# Patient Record
Sex: Female | Born: 1965 | Race: White | Hispanic: No | Smoking: Never smoker
Health system: Southern US, Community
[De-identification: ages and names within clinical notes are randomized; demographics above are authoritative.]

## PROBLEM LIST (undated history)

## (undated) DIAGNOSIS — F603 Borderline personality disorder: Secondary | ICD-10-CM

## (undated) DIAGNOSIS — M25559 Pain in unspecified hip: Secondary | ICD-10-CM

## (undated) DIAGNOSIS — F419 Anxiety disorder, unspecified: Secondary | ICD-10-CM

## (undated) DIAGNOSIS — R413 Other amnesia: Secondary | ICD-10-CM

## (undated) DIAGNOSIS — H9319 Tinnitus, unspecified ear: Secondary | ICD-10-CM

## (undated) DIAGNOSIS — M1990 Unspecified osteoarthritis, unspecified site: Secondary | ICD-10-CM

## (undated) DIAGNOSIS — T7840XA Allergy, unspecified, initial encounter: Secondary | ICD-10-CM

## (undated) DIAGNOSIS — Z8619 Personal history of other infectious and parasitic diseases: Secondary | ICD-10-CM

## (undated) DIAGNOSIS — K5100 Ulcerative (chronic) pancolitis without complications: Secondary | ICD-10-CM

## (undated) DIAGNOSIS — J449 Chronic obstructive pulmonary disease, unspecified: Secondary | ICD-10-CM

## (undated) DIAGNOSIS — R41840 Attention and concentration deficit: Secondary | ICD-10-CM

## (undated) DIAGNOSIS — M2550 Pain in unspecified joint: Secondary | ICD-10-CM

## (undated) DIAGNOSIS — F329 Major depressive disorder, single episode, unspecified: Secondary | ICD-10-CM

## (undated) DIAGNOSIS — F32A Depression, unspecified: Secondary | ICD-10-CM

## (undated) DIAGNOSIS — M549 Dorsalgia, unspecified: Secondary | ICD-10-CM

## (undated) DIAGNOSIS — R600 Localized edema: Secondary | ICD-10-CM

## (undated) DIAGNOSIS — C4491 Basal cell carcinoma of skin, unspecified: Secondary | ICD-10-CM

## (undated) DIAGNOSIS — E559 Vitamin D deficiency, unspecified: Secondary | ICD-10-CM

## (undated) DIAGNOSIS — F50819 Binge eating disorder, unspecified: Secondary | ICD-10-CM

## (undated) DIAGNOSIS — F5081 Binge eating disorder: Secondary | ICD-10-CM

## (undated) DIAGNOSIS — J302 Other seasonal allergic rhinitis: Secondary | ICD-10-CM

## (undated) HISTORY — DX: Major depressive disorder, single episode, unspecified: F32.9

## (undated) HISTORY — DX: Binge eating disorder: F50.81

## (undated) HISTORY — DX: Anxiety disorder, unspecified: F41.9

## (undated) HISTORY — DX: Localized edema: R60.0

## (undated) HISTORY — DX: Chronic obstructive pulmonary disease, unspecified: J44.9

## (undated) HISTORY — DX: Depression, unspecified: F32.A

## (undated) HISTORY — DX: Binge eating disorder, unspecified: F50.819

## (undated) HISTORY — DX: Attention and concentration deficit: R41.840

## (undated) HISTORY — DX: Tinnitus, unspecified ear: H93.19

## (undated) HISTORY — DX: Ulcerative (chronic) pancolitis without complications: K51.00

## (undated) HISTORY — DX: Unspecified osteoarthritis, unspecified site: M19.90

## (undated) HISTORY — PX: COLONOSCOPY: SHX174

## (undated) HISTORY — DX: Pain in unspecified joint: M25.50

## (undated) HISTORY — DX: Allergy, unspecified, initial encounter: T78.40XA

## (undated) HISTORY — DX: Pain in unspecified hip: M25.559

## (undated) HISTORY — DX: Personal history of other infectious and parasitic diseases: Z86.19

## (undated) HISTORY — DX: Dorsalgia, unspecified: M54.9

## (undated) HISTORY — PX: CHOLECYSTECTOMY: SHX55

## (undated) HISTORY — DX: Basal cell carcinoma of skin, unspecified: C44.91

## (undated) HISTORY — DX: Vitamin D deficiency, unspecified: E55.9

## (undated) HISTORY — DX: Borderline personality disorder: F60.3

## (undated) HISTORY — DX: Other amnesia: R41.3

## (undated) HISTORY — DX: Other seasonal allergic rhinitis: J30.2

---

## 2001-01-11 HISTORY — PX: CHOLECYSTECTOMY: SHX55

## 2007-06-22 ENCOUNTER — Ambulatory Visit: Payer: Self-pay | Admitting: Family Medicine

## 2009-08-20 ENCOUNTER — Ambulatory Visit: Payer: Self-pay | Admitting: Family Medicine

## 2009-09-01 ENCOUNTER — Ambulatory Visit: Payer: Self-pay | Admitting: Family Medicine

## 2011-03-11 ENCOUNTER — Ambulatory Visit: Payer: Self-pay | Admitting: Family Medicine

## 2011-03-22 ENCOUNTER — Ambulatory Visit: Payer: Self-pay | Admitting: Family Medicine

## 2012-03-13 ENCOUNTER — Ambulatory Visit: Payer: Self-pay | Admitting: Family Medicine

## 2014-07-02 ENCOUNTER — Other Ambulatory Visit: Payer: Self-pay | Admitting: Family Medicine

## 2014-07-02 DIAGNOSIS — Z1231 Encounter for screening mammogram for malignant neoplasm of breast: Secondary | ICD-10-CM

## 2014-07-16 ENCOUNTER — Ambulatory Visit: Payer: Self-pay

## 2014-07-22 ENCOUNTER — Ambulatory Visit
Admission: RE | Admit: 2014-07-22 | Discharge: 2014-07-22 | Disposition: A | Discharge status: Home / Self Care | Payer: BLUE CROSS/BLUE SHIELD | Source: Ambulatory Visit | Attending: Family Medicine | Admitting: Family Medicine

## 2014-07-22 DIAGNOSIS — Z1231 Encounter for screening mammogram for malignant neoplasm of breast: Secondary | ICD-10-CM

## 2014-07-23 ENCOUNTER — Other Ambulatory Visit: Payer: Self-pay | Admitting: Family Medicine

## 2014-07-23 DIAGNOSIS — N6489 Other specified disorders of breast: Secondary | ICD-10-CM

## 2014-07-23 DIAGNOSIS — R928 Other abnormal and inconclusive findings on diagnostic imaging of breast: Secondary | ICD-10-CM

## 2014-07-25 ENCOUNTER — Ambulatory Visit
Admission: RE | Admit: 2014-07-25 | Discharge: 2014-07-25 | Disposition: A | Discharge status: Home / Self Care | Payer: BLUE CROSS/BLUE SHIELD | Source: Ambulatory Visit | Attending: Family Medicine | Admitting: Family Medicine

## 2014-07-25 ENCOUNTER — Ambulatory Visit: Payer: BLUE CROSS/BLUE SHIELD

## 2014-07-25 DIAGNOSIS — N6489 Other specified disorders of breast: Secondary | ICD-10-CM

## 2014-07-25 DIAGNOSIS — R928 Other abnormal and inconclusive findings on diagnostic imaging of breast: Secondary | ICD-10-CM | POA: Diagnosis not present

## 2017-04-15 ENCOUNTER — Ambulatory Visit: Payer: BLUE CROSS/BLUE SHIELD | Admitting: Physician Assistant

## 2017-04-15 ENCOUNTER — Encounter: Payer: Self-pay | Admitting: Physician Assistant

## 2017-04-15 VITALS — BP 129/86 | HR 91 | Temp 98.3°F | Resp 17 | Ht 67.0 in | Wt 268.0 lb

## 2017-04-15 DIAGNOSIS — R6883 Chills (without fever): Secondary | ICD-10-CM | POA: Diagnosis not present

## 2017-04-15 DIAGNOSIS — R05 Cough: Secondary | ICD-10-CM

## 2017-04-15 DIAGNOSIS — R6889 Other general symptoms and signs: Secondary | ICD-10-CM | POA: Diagnosis not present

## 2017-04-15 DIAGNOSIS — R059 Cough, unspecified: Secondary | ICD-10-CM

## 2017-04-15 LAB — POC INFLUENZA A&B (BINAX/QUICKVUE)
Influenza A, POC: NEGATIVE
Influenza B, POC: NEGATIVE

## 2017-04-15 MED ORDER — AZITHROMYCIN 250 MG PO TABS: ORAL_TABLET | ORAL | 0 refills | Status: DC

## 2017-04-15 MED ORDER — HYDROCOD POLST-CPM POLST ER 10-8 MG/5ML PO SUER: 5.0000 mL | Freq: Two times a day (BID) | ORAL | 0 refills | Status: DC | PRN

## 2017-04-15 MED ORDER — MUCINEX DM MAXIMUM STRENGTH 60-1200 MG PO TB12: 1.0000 | ORAL_TABLET | Freq: Two times a day (BID) | ORAL | 1 refills | Status: DC

## 2017-04-15 NOTE — Patient Instructions (Addendum)
  Start taking Mucinex every 12 hours for the next 4-5 days.  Tussionex may make you drowsy - take this only as directed.  You may fill Azithromycin on Monday only if your symptoms are worsening.   Stay well hydrated. Get lost of rest. Wash your hands often.   -Foods that can help speed recovery: honey, garlic, chicken soup, elderberries, green tea.  -Supplements that can help speed recovery: vitamin C, zinc, elderberry extract, quercetin, ginseng, selenium -Supplement with prebiotics and probiotics.   Advil or ibuprofen for pain. Do not take Aspirin.  Drink enough water and fluids to keep your urine clear or pale yellow.  For sore throat try using a honey-based tea. Use 3 teaspoons of honey with juice squeezed from half lemon. Place shaved pieces of ginger into 1/2-1 cup of water and warm over stove top. Then mix the ingredients and repeat every 4 hours as needed.  Cough Syrup Recipe: Sweet Lemon & Honey Thyme  Ingredients a handful of fresh thyme sprigs   1 pint of water (2 cups)  1/2 cup honey (raw is best, but regular will do)  1/2 lemon chopped Instructions 1. Place the lemon in the pint jar and cover with the honey. The honey will macerate the lemons and draw out liquids which taste so delicious! 2. Meanwhile, toss the thyme leaves into a saucepan and cover them with the water. 3. Bring the water to a gentle simmer and reduce it to half, about a cup of tea. 4. When the tea is reduced and cooled a bit, strain the sprigs & leaves, add it into the pint jar and stir it well. 5. Give it a shake and use a spoonful as needed. 6. Store your homemade cough syrup in the refrigerator for about a month.  Is there anything I can do on my own to get rid of my cough? Yes. To help get rid of your cough, you can: ?Use a humidifier in your bedroom ?Use an over-the-counter cough medicine, or suck on cough drops or hard candy ?Stop smoking, if you smoke ?If you have allergies, avoid the things  you are allergic to (like pollen, dust, animals, or mold) If you have acid reflux, your doctor or nurse will tell you which lifestyle changes can help reduce symptoms.  Thank you for coming in today. I hope you feel we met your needs.  Feel free to call PCP if you have any questions or further requests.  Please consider signing up for MyChart if you do not already have it, as this is a great way to communicate with me.  Best,  Whitney McVey, PA-C    IF you received an x-ray today, you will receive an invoice from [facility]. Please contact [facility] at [phone] with questions or concerns regarding your invoice.   IF you received labwork today, you will receive an invoice from [place]. Please contact [facility] at [phone] with questions or concerns regarding your invoice.   Our billing staff will not be able to assist you with questions regarding bills from these companies.  You will be contacted with the lab results as soon as they are available. The fastest way to get your results is to activate your My Chart account. Instructions are located on the last page of this paperwork. If you have not heard from [name] regarding the results in 2 weeks, please contact this office.

## 2017-04-15 NOTE — Progress Notes (Signed)
   Leslie Douglas  MRN: [id number] DOB: Dec 10, 1965  PCP: Patient, No Pcp Per  Subjective:  Pt is a 52 year old female presents to clinic for cold symptoms x 5 days. C/o cough, runny nose, fatigue, chills and sweats. Symptoms worsened yesterday. Chills last night. She has taken cold and flu otc medications, delsym. She has lots of sick contacts at work.   Review of Systems  Constitutional: Positive for chills and fatigue. Negative for diaphoresis and fever.  HENT: Positive for congestion, rhinorrhea, sinus pressure and sore throat. Negative for postnasal drip and sinus pain.   Respiratory: Positive for cough. Negative for shortness of breath and wheezing.   Psychiatric/Behavioral: Negative for sleep disturbance.    There are no active problems to display for this patient.   No current outpatient medications on file prior to visit.   No current facility-administered medications on file prior to visit.     Allergies  Allergen Reactions  . Erythromycin Shortness Of Breath  . Sulfa Antibiotics Shortness Of Breath  . Asa [Aspirin] Swelling  . Penicillins Rash     Objective:  BP 129/86   Pulse 91   Temp 98.3 F (36.8 C) (Oral)   Resp 17   Ht 5' 7"  (1.702 m)   Wt 268 lb (121.6 kg)   SpO2 98%   BMI 41.97 kg/m   Physical Exam  Constitutional: She is oriented to person, place, and time and well-developed, well-nourished, and in no distress. No distress.  HENT:  Right Ear: Tympanic membrane normal.  Left Ear: Tympanic membrane normal.  Nose: Mucosal edema present. No rhinorrhea. Right sinus exhibits no maxillary sinus tenderness and no frontal sinus tenderness. Left sinus exhibits no maxillary sinus tenderness and no frontal sinus tenderness.  Mouth/Throat: Oropharynx is clear and moist and mucous membranes are normal.  Cardiovascular: Normal rate, regular rhythm and normal heart sounds.  Pulmonary/Chest: Effort normal and breath sounds normal. No respiratory distress. She  has no wheezes. She has no rales.  Neurological: She is alert and oriented to person, place, and time. GCS score is 15.  Skin: Skin is warm and dry.  Psychiatric: Mood, memory, affect and judgment normal.  Vitals reviewed.  Results for orders placed or performed in visit on 04/15/17  POC Influenza A&B(BINAX/QUICKVUE)  Result Value Ref Range   Influenza A, POC Negative Negative   Influenza B, POC Negative Negative   Assessment and Plan :  1. Cough 2. Chills 3. Flu-like symptoms - POC Influenza A&B(BINAX/QUICKVUE) - chlorpheniramine-HYDROcodone (TUSSIONEX PENNKINETIC ER) 10-8 MG/5ML SUER; Take 5 mLs by mouth every 12 (twelve) hours as needed for cough.  Dispense: 100 mL; Refill: 0 - Dextromethorphan-guaiFENesin (MUCINEX DM MAXIMUM STRENGTH) 60-1200 MG TB12; Take 1 tablet by mouth every 12 (twelve) hours.  Dispense: 14 each; Refill: 1 - azithromycin (ZITHROMAX) 250 MG tablet; Take 2 tabs PO x 1 dose, then 1 tab PO QD x 4 days  Dispense: 6 tablet; Refill: 0 - Pt c/o flu-like symptoms x 5 days. Rapid flu is negative. Plan to treat supportively over the next 3 days. She may fill Rx for azithromycin if symptoms worsen. Stay well hydrated. RTC PRN. She understands and agrees with plan.   Mercer Pod, PA-C  [facility] [place] 04/15/2017 12:09 PM

## 2017-10-11 HISTORY — PX: MOHS SURGERY: SUR867

## 2017-10-20 ENCOUNTER — Ambulatory Visit: Payer: BLUE CROSS/BLUE SHIELD | Admitting: Physician Assistant

## 2017-10-20 ENCOUNTER — Other Ambulatory Visit: Payer: Self-pay

## 2017-10-20 ENCOUNTER — Encounter: Payer: Self-pay | Admitting: Physician Assistant

## 2017-10-20 ENCOUNTER — Other Ambulatory Visit: Payer: Self-pay | Admitting: Physician Assistant

## 2017-10-20 VITALS — BP 136/85 | HR 86 | Temp 98.0°F | Resp 18 | Ht 67.0 in | Wt 282.6 lb

## 2017-10-20 DIAGNOSIS — F50819 Binge eating disorder, unspecified: Secondary | ICD-10-CM | POA: Insufficient documentation

## 2017-10-20 DIAGNOSIS — Z1329 Encounter for screening for other suspected endocrine disorder: Secondary | ICD-10-CM | POA: Diagnosis not present

## 2017-10-20 DIAGNOSIS — C4491 Basal cell carcinoma of skin, unspecified: Secondary | ICD-10-CM | POA: Insufficient documentation

## 2017-10-20 DIAGNOSIS — Z1321 Encounter for screening for nutritional disorder: Secondary | ICD-10-CM | POA: Diagnosis not present

## 2017-10-20 DIAGNOSIS — Z130 Encounter for screening for diseases of the blood and blood-forming organs and certain disorders involving the immune mechanism: Secondary | ICD-10-CM

## 2017-10-20 DIAGNOSIS — Z0000 Encounter for general adult medical examination without abnormal findings: Secondary | ICD-10-CM

## 2017-10-20 DIAGNOSIS — N898 Other specified noninflammatory disorders of vagina: Secondary | ICD-10-CM | POA: Diagnosis not present

## 2017-10-20 DIAGNOSIS — Z1211 Encounter for screening for malignant neoplasm of colon: Secondary | ICD-10-CM

## 2017-10-20 DIAGNOSIS — Z124 Encounter for screening for malignant neoplasm of cervix: Secondary | ICD-10-CM | POA: Diagnosis not present

## 2017-10-20 DIAGNOSIS — Z1231 Encounter for screening mammogram for malignant neoplasm of breast: Secondary | ICD-10-CM

## 2017-10-20 DIAGNOSIS — A6000 Herpesviral infection of urogenital system, unspecified: Secondary | ICD-10-CM

## 2017-10-20 DIAGNOSIS — F418 Other specified anxiety disorders: Secondary | ICD-10-CM | POA: Insufficient documentation

## 2017-10-20 DIAGNOSIS — Z23 Encounter for immunization: Secondary | ICD-10-CM | POA: Diagnosis not present

## 2017-10-20 DIAGNOSIS — Z13228 Encounter for screening for other metabolic disorders: Secondary | ICD-10-CM

## 2017-10-20 DIAGNOSIS — F5081 Binge eating disorder: Secondary | ICD-10-CM | POA: Insufficient documentation

## 2017-10-20 DIAGNOSIS — H539 Unspecified visual disturbance: Secondary | ICD-10-CM

## 2017-10-20 LAB — POCT WET + KOH PREP
Trich by wet prep: ABSENT
Yeast by KOH: ABSENT
Yeast by wet prep: ABSENT

## 2017-10-20 LAB — POCT URINALYSIS DIP (MANUAL ENTRY)
Bilirubin, UA: NEGATIVE
Glucose, UA: NEGATIVE mg/dL
Ketones, POC UA: NEGATIVE mg/dL
Leukocytes, UA: NEGATIVE
Nitrite, UA: NEGATIVE
Protein Ur, POC: NEGATIVE mg/dL
Spec Grav, UA: 1.015 (ref 1.010–1.025)
Urobilinogen, UA: 0.2 E.U./dL
pH, UA: 5.5 (ref 5.0–8.0)

## 2017-10-20 MED ORDER — METRONIDAZOLE 0.75 % VA GEL: 1.0000 | Freq: Every day | VAGINAL | 0 refills | Status: AC

## 2017-10-20 MED ORDER — VALACYCLOVIR HCL 500 MG PO TABS: 500.0000 mg | ORAL_TABLET | Freq: Two times a day (BID) | ORAL | 2 refills | Status: AC

## 2017-10-20 MED ORDER — ESCITALOPRAM OXALATE 10 MG PO TABS: 10.0000 mg | ORAL_TABLET | Freq: Every day | ORAL | 1 refills | Status: DC

## 2017-10-20 NOTE — Patient Instructions (Addendum)
Your test result is negative for yeast or bacterial vaginosis. If you continue to have symptoms of vaginal irritation, I sent in a prescription for metrogel to your pharmacy. Apply 1 suppository vaginally at night before bed for 5 nights.   1) You will receive a phone call to schedule an appointment for your colonoscopy and to see opthalmologist  2) We recommend that you schedule a mammogram for breast cancer screening. Typically, you do not need a referral to do this. Please contact a local imaging center to schedule your mammogram.  [facility] ([place]) - [phone] or [phone]  [facility] - [phone] [place] [phone] [place] [phone]  *ask for the Radiology Department [place] - [phone]  3) For herpes: Valacyclovir 500 mg twice daily for 3 days   4) For therapy (specifically related to eating disorders): [facility] - [place], [phone]  Nutritionist: [facility]  5) Start Lexapro 10 mg once daily (morning or evening, with or without food). After 2 weeks you can increase the dose to 48m if you feel like you need it.  It may take Lexapro a week or two to take effect.  (Please know you need to take this medication every day. Do not miss a dose. Plan to continue taking this medication at least 12 months. When you and your medical provider decide it's appropriate, you will need to taper off this medication.). Common side effects of SSRIs include, but are not limited to, GI upset, nausea, vomiting, insomnia, and drowsiness. Typically these side effects will resolve after 2 weeks.  Come back and see me in 4 weeks to recheck.      If you have lab work done today you will be contacted with your lab results within the next 2 weeks.  If you have not heard from [place]  please contact [place] The fastest way to get your results is to register for My Chart.   Colorectal Cancer Screening Colorectal cancer screening is a group of tests used to check for colorectal cancer. Colorectal refers to your colon and rectum. Your colon and rectum are located at the end of your large intestine and carry your bowel movements out of your body. Why is colorectal cancer screening done? It is common for abnormal growths (polyps) to form in the lining of your colon, especially as you get older. These polyps can be cancerous or become cancerous. If colorectal cancer is found at an early stage, it is treatable. Who should be screened for colorectal cancer? Screening is recommended for all adults at average risk starting at age 54 Tests may be recommended every 1 to 10 years. Your health care provider may recommend earlier or more frequent screening if you have:  A history of colorectal cancer or polyps.  A family member with a history of colorectal cancer or polyps.  Inflammatory bowel disease, such as ulcerative colitis or Crohn disease.  A type of hereditary colon cancer syndrome.  Colorectal cancer symptoms.  Types of screening tests There are several types of colorectal screening tests. They include:  Guaiac-based fecal occult blood testing.  Fecal immunochemical test (FIT).  Stool DNA test.  Barium enema.  Virtual colonoscopy.  Sigmoidoscopy. During this test, a sigmoidoscope is used to examine your rectum and lower colon. A sigmoidoscope is a flexible tube with a camera  that is inserted through your anus into your rectum and lower colon.  Colonoscopy. During this test, a colonoscope is used to examine your entire colon. A colonoscope is a long, thin, flexible tube with a camera. This test examines your entire colon and rectum.  This information is not intended to replace advice given to you by your health care provider. Make sure you discuss any questions you have  with your health care provider. Document Released: 06/17/2009 Document Revised: 08/07/2015 Document Reviewed: 04/05/2013 Elsevier Interactive Patient Education  2018 [employer].  IF you received an x-ray today, you will receive an invoice from [facility]. Please contact [facility] Radiology at [phone] with questions or concerns regarding your invoice.   IF you received labwork today, you will receive an invoice from [place]. Please contact [facility] at [phone] with questions or concerns regarding your invoice.   Our billing staff will not be able to assist you with questions regarding bills from these companies.  You will be contacted with the lab results as soon as they are available. The fastest way to get your results is to activate your My Chart account. Instructions are located on the last page of this paperwork. If you have not heard from [name] regarding the results in 2 weeks, please contact this office.

## 2017-10-20 NOTE — Progress Notes (Signed)
[facility] [place], [place] [address] [phone]  Date:  10/20/2017   Name:  Leslie Douglas   DOB:  May 12, 1965   MRN:  [id number]  PCP:  Patient, No Pcp Per    Chief Complaint: Annual Exam   History of Present Illness:  Leslie Douglas is a 52 y.o. female who presents for Annual Wellness Exam with gyn exam. Used to receive care at [facility] in [place]. Recently moved to [place].   H/o basal cell on nose - Mohs surgery scheduled for next week. Has routine f/u early next year.   Anxiety and depression "I feel paralyzed" H/o binge eating. She feels like she has a h/o this, however has gotten uncontrollable recently. She has never been treated for this problem before. Had a breakdown at work and was advised to be seen "which is a big deal bc I never ask for help". She has never seen therapist before.   Denies hi or si.   Genital herpes x 30 years. She believes she has an outbreak for the past 3 weeks. She is not sexually active.   H/o not seeing well at night. Feels like this is worsening. Has not been evaluated by opthalmology.   Cervical Cancer Screening: 07/02/2014 Breast Cancer Screening: she is due.  Colorectal Cancer Screening: never.  Bone Density Testing: not yet a candidate HIV Screening: negative screening.  STI Screening: declines this today.  Seasonal Influenza Vaccination: already received it.  Td/Tdap Vaccination: not utd Pneumococcal Vaccination: not yet a cadidate Zoster Vaccination: not yet a cadidate Frequency of Dental evaluation: regular Q6 months Frequency of Eye evaluation: regular checks. Last exam last year.    Review of Systems:   Review of Systems  Constitutional: Negative for chills and fever.  Respiratory: Negative for cough, shortness of breath and wheezing.   Cardiovascular: Negative for chest pain and palpitations.  Neurological: Negative for dizziness and headaches.  Psychiatric/Behavioral: Positive for  dysphoric mood. Negative for self-injury and suicidal ideas. The patient is nervous/anxious.     There are no active problems to display for this patient.   Prior to Admission medications   Not on File    Allergies  Allergen Reactions  . Doxycycline Rash and Swelling    Mouth and face  . Erythromycin Shortness Of Breath  . Sulfa Antibiotics Shortness Of Breath  . Asa [Aspirin] Swelling  . Penicillins Rash    No past surgical history on file.  Social History   Tobacco Use  . Smoking status: Never Smoker  . Smokeless tobacco: Never Used  Substance Use Topics  . Alcohol use: Not on file  . Drug use: Not on file    No family history on file.  Medication list has been reviewed and updated.  Physical Examination:  Physical Exam  Constitutional: She is oriented to person, place, and time. She appears well-developed and well-nourished. No distress.  HENT:  Head: Normocephalic and atraumatic.  Mouth/Throat: Oropharynx is clear and moist.  Eyes: Pupils are equal, round, and reactive to light. Conjunctivae and EOM are normal.  Neck: Normal range of motion. Neck supple. No thyromegaly present.  Cardiovascular: Normal rate, regular rhythm and normal heart sounds.  No murmur heard. Pulmonary/Chest: Effort normal and breath sounds normal. She has no wheezes.  Abdominal: Soft. Bowel sounds are normal. She exhibits no mass. There is no tenderness.  Genitourinary: Vagina normal and uterus normal. Cervix exhibits no motion tenderness and no discharge. Right adnexum displays no  mass and no tenderness. Left adnexum displays no mass and no tenderness.  Musculoskeletal: Normal range of motion.  Neurological: She is alert and oriented to person, place, and time. She has normal reflexes.  Skin: Skin is warm and dry.  Psychiatric: She has a normal mood and affect. Her behavior is normal. Judgment and thought content normal. Cognition and memory are normal.  tearful  Vitals  reviewed.   BP 136/85   Pulse 86   Temp 98 F (36.7 C) (Oral)   Resp 18   Ht 5' 7"  (1.702 m)   Wt 282 lb 9.6 oz (128.2 kg)   SpO2 97%   BMI 44.26 kg/m   Results for orders placed or performed in visit on 10/20/17  POCT urinalysis dipstick  Result Value Ref Range   Color, UA yellow yellow   Clarity, UA clear clear   Glucose, UA negative negative mg/dL   Bilirubin, UA negative negative   Ketones, POC UA negative negative mg/dL   Spec Grav, UA 1.015 1.010 - 1.025   Blood, UA trace-lysed (A) negative   pH, UA 5.5 5.0 - 8.0   Protein Ur, POC negative negative mg/dL   Urobilinogen, UA 0.2 0.2 or 1.0 E.U./dL   Nitrite, UA Negative Negative   Leukocytes, UA Negative Negative  POCT Wet + KOH Prep  Result Value Ref Range   Yeast by KOH Absent Absent   Yeast by wet prep Absent Absent   WBC by wet prep Moderate (A) Few   Clue Cells Wet Prep HPF POC None None   Trich by wet prep Absent Absent   Bacteria Wet Prep HPF POC Few Few   Epithelial Cells By [employer] Pref ([employer]) Many (A) None, Few, Too numerous to count   RBC,UR,HPF,POC None None RBC/hpf    Assessment and Plan: 1. Annual physical exam - Pt presents for annual exam. PAP done today. Routine labs are pending. Referral for colonoscopy, information for MM provided. She has Mohs surgery scheduled for next week.  Endorses depression, anxiety and binge eating. Plan to start Lexapro 29m and start seeing therapist - numbers provided. F/u in 4 weeks to recheck. Anticipatory guidance provided.   2. Flu vaccine need - Flu Vaccine QUAD 36+ mos IM  3. Screening for thyroid disorder - TSH  4. Screening for endocrine, metabolic and immunity disorder - COMPLETE METABOLIC PANEL WITH GFR - Lipid panel - POCT urinalysis dipstick - CBC with Differential/Platelet - TSH - Hemoglobin A1c  5. Encounter for vitamin deficiency screening - VITAMIN D 25 Hydroxy (Vit-D Deficiency, Fractures)  6. Screen for colon cancer - Ambulatory referral to  Gastroenterology  7. Cervical cancer screening - Pap IG and HPV (high risk) DNA detection  8. Genital herpes simplex, unspecified site - valACYclovir (VALTREX) 500 MG tablet; Take 1 tablet (500 mg total) by mouth 2 (two) times daily for 3 days.  Dispense: 6 tablet; Refill: 2  9. Vagina itching - Negative. Will treat with metrogel is symptoms con't - POCT Wet + KOH Prep - metroNIDAZOLE (METROGEL VAGINAL) 0.75 % vaginal gel; Place 1 Applicatorful vaginally at bedtime for 5 days.  Dispense: 70 g; Refill: 0  10. Anxiety with depression 11. Binge eating disorder - escitalopram (LEXAPRO) 10 MG tablet; Take 1 tablet (10 mg total) by mouth daily. After 2-3 weeks if needed, increase dose to 273mday.  Dispense: 45 tablet; Refill: 1  12. Vision changes - Ambulatory referral to Ophthalmology   WhMercer PodPA-C  [facility] 10/20/2017 9:07 AM

## 2017-10-21 LAB — CBC WITH DIFFERENTIAL/PLATELET
Basophils Absolute: 0 10*3/uL (ref 0.0–0.2)
Basos: 1 %
EOS (ABSOLUTE): 0.1 10*3/uL (ref 0.0–0.4)
Eos: 1 %
Hematocrit: 37.5 % (ref 34.0–46.6)
Hemoglobin: 12.7 g/dL (ref 11.1–15.9)
Immature Grans (Abs): 0 10*3/uL (ref 0.0–0.1)
Immature Granulocytes: 0 %
Lymphocytes Absolute: 2.6 10*3/uL (ref 0.7–3.1)
Lymphs: 43 %
MCH: 29.4 pg (ref 26.6–33.0)
MCHC: 33.9 g/dL (ref 31.5–35.7)
MCV: 87 fL (ref 79–97)
Monocytes Absolute: 0.3 10*3/uL (ref 0.1–0.9)
Monocytes: 5 %
Neutrophils Absolute: 3.1 10*3/uL (ref 1.4–7.0)
Neutrophils: 50 %
Platelets: 345 10*3/uL (ref 150–450)
RBC: 4.32 x10E6/uL (ref 3.77–5.28)
RDW: 13.1 % (ref 12.3–15.4)
WBC: 6.2 10*3/uL (ref 3.4–10.8)

## 2017-10-21 LAB — LIPID PANEL
Chol/HDL Ratio: 4.2 ratio (ref 0.0–4.4)
Cholesterol, Total: 224 mg/dL — ABNORMAL HIGH (ref 100–199)
HDL: 53 mg/dL (ref >39)
LDL Calculated: 142 mg/dL — ABNORMAL HIGH (ref 0–99)
Triglycerides: 147 mg/dL (ref 0–149)
VLDL Cholesterol Cal: 29 mg/dL (ref 5–40)

## 2017-10-21 LAB — HEMOGLOBIN A1C
Est. average glucose Bld gHb Est-mCnc: 111 mg/dL
Hgb A1c MFr Bld: 5.5 % (ref 4.8–5.6)

## 2017-10-21 LAB — VITAMIN D 25 HYDROXY (VIT D DEFICIENCY, FRACTURES): Vit D, 25-Hydroxy: 22 ng/mL — ABNORMAL LOW (ref 30.0–100.0)

## 2017-10-21 LAB — TSH: TSH: 1.5 u[IU]/mL (ref 0.450–4.500)

## 2017-10-24 LAB — PAP IG AND HPV HIGH-RISK
HPV, high-risk: NEGATIVE
PAP Smear Comment: 0

## 2017-11-02 NOTE — Progress Notes (Signed)
Vitamin D low. Start supplement and calcium. PAP negative. The rest of labs are fine. Results released to mychart.

## 2017-11-21 ENCOUNTER — Encounter: Payer: Self-pay | Admitting: Physician Assistant

## 2017-11-24 ENCOUNTER — Ambulatory Visit (INDEPENDENT_AMBULATORY_CARE_PROVIDER_SITE_OTHER): Payer: BLUE CROSS/BLUE SHIELD | Admitting: Physician Assistant

## 2017-11-24 ENCOUNTER — Other Ambulatory Visit: Payer: Self-pay

## 2017-11-24 ENCOUNTER — Ambulatory Visit (INDEPENDENT_AMBULATORY_CARE_PROVIDER_SITE_OTHER): Payer: BLUE CROSS/BLUE SHIELD

## 2017-11-24 ENCOUNTER — Encounter: Payer: Self-pay | Admitting: Physician Assistant

## 2017-11-24 VITALS — BP 143/96 | HR 85 | Temp 99.3°F | Resp 16 | Ht 66.0 in | Wt 281.6 lb

## 2017-11-24 DIAGNOSIS — M79671 Pain in right foot: Secondary | ICD-10-CM

## 2017-11-24 DIAGNOSIS — F418 Other specified anxiety disorders: Secondary | ICD-10-CM | POA: Diagnosis not present

## 2017-11-24 DIAGNOSIS — S92511A Displaced fracture of proximal phalanx of right lesser toe(s), initial encounter for closed fracture: Secondary | ICD-10-CM

## 2017-11-24 MED ORDER — ESCITALOPRAM OXALATE 20 MG PO TABS: 20.0000 mg | ORAL_TABLET | Freq: Every day | ORAL | 3 refills | Status: DC

## 2017-11-24 NOTE — Patient Instructions (Addendum)
Start taking Lexapro 13m daily. Come back and see me next month to recheck.   You have a fracture of your little toe.  Wear the hard sole shoe for 3-4 weeks to prevent it from bending and further delayed healing. Your foot may take another 6-8 weeks to heal completely.  Put ice in a plastic bag. Place a towel between your skin and the bag or between your plaster splint and the bag. Leave the ice on for 20 minutes, 2-3 times a day. Ibuprofen and/or Tylenol for pain. Keep the area elevated above the level of your heart. Do this 2-3 times a day until swelling improves.    Toe Fracture A toe fracture is a break in one of the toe bones (phalanges)  How is this treated? Treatment for this condition depends on the type of fracture and its severity. Treatment may involve:  Taping the broken toe to a toe that is next to it (buddy taping). This is the most common treatment for fractures in which the bone has not moved out of place (nondisplaced fracture).  Wearing a shoe that has a wide, rigid sole to protect the toe and to limit its movement.  Wearing a walking cast.  Having a procedure to move the toe back into place.  Surgery. This may be needed: ? If there are many pieces of broken bone that are out of place (displaced). ? If the toe joint breaks. ? If the bone breaks through the skin.  Physical therapy. This is done to help regain movement and strength in the toe.  You may need follow-up X-rays to make sure that the bone is healing well and staying in position. Follow these instructions at home: If you have a cast:  Do not stick anything inside the cast to scratch your skin. Doing that increases your risk of infection.  Check the skin around the cast every day. Report any concerns to your health care provider. You may put lotion on dry skin around the edges of the cast. Do not apply lotion to the skin underneath the cast.  Do not put pressure on any part of the cast until it is  fully hardened. This may take several hours.  Keep the cast clean and dry. Bathing  Do not take baths, swim, or use a hot tub until your health care provider approves. Ask your health care provider if you can take showers. You may only be allowed to take sponge baths for bathing.  If your health care provider approves bathing and showering, cover the cast or bandage (dressing) with a watertight plastic bag to protect it from water. Do not let the cast or dressing get wet. Managing pain, stiffness, and swelling  If you do not have a cast, apply ice to the injured area, if directed. ? Put ice in a plastic bag. ? Place a towel between your skin and the bag. ? Leave the ice on for 20 minutes, 2-3 times per day.  Move your toes often to avoid stiffness and to lessen swelling.  Raise (elevate) the injured area above the level of your heart while you are sitting or lying down. Driving  Do not drive or operate heavy machinery while taking pain medicine.  Do not drive while wearing a cast on a foot that you use for driving. Activity  Return to your normal activities as directed by your health care provider. Ask your health care provider what activities are safe for you.  Perform exercises daily  as directed by your health care provider or physical therapist. Safety  Do not use the injured limb to support your body weight until your health care provider says that you can. Use crutches or other assistive devices as directed by your health care provider. General instructions  If your toe was treated with buddy taping, follow your health care provider's instructions for changing the gauze and tape. Change it more often: ? The gauze and tape get wet. If this happens, dry the space between the toes. ? The gauze and tape are too tight and cause your toe to become pale or numb.  Wear a protective shoe as directed by your health care provider. If you were not given a protective shoe, wear sturdy,  supportive shoes. Your shoes should not pinch your toes and should not fit tightly against your toes.  Do not use any tobacco products, including cigarettes, chewing tobacco, or e-cigarettes. Tobacco can delay bone healing. If you need help quitting, ask your health care provider.  Take medicines only as directed by your health care provider.  Keep all follow-up visits as directed by your health care provider. This is important. Contact a health care provider if:  You have a fever.  Your pain medicine is not helping.  Your toe is cold.  Your toe is numb.  You still have pain after one week of rest and treatment.  You still have pain after your health care provider has said that you can start walking again.  You have pain, tingling, or numbness in your foot that is not going away. Get help right away if:  You have severe pain.  You have redness or inflammation in your toe that is getting worse.  You have pain or numbness in your toe that is getting worse.  Your toe turns blue. This information is not intended to replace advice given to you by your health care provider. Make sure you discuss any questions you have with your health care provider. Document Released: 12/26/1999 Document Revised: 09/01/2015 Document Reviewed: 10/24/2013 Elsevier Interactive Patient Education  [employer].   Thank you for coming in today. I hope you feel we met your needs.  Feel free to call PCP if you have any questions or further requests.  Please consider signing up for MyChart if you do not already have it, as this is a great way to communicate with me.  Best,  [employer], PA-C
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
## 2017-11-28 ENCOUNTER — Ambulatory Visit
Admission: RE | Admit: 2017-11-28 | Discharge: 2017-11-28 | Disposition: A | Discharge status: Home / Self Care | Payer: BLUE CROSS/BLUE SHIELD | Source: Ambulatory Visit | Attending: Physician Assistant | Admitting: Physician Assistant

## 2017-11-28 DIAGNOSIS — Z1231 Encounter for screening mammogram for malignant neoplasm of breast: Secondary | ICD-10-CM

## 2017-12-06 ENCOUNTER — Telehealth: Payer: Self-pay | Admitting: Physician Assistant

## 2017-12-06 NOTE — Telephone Encounter (Signed)
Tried to call pt to reschedule her appt on 12/21/17 with McV[facility]. Due to provider schedule change, we will need to reschedule. If pt happens to cll in, please reschedule at pts convenience. Thank you!

## 2017-12-11 HISTORY — PX: WISDOM TOOTH EXTRACTION: SHX21

## 2017-12-21 ENCOUNTER — Ambulatory Visit: Payer: BLUE CROSS/BLUE SHIELD | Admitting: Physician Assistant

## 2017-12-22 ENCOUNTER — Ambulatory Visit: Payer: BLUE CROSS/BLUE SHIELD | Admitting: Physician Assistant

## 2017-12-22 ENCOUNTER — Encounter: Payer: Self-pay | Admitting: Physician Assistant

## 2017-12-22 ENCOUNTER — Other Ambulatory Visit: Payer: Self-pay

## 2017-12-22 VITALS — BP 137/88 | HR 76 | Temp 98.7°F | Resp 16 | Ht 67.0 in | Wt 283.6 lb

## 2017-12-22 DIAGNOSIS — F418 Other specified anxiety disorders: Secondary | ICD-10-CM

## 2017-12-22 DIAGNOSIS — J329 Chronic sinusitis, unspecified: Secondary | ICD-10-CM

## 2017-12-22 MED ORDER — PREDNISONE 20 MG PO TABS: ORAL_TABLET | ORAL | 0 refills | Status: DC

## 2017-12-22 MED ORDER — ESCITALOPRAM OXALATE 20 MG PO TABS: 20.0000 mg | ORAL_TABLET | Freq: Every day | ORAL | 3 refills | Status: DC

## 2017-12-22 NOTE — Progress Notes (Signed)
Leslie Douglas  MRN: [id number] DOB: [birth year]  PCP: Dorise Hiss, PA-C  Subjective:  Pt 52 year old female who presents to clinic for f/u depression.  Last OV for this problem was 11/14 - increase dose to Lexapro 2m qd.  She had nausea, depression and diarrhea for the past 3 weeks. Then symptoms resolved completely. On Lexapro she is feeling about 75% better than she felt when she was not taking it. "I hope this is the new normal".  Denies HI or SI.  She plans to see therapist specializing in eating disorders, [facility]   C/o left sinus tenderness x 3-4 weeks. Worse when she pressed it. She has not taken anything to feel better.  Endorses nasal drainage from left side.    Review of Systems  HENT: Positive for postnasal drip, rhinorrhea, sinus pressure and sinus pain. Negative for congestion, ear discharge and ear pain.   Respiratory: Negative for cough and wheezing.   Gastrointestinal: Negative for abdominal pain, nausea and vomiting.  Psychiatric/Behavioral: Positive for dysphoric mood. Negative for self-injury, sleep disturbance and suicidal ideas. The patient is not nervous/anxious.     Patient Active Problem List   Diagnosis Date Noted  . Basal cell carcinoma 10/20/2017  . Anxiety with depression 10/20/2017  . Binge eating disorder 10/20/2017    Current Outpatient Medications on File Prior to Visit  Medication Sig Dispense Refill  . Biotin w/ Vitamins C & E (HAIR/SKIN/NAILS PO) Take by mouth.    . cholecalciferol (VITAMIN D3) 25 MCG (1000 UT) tablet Take 1,000 Units by mouth daily.    .Marland Kitchenescitalopram (LEXAPRO) 20 MG tablet Take 1 tablet (20 mg total) by mouth daily. After 2-3 weeks if needed, increase dose to 249mday. 30 tablet 3  . Multiple Vitamin (MULTIVITAMIN) tablet Take 1 tablet by mouth daily.     No current facility-administered medications on file prior to visit.     Allergies  Allergen Reactions  . Doxycycline Rash and Swelling   Mouth and face  . Erythromycin Shortness Of Breath  . Sulfa Antibiotics Shortness Of Breath  . Asa [Aspirin] Swelling  . Penicillins Rash     Objective:  BP 137/88 (BP Location: Right Arm, Patient Position: Standing, Cuff Size: Large)   Pulse 76   Temp 98.7 F (37.1 C) (Oral)   Resp 16   Ht 5' 7"  (1.702 m)   Wt 283 lb 9.6 oz (128.6 kg)   SpO2 96%   BMI 44.42 kg/m   Physical Exam Vitals signs reviewed.  Constitutional:      General: She is not in acute distress. HENT:     Right Ear: Tympanic membrane normal.     Left Ear: Tympanic membrane normal.     Nose: Mucosal edema present. No rhinorrhea.     Right Sinus: No maxillary sinus tenderness or frontal sinus tenderness.     Left Sinus: Maxillary sinus tenderness present. No frontal sinus tenderness.  Cardiovascular:     Rate and Rhythm: Normal rate and regular rhythm.     Heart sounds: Normal heart sounds.  Pulmonary:     Effort: Pulmonary effort is normal. No respiratory distress.     Breath sounds: Normal breath sounds. No wheezing or rales.  Skin:    General: Skin is warm and dry.  Neurological:     Mental Status: She is alert and oriented to person, place, and time.  Psychiatric:        Judgment: Judgment normal.  Assessment and Plan :  1. Anxiety with depression - controlled. Continue Lexapro 69m qd. Denies HI or SI. She plans to start seeing therapist in the beginning of next year.  - escitalopram (LEXAPRO) 20 MG tablet; Take 1 tablet (20 mg total) by mouth daily.  Dispense: 90 tablet; Refill: 3  2. Sinusitis, unspecified chronicity, unspecified location - Viral vs bacterial. No red flags. Will try prednisone, then try antibiotic if no improvement.  - predniSONE (DELTASONE) 20 MG tablet; Take 40 mg x 5 days then 20 mg x 5 days.  Dispense: 15 tablet; Refill: 0    WMercer Pod PA-C  [facility]/12/2017 2:05 PM  Please note: Portions of this report may have  been transcribed using dragon voice recognition software. Every effort was made to ensure accuracy; however, inadvertent computerized transcription errors may be present.

## 2017-12-22 NOTE — Patient Instructions (Addendum)
Start taking prednisone 40 mg for 5 days, then 49m for 5 days.  Let me know if you are still not improving and we can try an antibiotic.  Keep your appointment with ophthalmologist next month.    Prednisone tablets What is this medicine? PREDNISONE (PRED ni sone) is a corticosteroid. It is commonly used to treat inflammation of the skin, joints, lungs, and other organs. Common conditions treated include asthma, allergies, and arthritis. It is also used for other conditions, such as blood disorders and diseases of the adrenal glands. This medicine may be used for other purposes; ask your health care provider or pharmacist if you have questions. COMMON BRAND NAME(S): Deltasone, Predone, Sterapred, Sterapred DS What should I tell my health care provider before I take this medicine? They need to know if you have any of these conditions: -Cushing's syndrome -diabetes -glaucoma -heart disease -high blood pressure -infection (especially a virus infection such as chickenpox, cold sores, or herpes) -kidney disease -liver disease -mental illness -myasthenia gravis -osteoporosis -seizures -stomach or intestine problems -thyroid disease -an unusual or allergic reaction to lactose, prednisone, other medicines, foods, dyes, or preservatives -pregnant or trying to get pregnant -breast-feeding How should I use this medicine? Take this medicine by mouth with a glass of water. Follow the directions on the prescription label. Take this medicine with food. If you are taking this medicine once a day, take it in the morning. Do not take more medicine than you are told to take. Do not suddenly stop taking your medicine because you may develop a severe reaction. Your doctor will tell you how much medicine to take. If your doctor wants you to stop the medicine, the dose may be slowly lowered over time to avoid any side effects. Talk to your pediatrician regarding the use of this medicine in children.  Special care may be needed. Overdosage: If you think you have taken too much of this medicine contact a poison control center or emergency room at once. NOTE: This medicine is only for you. Do not share this medicine with others. What if I miss a dose? If you miss a dose, take it as soon as you can. If it is almost time for your next dose, talk to your doctor or health care professional. You may need to miss a dose or take an extra dose. Do not take double or extra doses without advice. What may interact with this medicine? Do not take this medicine with any of the following medications: -metyrapone -mifepristone This medicine may also interact with the following medications: -aminoglutethimide -amphotericin B -aspirin and aspirin-like medicines -barbiturates -certain medicines for diabetes, like glipizide or glyburide -cholestyramine -cholinesterase inhibitors -cyclosporine -digoxin -diuretics -ephedrine -female hormones, like estrogens and birth control pills -isoniazid -ketoconazole -NSAIDS, medicines for pain and inflammation, like ibuprofen or naproxen -phenytoin -rifampin -toxoids -vaccines -warfarin This list may not describe all possible interactions. Give your health care provider a list of all the medicines, herbs, non-prescription drugs, or dietary supplements you use. Also tell them if you smoke, drink alcohol, or use illegal drugs. Some items may interact with your medicine. What should I watch for while using this medicine? Visit your doctor or health care professional for regular checks on your progress. If you are taking this medicine over a prolonged period, carry an identification card with your name and address, the type and dose of your medicine, and your doctor's name and address. This medicine may increase your risk of getting an infection. Tell your doctor  or health care professional if you are around anyone with measles or chickenpox, or if you develop sores  or blisters that do not heal properly. If you are going to have surgery, tell your doctor or health care professional that you have taken this medicine within the last twelve months. Ask your doctor or health care professional about your diet. You may need to lower the amount of salt you eat. This medicine may affect blood sugar levels. If you have diabetes, check with your doctor or health care professional before you change your diet or the dose of your diabetic medicine. What side effects may I notice from receiving this medicine? Side effects that you should report to your doctor or health care professional as soon as possible: -allergic reactions like skin rash, itching or hives, swelling of the face, lips, or tongue -changes in emotions or moods -changes in vision -depressed mood -eye pain -fever or chills, cough, sore throat, pain or difficulty passing urine -increased thirst -swelling of ankles, feet Side effects that usually do not require medical attention (report to your doctor or health care professional if they continue or are bothersome): -confusion, excitement, restlessness -headache -nausea, vomiting -skin problems, acne, thin and shiny skin -trouble sleeping -weight gain This list may not describe all possible side effects. Call your doctor for medical advice about side effects. You may report side effects to FDA at 1-800-FDA-1088. Where should I keep my medicine? Keep out of the reach of children. Store at room temperature between 15 and 30 degrees C (59 and 86 degrees F). Protect from light. Keep container tightly closed. Throw away any unused medicine after the expiration date. NOTE: This sheet is a summary. It may not cover all possible information. If you have questions about this medicine, talk to your doctor, pharmacist, or health care provider.  2018 Elsevier/Gold Standard (2010-08-13 10:57:14)

## 2018-02-02 ENCOUNTER — Encounter: Payer: Self-pay | Admitting: Gastroenterology

## 2018-02-08 ENCOUNTER — Encounter: Payer: Self-pay | Admitting: Gastroenterology

## 2018-02-08 ENCOUNTER — Ambulatory Visit (AMBULATORY_SURGERY_CENTER): Payer: Self-pay

## 2018-02-08 ENCOUNTER — Other Ambulatory Visit: Payer: Self-pay

## 2018-02-08 VITALS — Ht 67.0 in | Wt 280.4 lb

## 2018-02-08 DIAGNOSIS — Z1211 Encounter for screening for malignant neoplasm of colon: Secondary | ICD-10-CM

## 2018-02-08 MED ORDER — PEG 3350-KCL-NA BICARB-NACL 420 G PO SOLR: 4000.0000 mL | Freq: Once | ORAL | 0 refills | Status: AC

## 2018-02-08 NOTE — Progress Notes (Signed)
No egg or soy allergy known to patient  No issues with past sedation with any surgeries  or procedures, no intubation problems  No diet pills per patient No home 02 use per patient  No blood thinners per patient  Pt denies issues with constipation , pt having chronic diarrhea No A fib or A flutter  EMMI video sent to pt's e mail

## 2018-02-21 ENCOUNTER — Encounter: Payer: Self-pay | Admitting: Gastroenterology

## 2018-02-21 ENCOUNTER — Ambulatory Visit (AMBULATORY_SURGERY_CENTER): Payer: BLUE CROSS/BLUE SHIELD | Admitting: Gastroenterology

## 2018-02-21 VITALS — BP 139/77 | HR 71 | Temp 98.9°F | Resp 13 | Ht 67.0 in | Wt 283.0 lb

## 2018-02-21 DIAGNOSIS — Z1211 Encounter for screening for malignant neoplasm of colon: Secondary | ICD-10-CM

## 2018-02-21 DIAGNOSIS — K5289 Other specified noninfective gastroenteritis and colitis: Secondary | ICD-10-CM | POA: Diagnosis not present

## 2018-02-21 DIAGNOSIS — K51919 Ulcerative colitis, unspecified with unspecified complications: Secondary | ICD-10-CM

## 2018-02-21 MED ORDER — SODIUM CHLORIDE 0.9 % IV SOLN: 500.0000 mL | Freq: Once | INTRAVENOUS | Status: DC

## 2018-02-21 NOTE — Op Note (Signed)
[facility] Patient Name: Leslie Douglas Procedure Date: 02/21/2018 11:42 AM MRN: [id number] Endoscopist: Justice Britain , MD Age: 53 Referring MD:  Date of Birth: 11/13/1965 Gender: Female Account #: [id number] Procedure:                Colonoscopy Indications:              Screening for colorectal malignant neoplasm Medicines:                Monitored Anesthesia Care Procedure:                Pre-Anesthesia Assessment:                           - Prior to the procedure, a History and Physical                            was performed, and patient medications and                            allergies were reviewed. The patient's tolerance of                            previous anesthesia was also reviewed. The risks                            and benefits of the procedure and the sedation                            options and risks were discussed with the patient.                            All questions were answered, and informed consent                            was obtained. Prior Anticoagulants: The patient has                            taken no previous anticoagulant or antiplatelet                            agents. ASA Grade Assessment: II - A patient with                            mild systemic disease. After reviewing the risks                            and benefits, the patient was deemed in                            satisfactory condition to undergo the procedure.                           After obtaining informed consent, the colonoscope  was passed under direct vision. Throughout the                            procedure, the patient's blood pressure, pulse, and                            oxygen saturations were monitored continuously. The                            Colonoscope was introduced through the anus and                            advanced to the 5 cm into the ileum. The                            colonoscopy was  performed without difficulty. The                            patient tolerated the procedure. The quality of the                            bowel preparation was excellent. The terminal                            ileum, ileocecal valve, appendiceal orifice, and                            rectum were photographed. Scope In: 11:51:56 AM Scope Out: 12:09:11 PM Scope Withdrawal Time: 0 hours 13 minutes 33 seconds  Total Procedure Duration: 0 hours 17 minutes 15 seconds  Findings:                 The digital rectal exam findings include                            non-thrombosed external hemorrhoids. Pertinent                            negatives include no palpable rectal lesions.                           The terminal ileum and ileocecal valve appeared                            normal.                           A patchy areas of moderately erythematous mucosa                            were found in the entire colon. The left colon was                            biopsied with a cold forceps for histology. The  right colon was biopsied with a cold forceps for                            histology. The rectum was biopsied with a cold                            forceps for histology. This is for purposes of                            ruling out chronic colitis.                           Non-bleeding non-thrombosed external and internal                            hemorrhoids were found during retroflexion, during                            perianal exam and during digital exam. The                            hemorrhoids were Grade I (internal hemorrhoids that                            do not prolapse). Complications:            No immediate complications. Estimated Blood Loss:     Estimated blood loss was minimal. Impression:               - Non-thrombosed external hemorrhoids found on                            digital rectal exam.                           - The  examined portion of the ileum was normal.                           - Erythematous mucosa in the entire examined colon.                            Biopsied to rule out chronic colitis. Query if this                            is preparation artifact as patient was not having                            changes in bowel habits.                           - Non-bleeding non-thrombosed external and internal                            hemorrhoids. Recommendation:           - The patient will be observed post-procedure,  until all discharge criteria are met.                           - Discharge patient to home.                           - Patient has a contact number available for                            emergencies. The signs and symptoms of potential                            delayed complications were discussed with the                            patient. Return to normal activities tomorrow.                            Written discharge instructions were provided to the                            patient.                           - High fiber diet.                           - Use fiber, for example Citrucel, Fibercon, Konsyl                            or Metamucil.                           - Continue present medications.                           - Await pathology results.                           - Repeat colonoscopy in 10 years for surveillance                            based on pathology results if no evidence of                            chronic colitis.                           - The findings and recommendations were discussed                            with the patient.                           - The findings and recommendations were discussed  with the patient's family. Justice Britain, MD 02/21/2018 12:16:00 PM

## 2018-02-21 NOTE — Patient Instructions (Signed)
YOU HAD AN ENDOSCOPIC PROCEDURE TODAY AT [facility]:   Refer to the procedure report that was given to you for any specific questions about what was found during the examination.  If the procedure report does not answer your questions, please call your gastroenterologist to clarify.  If you requested that your care partner not be given the details of your procedure findings, then the procedure report has been included in a sealed envelope for you to review at your convenience later.  YOU SHOULD EXPECT: Some feelings of bloating in the abdomen. Passage of more gas than usual.  Walking can help get rid of the air that was put into your GI tract during the procedure and reduce the bloating. If you had a lower endoscopy (such as a colonoscopy or flexible sigmoidoscopy) you may notice spotting of blood in your stool or on the toilet paper. If you underwent a bowel prep for your procedure, you may not have a normal bowel movement for a few days.  Please Note:  You might notice some irritation and congestion in your nose or some drainage.  This is from the oxygen used during your procedure.  There is no need for concern and it should clear up in a day or so.  SYMPTOMS TO REPORT IMMEDIATELY:   Following lower endoscopy (colonoscopy or flexible sigmoidoscopy):  Excessive amounts of blood in the stool  Significant tenderness or worsening of abdominal pains  Swelling of the abdomen that is new, acute  Fever of 100F or higher  PLease see handout given to you on High Fiber diet. You can use Fibercon, Citrucel, Konsyl or Metamucil. For urgent or emergent issues, a gastroenterologist can be reached at any hour by calling [phone].   DIET:  We do recommend a small meal at first, but then you may proceed to your regular diet.  Drink plenty of fluids but you should avoid alcoholic beverages for 24 hours.  ACTIVITY:  You should plan to take it easy for the rest of today and you should NOT  DRIVE or use heavy machinery until tomorrow (because of the sedation medicines used during the test).    FOLLOW UP: Our staff will call the number listed on your records the next business day following your procedure to check on you and address any questions or concerns that you may have regarding the information given to you following your procedure. If we do not reach you, we will leave a message.  However, if you are feeling well and you are not experiencing any problems, there is no need to return our call.  We will assume that you have returned to your regular daily activities without incident.  If any biopsies were taken you will be contacted by phone or by letter within the next 1-3 weeks.  Please call us at [phone] if you have not heard about the biopsies in 3 weeks.    SIGNATURES/CONFIDENTIALITY: You and/or your care partner have signed paperwork which will be entered into your electronic medical record.  These signatures attest to the fact that that the information above on your After Visit Summary has been reviewed and is understood.  Full responsibility of the confidentiality of this discharge information lies with you and/or your care-partner.  Thank you for letting us take care of your healthcare needs today.

## 2018-02-21 NOTE — Progress Notes (Signed)
Called to room to assist during endoscopic procedure.  Patient ID and intended procedure confirmed with present staff. Received instructions for my participation in the procedure from the performing physician.  

## 2018-02-21 NOTE — Progress Notes (Signed)
Report given to PACU, vss 

## 2018-02-22 ENCOUNTER — Telehealth: Payer: Self-pay | Admitting: *Deleted

## 2018-02-22 NOTE — Telephone Encounter (Signed)
  Follow up Call-  Call back number 02/21/2018  Post procedure Call Back phone  # [phone]  Permission to leave phone message Yes  Some recent data might be hidden     Patient questions:  Do you have a fever, pain , or abdominal swelling? No. Pain Score  0 *  Have you tolerated food without any problems? Yes.    Have you been able to return to your normal activities? Yes.    Do you have any questions about your discharge instructions: Diet   No. Medications  No. Follow up visit  No.  Do you have questions or concerns about your Care? No.  Actions: * If pain score is 4 or above: No action needed, pain <4.

## 2018-02-25 ENCOUNTER — Encounter: Payer: Self-pay | Admitting: Gastroenterology

## 2018-03-01 ENCOUNTER — Ambulatory Visit: Payer: BLUE CROSS/BLUE SHIELD | Admitting: Physician Assistant

## 2018-03-01 ENCOUNTER — Encounter: Payer: Self-pay | Admitting: Physician Assistant

## 2018-03-01 VITALS — BP 138/88 | HR 76 | Temp 98.2°F | Ht 67.0 in | Wt 278.4 lb

## 2018-03-01 DIAGNOSIS — Z889 Allergy status to unspecified drugs, medicaments and biological substances status: Secondary | ICD-10-CM | POA: Diagnosis not present

## 2018-03-01 DIAGNOSIS — Z23 Encounter for immunization: Secondary | ICD-10-CM | POA: Diagnosis not present

## 2018-03-01 DIAGNOSIS — F5081 Binge eating disorder: Secondary | ICD-10-CM | POA: Diagnosis not present

## 2018-03-01 DIAGNOSIS — K529 Noninfective gastroenteritis and colitis, unspecified: Secondary | ICD-10-CM

## 2018-03-01 DIAGNOSIS — F418 Other specified anxiety disorders: Secondary | ICD-10-CM

## 2018-03-01 DIAGNOSIS — T782XXS Anaphylactic shock, unspecified, sequela: Secondary | ICD-10-CM | POA: Diagnosis not present

## 2018-03-01 DIAGNOSIS — F39 Unspecified mood [affective] disorder: Secondary | ICD-10-CM | POA: Diagnosis not present

## 2018-03-01 MED ORDER — HYDROXYZINE HCL 25 MG PO TABS: 25.0000 mg | ORAL_TABLET | Freq: Every evening | ORAL | 0 refills | Status: DC | PRN

## 2018-03-01 MED ORDER — EPINEPHRINE 0.3 MG/0.3ML IJ SOAJ: 0.3000 mg | INTRAMUSCULAR | 2 refills | Status: DC | PRN

## 2018-03-01 NOTE — Progress Notes (Signed)
Leslie Douglas is a 53 y.o. female here to Establish Care.  I acted as a Education administrator for [employer], PA-C Anselmo Pickler, LPN  History of Present Illness:   Chief Complaint  Patient presents with  . Establish Care   Chronic diarrhea --  Started in early 20's. No prior work-up. Had screening colonoscopy 02/21/2018. Biospsies revealed chronic inflammation, and concern for colitis. She has an appointment with GI to further discuss and work-up in March.  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 She has episodes where her palms itch, develops facial rash, lip swelling, diarrhea, hives, pre-syncope. Now has episodes that occurring several times a year. Wondering if she is having Mast Cell Activation. Eats all food groups. Once required hospitalization due to shock from taking OTC anti-diarrheal. When she was 16 she had some allergy testing and was "allergic to everything" and was recommended for immunotherapy but she never completed it. She has an epi-pen with her today, it expired in 2014.  Binge-eating disorder -- has significant personal and family history of this. Has never sought treatment for this. Denies any purging.  Anxiety and depression -- currently on Lexapro 20 mg. Was started on 10 mg daily in Nov 2019. No prior medication. Denies current SI/HI. Poor support system. Has concerns that she has bipolar disorder.  GAD 7 : Generalized Anxiety Score 03/01/2018  Nervous, Anxious, on Edge 3  Control/stop worrying 3  Worry too much - different things 3  Trouble relaxing 2  Restless 1  Easily annoyed or irritable 1  Afraid - awful might happen 2  Total GAD 7 Score 15  Anxiety Difficulty Very difficult   Depression screen [facility] 2/9 03/01/2018 11/24/2017 10/20/2017 04/15/2017  Decreased Interest 1 0 3 0  Down, Depressed, Hopeless 1 0 3 0  PHQ - 2 Score 2 0 6 0  Altered sleeping 1 - 1 -  Tired, decreased energy 1 - 3 -  Change in appetite 3 - 3 -  Feeling  bad or failure about yourself  3 - 3 -  Trouble concentrating 3 - 3 -  Moving slowly or fidgety/restless 0 - 0 -  Suicidal thoughts 1 - 1 -  PHQ-9 Score 14 - 20 -  Difficult doing work/chores Somewhat difficult - - -   Mood disorder questionnaire --> Yes to all questions except 10 and 11   Health Maintenance: Immunizations -- UTD, will give Tdap today. Colonoscopy -- UTD, repeat in 10 years Mammogram -- UTD PAP -- UTD, done 10/20/2017 NILM / HPV Neg Bone Density -- N/A Weight -- Weight: 278 lb 6.1 oz (126.3 kg)    Depression screen PHQ 2/9 03/01/2018  Decreased Interest 1  Down, Depressed, Hopeless 1  PHQ - 2 Score 2  Altered sleeping 1  Tired, decreased energy 1  Change in appetite 3  Feeling bad or failure about yourself  3  Trouble concentrating 3  Moving slowly or fidgety/restless 0  Suicidal thoughts 1  PHQ-9 Score 14  Difficult doing work/chores Somewhat difficult    GAD 7 : Generalized Anxiety Score 03/01/2018  Nervous, Anxious, on Edge 3  Control/stop worrying 3  Worry too much - different things 3  Trouble relaxing 2  Restless 1  Easily annoyed or irritable 1  Afraid - awful might happen 2  Total GAD 7 Score 15  Anxiety Difficulty Very difficult   Other providers/specialists: Patient Care Team: Inda Coke, [place] as PCP - General (Physician Assistant)  Past Medical History:  Diagnosis Date  . Allergy   . Anxiety   . Binge eating disorder   . Cancer ([place])    basal cell  . COPD (chronic obstructive pulmonary disease) ([facility])    chronic bronchitis  . Depression   . History of chicken pox      Social History   Socioeconomic History  . Marital status: Legally Separated    Spouse name: Not on file  . Number of children: 1  . Years of education: Not on file  . Highest education level: Not on file  Occupational History  . Not on file  Social Needs  . Financial resource strain: Not on file  . Food insecurity:    Worry: Not on file     Inability: Not on file  . Transportation needs:    Medical: Not on file    Non-medical: Not on file  Tobacco Use  . Smoking status: Never Smoker  . Smokeless tobacco: Never Used  Substance and Sexual Activity  . Alcohol use: Yes    Frequency: Never    Comment: rarely  . Drug use: Never  . Sexual activity: Not Currently    Birth control/protection: None  Lifestyle  . Physical activity:    Days per week: Not on file    Minutes per session: Not on file  . Stress: Not on file  Relationships  . Social connections:    Talks on phone: Not on file    Gets together: Not on file    Attends religious service: Not on file    Active member of club or organization: Not on file    Attends meetings of clubs or organizations: Not on file    Relationship status: Not on file  . Intimate partner violence:    Fear of current or ex partner: Not on file    Emotionally abused: Not on file    Physically abused: Not on file    Forced sexual activity: Not on file  Other Topics Concern  . Not on file  Social History Narrative   Divorced   Works for [employer], has Associate Degree   3 children    Past Surgical History:  Procedure Laterality Date  . CHOLECYSTECTOMY  2003  . MOHS SURGERY  10/2017  . VAGINAL DELIVERY  1995  . WISDOM TOOTH EXTRACTION  12/2017    Family History  Problem Relation Age of Onset  . Hypertension Mother   . Colon cancer Neg Hx   . Esophageal cancer Neg Hx   . Rectal cancer Neg Hx   . Stomach cancer Neg Hx     Allergies  Allergen Reactions  . Doxycycline Rash and Swelling    Mouth and face  . Erythromycin Shortness Of Breath  . Sulfa Antibiotics Shortness Of Breath  . Asa [Aspirin] Swelling  . Penicillins Rash     Current Medications:   Current Outpatient Medications:  .  cholecalciferol (VITAMIN D3) 25 MCG (1000 UT) tablet, Take 1,000 Units by mouth daily., Disp: , Rfl:  .  escitalopram (LEXAPRO) 20 MG tablet, Take 1 tablet (20 mg total) by  mouth daily., Disp: 90 tablet, Rfl: 3 .  Biotin w/ Vitamins C & E (HAIR/SKIN/NAILS PO), Take by mouth., Disp: , Rfl:  .  EPINEPHrine 0.3 mg/0.3 mL IJ SOAJ injection, Inject 0.3 mLs (0.3 mg total) into the muscle as needed for anaphylaxis., Disp: 2 Device, Rfl: 2 .  hydrOXYzine (ATARAX/VISTARIL) 25 MG tablet, Take 1 tablet (25  mg total) by mouth at bedtime as needed for anxiety (insomnia). Take one 25 mg tablet 30-60 minutes prior to bedtime for insomnia, anxiety. May increase to two tablets., Disp: 60 tablet, Rfl: 0 .  Multiple Vitamin (MULTIVITAMIN) tablet, Take 1 tablet by mouth daily., Disp: , Rfl:    Review of Systems:   Review of Systems  Constitutional: Negative for chills, fever, malaise/fatigue and weight loss.  HENT: Negative for congestion, ear pain and hearing loss.   Eyes: Negative for blurred vision, double vision and discharge.  Respiratory: Negative for shortness of breath.   Cardiovascular: Negative for chest pain, orthopnea, claudication and leg swelling.  Gastrointestinal: Negative for heartburn, nausea and vomiting.  Neurological: Negative for dizziness, tingling and headaches.  Endo/Heme/Allergies: Positive for environmental allergies.  Psychiatric/Behavioral: Positive for depression. The patient is nervous/anxious. The patient does not have insomnia.     Vitals:   Vitals:   03/01/18 0914  BP: 138/88  Pulse: 76  Temp: 98.2 F (36.8 C)  TempSrc: Oral  SpO2: 97%  Weight: 278 lb 6.1 oz (126.3 kg)  Height: 5' 7"  (1.702 m)      Body mass index is 43.6 kg/m.  Physical Exam:   Physical Exam Vitals signs and nursing note reviewed.  Constitutional:      General: She is not in acute distress.    Appearance: She is well-developed. She is not ill-appearing or toxic-appearing.  Cardiovascular:     Rate and Rhythm: Normal rate and regular rhythm.     Pulses: Normal pulses.     Heart sounds: Normal heart sounds, S1 normal and S2 normal.     Comments: No LE  edema Pulmonary:     Effort: Pulmonary effort is normal.     Breath sounds: Normal breath sounds.  Skin:    General: Skin is warm and dry.  Neurological:     Mental Status: She is alert.     GCS: GCS eye subscore is 4. GCS verbal subscore is 5. GCS motor subscore is 6.  Psychiatric:        Attention and Perception: Attention normal.        Mood and Affect: Mood is anxious and elated.        Speech: Speech is rapid and pressured.        Behavior: Behavior is hyperactive. Behavior is cooperative.        Thought Content: Thought content normal. Thought content does not include homicidal or suicidal plan.     Assessment and Plan:   Tabithia was seen today for establish care.  Diagnoses and all orders for this visit:  Multiple allergies; Anaphylaxis, sequela Will refer to allergy for further work-up. New prescription for epi-pen sent in. I have also given her an rx for Atarax. May use for allergies, anxiety, insomnia, as tolerated. ER precautions advised for any allergic reaction. -     Ambulatory referral to Allergy  Mood disorder ([place]); Binge eating disorder; Anxiety with depression Screening for mood disorder positive. Concern for untreated mania. Discussed crisis options -- including walk-in at [facility] or [place]. I have asked our in-office therapist to schedule appointment at her convenience with patient for therapy. I have also put in referral for psychiatry. I think medication change is likely warranted but I'm reluctant to change her current prescription, will defer to psychiatry. I discussed with patient that if they develop any SI, to tell someone immediately and seek medical attention. -     Ambulatory referral to Psychiatry  Chronic diarrhea Management per GI.  Need for prophylactic vaccination with combined diphtheria-tetanus-pertussis (DTP) vaccine -     Tdap vaccine greater than or equal to 7yo IM  Other orders -     EPINEPHrine 0.3 mg/0.3 mL IJ SOAJ  injection; Inject 0.3 mLs (0.3 mg total) into the muscle as needed for anaphylaxis. -     hydrOXYzine (ATARAX/VISTARIL) 25 MG tablet; Take 1 tablet (25 mg total) by mouth at bedtime as needed for anxiety (insomnia). Take one 25 mg tablet 30-60 minutes prior to bedtime for insomnia, anxiety. May increase to two tablets.  . Reviewed expectations re: course of current medical issues. . Discussed self-management of symptoms. . Outlined signs and symptoms indicating need for more acute intervention. . Patient verbalized understanding and all questions were answered. . See orders for this visit as documented in the electronic medical record. . Patient received an After-Visit Summary.  CMA or LPN served as scribe during this visit. History, Physical, and Plan performed by medical provider. The above documentation has been reviewed and is accurate and complete.  Over 45 minutes were spent face-to-face with the patient during this encounter and >50% of that time was spent on counseling and coordination of care  Inda Coke, PA-C

## 2018-03-01 NOTE — Patient Instructions (Signed)
It was great to see you!  I am going to reach out to our therapist, Trey Paula, to try to work you in on her schedule.  You will be contacted about your referral to psychiatry. If the wait is too long, please go to [facility] or [facility].  If you develop suicidal thoughts, please tell someone and go to the ER.  You will be contacted about your referral to Allergy as well.  Please pick up your new Epi-Pen. Look for coupon on GoodRx if needed.  May take Atarax as needed for anxiety/insomnia.  Let's follow-up in 1 month, sooner if you have concerns.  Take care,  Inda Coke PA-C

## 2018-03-13 ENCOUNTER — Ambulatory Visit (INDEPENDENT_AMBULATORY_CARE_PROVIDER_SITE_OTHER): Payer: BLUE CROSS/BLUE SHIELD | Admitting: Psychology

## 2018-03-13 DIAGNOSIS — F321 Major depressive disorder, single episode, moderate: Secondary | ICD-10-CM

## 2018-03-27 ENCOUNTER — Ambulatory Visit: Payer: BLUE CROSS/BLUE SHIELD | Admitting: Allergy & Immunology

## 2018-03-27 ENCOUNTER — Other Ambulatory Visit: Payer: Self-pay

## 2018-03-27 ENCOUNTER — Encounter: Payer: Self-pay | Admitting: Allergy & Immunology

## 2018-03-27 VITALS — BP 176/94 | HR 78 | Temp 98.2°F | Resp 16 | Ht 64.57 in | Wt 279.0 lb

## 2018-03-27 DIAGNOSIS — J302 Other seasonal allergic rhinitis: Secondary | ICD-10-CM | POA: Diagnosis not present

## 2018-03-27 DIAGNOSIS — J3089 Other allergic rhinitis: Secondary | ICD-10-CM | POA: Diagnosis not present

## 2018-03-27 DIAGNOSIS — T782XXD Anaphylactic shock, unspecified, subsequent encounter: Secondary | ICD-10-CM | POA: Diagnosis not present

## 2018-03-27 MED ORDER — FLUTICASONE PROPIONATE 93 MCG/ACT NA EXHU: 2.0000 | INHALANT_SUSPENSION | Freq: Two times a day (BID) | NASAL | 5 refills | Status: DC

## 2018-03-27 NOTE — Patient Instructions (Addendum)
1. Seasonal and perennial allergic rhinitis - Testing today showed: grasses, ragweed, trees, indoor molds, outdoor molds, dust mites, cat and dog - Copy of test results provided.  - Avoidance measures provided. - Start taking: Zyrtec (cetirizine) 68m tablet once daily and Xhance (fluticasone) 1-2 sprays per nostril twice daily - You can use an extra dose of the antihistamine, if needed, for breakthrough symptoms.  - Consider nasal saline rinses 1-2 times daily to remove allergens from the nasal cavities as well as help with mucous clearance (this is especially helpful to do before the nasal sprays are given) - Consider allergy shots as a means of long-term control. - Allergy shots "re-train" and "reset" the immune system to ignore environmental allergens and decrease the resulting immune response to those allergens (sneezing, itchy watery eyes, runny nose, nasal congestion, etc).    - Allergy shots improve symptoms in 75-85% of patients.  - We can discuss more at the next appointment if the medications are not working for you.  2. Anaphylactic reactions - Testing to the most common causes of food allergies was negative, which rules out 97-98% of all food allergies.  - We will get some labs to rule out serious causes of allergic reactions: tryptase level, chronic urticaria panel, CMP, ESR, alpha gal panel, and CRP. - We are also going to get some urine studies to rule out carcinoid syndrome and pheochromocytoma, which I think are very unlikely, but it would be best to just rule this out.   3. Return in about 6 weeks (around 05/08/2018).   Please inform [facility] any Emergency Department visits, hospitalizations, or changes in symptoms. Call [facility] going to the ED for breathing or allergy symptoms since we might be able to fit you in for a sick visit. Feel free to contact [facility] with any questions, problems, or concerns.  It was a pleasure to meet you today!  Websites that have reliable  patient information: 1. American Academy of Asthma, Allergy, and Immunology: www.aaaai.org 2. Food Allergy Research and Education (FARE): foodallergy.org 3. Mothers of Asthmatics: http://www.asthmacommunitynetwork.org 4. American College of Allergy, Asthma, and Immunology: wMonthlyElectricBill.co.uk  Make sure you are registered to vote! If you have moved or changed any of your contact information, you will need to get this updated before voting!    Voter ID laws are NOT going into effect for the General Election in November 2020! DO NOT let this stop you from exercising your right to vote!     Reducing Pollen Exposure  The American Academy of Allergy, Asthma and Immunology suggests the following steps to reduce your exposure to pollen during allergy seasons.    1. Do not hang sheets or clothing out to dry; pollen may collect on these items. 2. Do not mow lawns or spend time around freshly cut grass; mowing stirs up pollen. 3. Keep windows closed at night.  Keep car windows closed while driving. 4. Minimize morning activities outdoors, a time when pollen counts are usually at their highest. 5. Stay indoors as much as possible when pollen counts or humidity is high and on windy days when pollen tends to remain in the air longer. 6. Use air conditioning when possible.  Many air conditioners have filters that trap the pollen spores. 7. Use a HEPA room air filter to remove pollen form the indoor air you breathe.  Control of House Dust Mite Allergen    House dust mites play a major role in allergic asthma and rhinitis.  They occur in environments with high humidity wherever human skin, the food for dust mites is found. High levels have been detected in dust obtained from mattresses, pillows, carpets, upholstered furniture, bed covers, clothes and soft toys.  The principal allergen of the house dust mite is found in its feces.  A gram of dust may contain 1,000 mites and 250,000 fecal particles.  Mite  antigen is easily measured in the air during house cleaning activities.    1. Encase mattresses, including the box spring, and pillow, in an air tight cover.  Seal the zipper end of the encased mattresses with wide adhesive tape. 2. Wash the bedding in water of 130 degrees Farenheit weekly.  Avoid cotton comforters/quilts and flannel bedding: the most ideal bed covering is the dacron comforter. 3. Remove all upholstered furniture from the bedroom. 4. Remove carpets, carpet padding, rugs, and non-washable window drapes from the bedroom.  Wash drapes weekly or use plastic window coverings. 5. Remove all non-washable stuffed toys from the bedroom.  Wash stuffed toys weekly. 6. Have the room cleaned frequently with a vacuum cleaner and a damp dust-mop.  The patient should not be in a room which is being cleaned and should wait 1 hour after cleaning before going into the room. 7. Close and seal all heating outlets in the bedroom.  Otherwise, the room will become filled with dust-laden air.  An electric heater can be used to heat the room. 8. Reduce indoor humidity to less than 50%.  Do not use a humidifier.  Control of Dog or Cat Allergen  Avoidance is the best way to manage a dog or cat allergy. If you have a dog or cat and are allergic to dog or cats, consider removing the dog or cat from the home. If you have a dog or cat but don't want to find it a new home, or if your family wants a pet even though someone in the household is allergic, here are some strategies that may help keep symptoms at bay:  1. Keep the pet out of your bedroom and restrict it to only a few rooms. Be advised that keeping the dog or cat in only one room will not limit the allergens to that room. 2. Don't pet, hug or kiss the dog or cat; if you do, wash your hands with soap and water. 3. High-efficiency particulate air (HEPA) cleaners run continuously in a bedroom or living room can reduce allergen levels over time. 4. Regular  use of a high-efficiency vacuum cleaner or a central vacuum can reduce allergen levels. 5. Giving your dog or cat a bath at least once a week can reduce airborne allergen.  Control of Mold Allergen   Mold and fungi can grow on a variety of surfaces provided certain temperature and moisture conditions exist.  Outdoor molds grow on plants, decaying vegetation and soil.  The major outdoor mold, Alternaria and Cladosporium, are found in very high numbers during hot and dry conditions.  Generally, a late Summer - Fall peak is seen for common outdoor fungal spores.  Rain will temporarily lower outdoor mold spore count, but counts rise rapidly when the rainy period ends.  The most important indoor molds are Aspergillus and Penicillium.  Dark, humid and poorly ventilated basements are ideal sites for mold growth.  The next most common sites of mold growth are the bathroom and the kitchen.  Outdoor (Seasonal) Mold Control  Positive outdoor molds via skin testing: Alternaria and Cladosporium  1.  Use air conditioning and keep windows closed 2. Avoid exposure to decaying vegetation. 3. Avoid leaf raking. 4. Avoid grain handling. 5. Consider wearing a face mask if working in moldy areas.  6.   Indoor (Perennial) Mold Control   Positive indoor molds via skin testing: Aspergillus, Penicillium, Fusarium, Aureobasidium (Pullulara) and Rhizopus  1. Maintain humidity below 50%. 2. Clean washable surfaces with 5% bleach solution. 3. Remove sources e.g. contaminated carpets.      Allergy Shots   Allergies are the result of a chain reaction that starts in the immune system. Your immune system controls how your body defends itself. For instance, if you have an allergy to pollen, your immune system identifies pollen as an invader or allergen. Your immune system overreacts by producing antibodies called Immunoglobulin E (IgE). These antibodies travel to cells that release chemicals, causing an allergic  reaction.  The concept behind allergy immunotherapy, whether it is received in the form of shots or tablets, is that the immune system can be desensitized to specific allergens that trigger allergy symptoms. Although it requires time and patience, the payback can be long-term relief.  How Do Allergy Shots Work?  Allergy shots work much like a vaccine. Your body responds to injected amounts of a particular allergen given in increasing doses, eventually developing a resistance and tolerance to it. Allergy shots can lead to decreased, minimal or no allergy symptoms.  There generally are two phases: build-up and maintenance. Build-up often ranges from three to six months and involves receiving injections with increasing amounts of the allergens. The shots are typically given once or twice a week, though more rapid build-up schedules are sometimes used.  The maintenance phase begins when the most effective dose is reached. This dose is different for each person, depending on how allergic you are and your response to the build-up injections. Once the maintenance dose is reached, there are longer periods between injections, typically two to four weeks.  Occasionally doctors give cortisone-type shots that can temporarily reduce allergy symptoms. These types of shots are different and should not be confused with allergy immunotherapy shots.  Who Can Be Treated with Allergy Shots?  Allergy shots may be a good treatment approach for people with allergic rhinitis (hay fever), allergic asthma, conjunctivitis (eye allergy) or stinging insect allergy.   Before deciding to begin allergy shots, you should consider:  . The length of allergy season and the severity of your symptoms . Whether medications and/or changes to your environment can control your symptoms . Your desire to avoid long-term medication use . Time: allergy immunotherapy requires a major time commitment . Cost: may vary depending on your  insurance coverage  Allergy shots for children age 17 and older are effective and often well tolerated. They might prevent the onset of new allergen sensitivities or the progression to asthma.  Allergy shots are not started on patients who are pregnant but can be continued on patients who become pregnant while receiving them. In some patients with other medical conditions or who take certain common medications, allergy shots may be of risk. It is important to mention other medications you talk to your allergist.   When Will I Feel Better?  Some may experience decreased allergy symptoms during the build-up phase. For others, it may take as long as 12 months on the maintenance dose. If there is no improvement after a year of maintenance, your allergist will discuss other treatment options with you.  If you aren't responding to allergy shots,  it may be because there is not enough dose of the allergen in your vaccine or there are missing allergens that were not identified during your allergy testing. Other reasons could be that there are high levels of the allergen in your environment or major exposure to non-allergic triggers like tobacco smoke.  What Is the Length of Treatment?  Once the maintenance dose is reached, allergy shots are generally continued for three to five years. The decision to stop should be discussed with your allergist at that time. Some people may experience a permanent reduction of allergy symptoms. Others may relapse and a longer course of allergy shots can be considered.  What Are the Possible Reactions?  The two types of adverse reactions that can occur with allergy shots are local and systemic. Common local reactions include very mild redness and swelling at the injection site, which can happen immediately or several hours after. A systemic reaction, which is less common, affects the entire body or a particular body system. They are usually mild and typically respond  quickly to medications. Signs include increased allergy symptoms such as sneezing, a stuffy nose or hives.  Rarely, a serious systemic reaction called anaphylaxis can develop. Symptoms include swelling in the throat, wheezing, a feeling of tightness in the chest, nausea or dizziness. Most serious systemic reactions develop within 30 minutes of allergy shots. This is why it is strongly recommended you wait in your doctor's office for 30 minutes after your injections. Your allergist is trained to watch for reactions, and his or her staff is trained and equipped with the proper medications to identify and treat them.  Who Should Administer Allergy Shots?  The preferred location for receiving shots is your prescribing allergist's office. Injections can sometimes be given at another facility where the physician and staff are trained to recognize and treat reactions, and have received instructions by your prescribing allergist.

## 2018-03-27 NOTE — Progress Notes (Signed)
NEW PATIENT  Date of Service/Encounter:  03/27/18  Referring provider: Inda Coke, PA   Assessment:   Anaphylaxis - unknown trigger  Seasonal and perennial allergic rhinitis (grasses, ragweed, trees, indoor molds, outdoor molds, dust mites, cat and dog)   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  It is still rather clear what is causing these reactions.  We are going to get some lab work to rule out carcinoid syndrome and pheochromocytoma, although I think these are fairly unlikely.  We are going to get some lab work to rule out an alpha gal allergy which if positive would definitely predate the original description of the alpha gal syndrome since this has been occurring for over 30 years.  We are going to get some lab work to look for autoimmune causes as well.  We did reinforce the need to use epinephrine and made sure that she knew how to use it.  She did have a multitude of sensitizations on environmental allergy testing, and allergy shots would probably help calm down her atopic response.  However, this is not quick acting it would require some dedication on her part.  Plan/Recommendations:   1. Seasonal and perennial allergic rhinitis - Testing today showed: grasses, ragweed, trees, indoor molds, outdoor molds, dust mites, cat and dog - Copy of test results provided.  - Avoidance measures provided. - Start taking: Zyrtec (cetirizine) 33m tablet once daily and Xhance (fluticasone) 1-2 sprays per nostril twice daily - You can use an extra dose of the antihistamine, if needed, for breakthrough symptoms.  - Consider nasal saline rinses 1-2 times daily to remove allergens from the nasal cavities as well as help with mucous clearance (this is especially helpful to do before the nasal sprays are given) - Consider allergy shots as a means of long-term control. - Allergy shots "re-train" and "reset" the immune system to ignore environmental allergens  and decrease the resulting immune response to those allergens (sneezing, itchy watery eyes, runny nose, nasal congestion, etc).    - Allergy shots improve symptoms in 75-85% of patients.  - We can discuss more at the next appointment if the medications are not working for you.  2. Anaphylactic reactions - Testing to the most common causes of food allergies was negative, which rules out 97-98% of all food allergies.  - We will get some labs to rule out serious causes of allergic reactions: tryptase level, chronic urticaria panel, CMP, ESR, alpha gal panel, and CRP. - We are also going to get some urine studies to rule out carcinoid syndrome and pheochromocytoma, which I think are very unlikely, but it would be best to just rule this out.   3. Return in about 6 weeks (around 05/08/2018).  Subjective:   Leslie KETOILE LOOMANis a 53y.o. female presenting today for evaluation of  Chief Complaint  Patient presents with  . Allergy Testing  . Sinus Problem    chronic sinus infections, post nasal drip  . Wheezing  . Shortness of Breath    with activity since october  . food allergy    had testing before, but still eats those things  . anaphylaxis    hands, palms, bottom of feet gets itchy and red, facial swelling    Leslie Douglas a history of the following: Patient Active Problem List   Diagnosis Date Noted  . Basal cell carcinoma 10/20/2017  . Anxiety with depression 10/20/2017  . Binge eating disorder 10/20/2017  History obtained from: chart review and patient.  Leslie Douglas was referred by Inda Coke, PA.     Leslie Douglas is a 53 y.o. female presenting for an evaluation of a multitude of complaints, but centered around episodes of anaphylaxis.  She reports that since the age of 35, she has had episodes of anaphylaxis.  These episodes start with itchy palms and feet.  Then her face starts to swell including her eyes and lips.  She also develops diarrhea and early on she  would develop vomiting as well.  She finally has hives and abdominal pain before passing out.  The entirety of the symptoms take around 45 minutes to an hour to resolve.  These occur 2-3 times a year, although she notes during certain times in her life with increased stress they seem to occur more often.  They can happen anywhere and everywhere, including work, home, or when she is out in public.  Despite the intensity of these episodes, it seems that she has only been to the ER once in 1993.  At that time she was admitted to the ICU due to dehydration.  She was discharged in less than 36 hours.  She has had an EpiPen intermittently over the years and is only needed to use it twice. Of note, during the episodes, she does endorse flushing as well as tachycardia.  She denies any food triggers.  These are never associated with the ingestion of any mammalian meats.  She tolerates all of the food allergens without adverse events.  She does avoid shellfish, but is not entirely clear why.  Interestingly, around the age of 30 before these episodes started, she had RAST testing that was positive to "the whole panel".  It was even positive to a multitude of foods.  She was told to avoid these foods but she never did.  This work-up was done because she was experiencing heart flutters.  She was worked up by her cardiologist and the work-up turned out to be normal.  It seems that the allergy testing was something done at the end of this work-up and her symptoms were attributed to this.  Allergy shots were recommended but she never went through with this.  Of note, she also has a history of anxiety and depression.  She just recently had this addressed.  She is on Lexapro at this point, and her dosage is being titrated.  She also has a history of chronic diarrhea.  She underwent a colonoscopy in February 2020 that showed erythematous mucosa throughout the entire colon.  She is followed by Dr. Justice Britain.  Colonoscopy  Results (February 2020)   Colon Pathology Results (February 2020)    Otherwise, there is no history of other atopic diseases, including drug allergies, stinging insect allergies, eczema, urticaria or contact dermatitis. There is no significant infectious history. Vaccinations are up to date.    Past Medical History: Patient Active Problem List   Diagnosis Date Noted  . Basal cell carcinoma 10/20/2017  . Anxiety with depression 10/20/2017  . Binge eating disorder 10/20/2017    Medication List:  Allergies as of 03/27/2018      Reactions   Doxycycline Rash, Swelling   Mouth and face   Erythromycin Shortness Of Breath   Sulfa Antibiotics Shortness Of Breath   Asa [aspirin]    Penicillins Rash      Medication List       Accurate as of March 27, 2018 11:53 AM. Always use your most recent  med list.        cholecalciferol 25 MCG (1000 UT) tablet Commonly known as:  VITAMIN D3 Take 1,000 Units by mouth daily.   EPINEPHrine 0.3 mg/0.3 mL Soaj injection Commonly known as:  EPI-PEN Inject 0.3 mLs (0.3 mg total) into the muscle as needed for anaphylaxis.   escitalopram 20 MG tablet Commonly known as:  LEXAPRO Take 1 tablet (20 mg total) by mouth daily.   Fluticasone Propionate 93 MCG/ACT Exhu Commonly known as:  Xhance Place 2 sprays into the nose 2 (two) times daily for 30 days.   HAIR/SKIN/NAILS PO Take by mouth.   multivitamin tablet Take 1 tablet by mouth daily.       Birth History: non-contributory  Developmental History: non-contributory.   Past Surgical History: Past Surgical History:  Procedure Laterality Date  . CHOLECYSTECTOMY  2003  . MOHS SURGERY  10/2017  . VAGINAL DELIVERY  1995  . WISDOM TOOTH EXTRACTION  12/2017     Family History: Family History  Problem Relation Age of Onset  . Hypertension Mother   . Colon cancer Neg Hx   . Esophageal cancer Neg Hx   . Rectal cancer Neg Hx   . Stomach cancer Neg Hx      Social History: [place]  lives at home with her two cats. She currently lives in a new apartment with carpeting throughout the home. They have electric heating and central cooling. There are no dust mite coverings in the bedding. There is no tobacco exposure in the home. She currently works in Aeronautical engineer at [employer]. She is not a smoker and has two cats inside of the apartment. She did have dogs at one point as well.    Review of Systems  Constitutional: Negative.  Negative for fever, malaise/fatigue and weight loss.  HENT: Negative.  Negative for congestion, ear discharge and ear pain.   Eyes: Negative for pain, discharge and redness.  Respiratory: Negative for cough, sputum production, shortness of breath and wheezing.   Cardiovascular: Negative.  Negative for chest pain and palpitations.  Gastrointestinal: Positive for abdominal pain, diarrhea, nausea and vomiting. Negative for heartburn.  Skin: Positive for itching and rash.  Neurological: Negative for dizziness and headaches.  Endo/Heme/Allergies: Positive for environmental allergies. Does not bruise/bleed easily.       Objective:   Blood pressure (!) 176/94, pulse 78, temperature 98.2 F (36.8 C), temperature source Oral, resp. rate 16, height 5' 4.57" (1.64 m), weight 279 lb (126.6 kg), SpO2 97 %. Body mass index is 47.05 kg/m.   Physical Exam:   Physical Exam  Constitutional: She appears well-developed and well-nourished.  Pleasant talkative female.  HENT:  Head: Normocephalic and atraumatic.  Right Ear: Tympanic membrane, external ear and ear canal normal. No drainage, swelling or tenderness. Tympanic membrane is not injected, not scarred, not erythematous, not retracted and not bulging.  Left Ear: Tympanic membrane, external ear and ear canal normal. No drainage, swelling or tenderness. Tympanic membrane is not injected, not scarred, not erythematous, not retracted and not bulging.  Nose: Mucosal edema and rhinorrhea  present. No nasal deformity or septal deviation. No epistaxis. Right sinus exhibits no maxillary sinus tenderness and no frontal sinus tenderness. Left sinus exhibits no maxillary sinus tenderness and no frontal sinus tenderness.  Mouth/Throat: Uvula is midline and oropharynx is clear and moist. Mucous membranes are not pale and not dry.  Turbinates enlarged bilaterally.  There is clear rhinorrhea bilaterally.  Cobblestoning present in the posterior oropharynx.  Eyes: Pupils are equal, round, and reactive to light. Conjunctivae and EOM are normal. Right eye exhibits no chemosis and no discharge. Left eye exhibits no chemosis and no discharge. Right conjunctiva is not injected. Left conjunctiva is not injected.  Cardiovascular: Normal rate, regular rhythm and normal heart sounds.  Respiratory: Effort normal and breath sounds normal. No accessory muscle usage. No tachypnea. No respiratory distress. She has no wheezes. She has no rhonchi. She has no rales. She exhibits no tenderness.  Moving air well in all lung fields.  No increased work of breathing.  GI: There is no abdominal tenderness. There is no rebound and no guarding.  Lymphadenopathy:       Head (right side): No submandibular, no tonsillar and no occipital adenopathy present.       Head (left side): No submandibular, no tonsillar and no occipital adenopathy present.    She has no cervical adenopathy.  Neurological: She is alert.  Skin: No abrasion, no petechiae and no rash noted. Rash is not papular, not vesicular and not urticarial. No erythema. No pallor.  No mottling or urticaria present.  No dermatographism.  Psychiatric: She has a normal mood and affect.     Diagnostic studies:     Allergy Studies:    Airborne Adult Perc - 03/27/18 1001    Time Antigen Placed  1000    Allergen Manufacturer  Lavella Hammock    Location  Back    Number of Test  59    Panel 1  Select    1. Control-Buffer 50% Glycerol  Negative    2. Control-Histamine 1  mg/ml  3+    3. Albumin saline  Negative    4. Gans  2+    5. [place]  2+    6. Johnson  Negative    7. Kentucky Blue  2+    8. Meadow Fescue  2+    9. Perennial Rye  2+    10. Sweet Vernal  2+    11. Timothy  2+    12. Cocklebur  Negative    13. Burweed Marshelder  Negative    14. Ragweed, short  2+    15. Ragweed, Giant  Negative    16. Plantain,  English  Negative    17. Lamb's Quarters  Negative    18. Sheep Sorrell  Negative    19. Rough Pigweed  Negative    20. Marsh Elder, Rough  Negative    21. Mugwort, Common  Negative    22. Ash mix  Negative    23. Birch mix  4+    24. Beech American  2+    25. Box, Elder  Negative    26. Cedar, red  Negative    27. Cottonwood, [place]  Negative    28. Elm mix  Negative    29. Hickory mix  Negative    30. Maple mix  Negative    31. Oak, [place] mix  2+    32. Pecan Pollen  Negative    33. Pine mix  Negative    34. Sycamore Eastern  Negative    35. Guyton, Black Pollen  2+    36. Alternaria alternata  Negative    37. Cladosporium Herbarum  Negative    38. Aspergillus mix  Negative    39. Penicillium mix  Negative    40. Bipolaris sorokiniana (Helminthosporium)  Negative    41. Drechslera spicifera (Curvularia)  Negative    42. Mucor plumbeus  Negative  43. Fusarium moniliforme  Negative    44. Aureobasidium pullulans (pullulara)  Negative    45. Rhizopus oryzae  Negative    46. Botrytis cinera  Negative    47. Epicoccum nigrum  Negative    48. Phoma betae  Negative    49. Candida Albicans  Negative    50. Trichophyton mentagrophytes  Negative    51. Mite, D Farinae  5,000 AU/ml  Negative    52. Mite, D Pteronyssinus  5,000 AU/ml  3+    53. Cat Hair 10,000 BAU/ml  2+    54.  Dog Epithelia  2+    55. Mixed Feathers  Negative    56. Horse Epithelia  Negative    57. Cockroach, German  Negative    58. Mouse  Negative    59. Tobacco Leaf  Negative     Food Perc - 03/27/18 1002    Time Antigen Placed  1000     Allergen Manufacturer  [place]er    Location  Back    Number of allergen test  10    Food  Select    1. Peanut  Negative    2. Soybean food  Negative    3. Wheat, whole  Negative    4. Sesame  Negative    5. Milk, cow  Negative    6. Egg White, chicken  Negative    7. Casein  Negative    8. Shellfish mix  Negative    9. Fish mix  Negative    10. Cashew  Negative     Intradermal - 03/27/18 1026    Time Antigen Placed  1026    Allergen Manufacturer  Lavella Hammock    Location  Arm    Number of Test  7    Intradermal  Select    Control  Negative    Weed mix  Negative    Mold 1  2+    Mold 2  3+    Mold 3  Negative    Mold 4  3+    Cockroach  Negative       Allergy testing results were read and interpreted by myself, documented by clinical staff.         Salvatore Marvel, MD [facility]

## 2018-03-30 ENCOUNTER — Ambulatory Visit: Payer: BLUE CROSS/BLUE SHIELD | Admitting: Physician Assistant

## 2018-04-01 LAB — CMP14+EGFR
ALT: 39 IU/L — ABNORMAL HIGH (ref 0–32)
AST: 26 IU/L (ref 0–40)
Albumin/Globulin Ratio: 1.7 (ref 1.2–2.2)
Albumin: 4.5 g/dL (ref 3.8–4.9)
Alkaline Phosphatase: 66 IU/L (ref 39–117)
BUN/Creatinine Ratio: 13 (ref 9–23)
BUN: 9 mg/dL (ref 6–24)
Bilirubin Total: 0.3 mg/dL (ref 0.0–1.2)
CO2: 23 mmol/L (ref 20–29)
Calcium: 9.6 mg/dL (ref 8.7–10.2)
Chloride: 102 mmol/L (ref 96–106)
Creatinine, Ser: 0.72 mg/dL (ref 0.57–1.00)
GFR calc Af Amer: 110 mL/min/{1.73_m2} (ref >59)
GFR calc non Af Amer: 95 mL/min/{1.73_m2} (ref >59)
Globulin, Total: 2.6 g/dL (ref 1.5–4.5)
Glucose: 83 mg/dL (ref 65–99)
Potassium: 4.3 mmol/L (ref 3.5–5.2)
Sodium: 141 mmol/L (ref 134–144)
Total Protein: 7.1 g/dL (ref 6.0–8.5)

## 2018-04-01 LAB — SEDIMENTATION RATE: Sed Rate: 7 mm/hr (ref 0–40)

## 2018-04-01 LAB — TRYPTASE: Tryptase: 6.7 ug/L (ref 2.2–13.2)

## 2018-04-01 LAB — ALPHA-GAL PANEL
Alpha Gal IgE*: <0.1 kU/L (ref <0.10)
Beef (Bos spp) IgE: <0.1 kU/L (ref <0.35)
Class Interpretation: 0
Class Interpretation: 0
Class Interpretation: 0
Lamb/Mutton (Ovis spp) IgE: <0.1 kU/L (ref <0.35)
Pork (Sus spp) IgE: <0.1 kU/L (ref <0.35)

## 2018-04-01 LAB — ANA W/REFLEX IF POSITIVE: Anti Nuclear Antibody (ANA): NEGATIVE

## 2018-04-01 LAB — CHRONIC URTICARIA: cu index: 3.3 (ref <10)

## 2018-04-01 LAB — SEROTONIN SERUM: Serotonin, Serum: 11 ng/mL (ref 0–420)

## 2018-04-01 LAB — C-REACTIVE PROTEIN: CRP: 1 mg/L (ref 0–10)

## 2018-04-04 ENCOUNTER — Telehealth (INDEPENDENT_AMBULATORY_CARE_PROVIDER_SITE_OTHER): Payer: BLUE CROSS/BLUE SHIELD | Admitting: Gastroenterology

## 2018-04-04 ENCOUNTER — Other Ambulatory Visit: Payer: Self-pay

## 2018-04-04 ENCOUNTER — Encounter: Payer: Self-pay | Admitting: Gastroenterology

## 2018-04-04 DIAGNOSIS — K529 Noninfective gastroenteritis and colitis, unspecified: Secondary | ICD-10-CM | POA: Diagnosis not present

## 2018-04-04 DIAGNOSIS — R933 Abnormal findings on diagnostic imaging of other parts of digestive tract: Secondary | ICD-10-CM

## 2018-04-04 DIAGNOSIS — K5100 Ulcerative (chronic) pancolitis without complications: Secondary | ICD-10-CM | POA: Diagnosis not present

## 2018-04-04 DIAGNOSIS — R1012 Left upper quadrant pain: Secondary | ICD-10-CM

## 2018-04-04 DIAGNOSIS — Z9049 Acquired absence of other specified parts of digestive tract: Secondary | ICD-10-CM

## 2018-04-04 NOTE — Patient Instructions (Signed)
Your provider has requested that you go to the basement level for lab work in the next 1-2 weeks. Press "B" on the elevator. The lab is located at the first door on the left as you exit the elevator. Lab hours are 7:30am- 5:00pm Monday-Friday. No appointment needed.     As per your conversation with Dr.Mansouraty, he would like for you to let us know how your Abdominal pain(LUQ) is doing in the next week or so, if no improvement in LUQ pain, then consider Abdominal Ultrasound.     Thank you for choosing me and [facility].  Dr. Rush Landmark
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
## 2018-04-05 LAB — 5 HIAA, QUANTITATIVE, URINE, 24 HOUR
5-HIAA, Ur: 2.9 mg/L
5-HIAA,Quant.,24 Hr Urine: 1.5 mg/24 hr (ref 0.0–14.9)

## 2018-04-05 LAB — CATECHOLAMINE+VMA, 24-HR URINE
Dopamine , 24H Ur: 752 ug/24 hr — ABNORMAL HIGH (ref 0–510)
Dopamine, Rand Ur: 1503 ug/L
Epinephrine, 24H Ur: 5 ug/24 hr (ref 0–20)
Epinephrine, Rand Ur: 10 ug/L
Norepinephrine, 24H Ur: 102 ug/24 hr (ref 0–135)
Norepinephrine, Rand Ur: 203 ug/L
VMA, 24H Ur Adult: 3.8 mg/24 hr (ref 0.0–7.5)
VMA, Urine: 7.6 mg/L

## 2018-04-05 LAB — METANEPHRINES, URINE, 24 HOUR
Metaneph Total, Ur: 133 ug/L
Metanephrines, 24H Ur: 67 ug/24 hr (ref 45–290)
Normetanephrine, 24H Ur: 265 ug/24 hr (ref 82–500)
Normetanephrine, Ur: 529 ug/L

## 2018-04-06 ENCOUNTER — Telehealth: Payer: Self-pay

## 2018-04-06 MED ORDER — TRIAMCINOLONE ACETONIDE 0.5 % EX OINT: TOPICAL_OINTMENT | CUTANEOUS | 1 refills | Status: DC

## 2018-04-06 NOTE — Telephone Encounter (Signed)
Spoke to patient advised triamcinolone ointment was sent in and want to do. Patient verbalized understanding

## 2018-04-06 NOTE — Telephone Encounter (Signed)
Patient had allergy testing and intradermal testing.   3 are still red and raised

## 2018-04-06 NOTE — Telephone Encounter (Signed)
That is a long time for the redness from the intradermals to still be there. They can take a couple of weeks to fully resolve. Please send in triamcinolone ointment 0.5% ointment to use twice daily for a week and then have her call me for an update.  Salvatore Marvel, MD [facility] [place] [place]

## 2018-04-06 NOTE — Addendum Note (Signed)
Addended by: Orlene Erm on: 04/06/2018 04:51 PM   Modules accepted: Orders

## 2018-04-06 NOTE — Telephone Encounter (Signed)
Dr. Ernst Bowler, can you advise on how long it takes for the redness and "swelling" to subside?    Thanks!

## 2018-04-07 ENCOUNTER — Telehealth: Payer: Self-pay | Admitting: *Deleted

## 2018-04-07 ENCOUNTER — Other Ambulatory Visit (INDEPENDENT_AMBULATORY_CARE_PROVIDER_SITE_OTHER): Payer: BLUE CROSS/BLUE SHIELD

## 2018-04-07 DIAGNOSIS — K529 Noninfective gastroenteritis and colitis, unspecified: Secondary | ICD-10-CM

## 2018-04-07 DIAGNOSIS — K5100 Ulcerative (chronic) pancolitis without complications: Secondary | ICD-10-CM | POA: Diagnosis not present

## 2018-04-07 DIAGNOSIS — R1012 Left upper quadrant pain: Secondary | ICD-10-CM

## 2018-04-07 DIAGNOSIS — R933 Abnormal findings on diagnostic imaging of other parts of digestive tract: Secondary | ICD-10-CM

## 2018-04-07 DIAGNOSIS — Z9049 Acquired absence of other specified parts of digestive tract: Secondary | ICD-10-CM | POA: Diagnosis not present

## 2018-04-07 LAB — IGA: IgA: 151 mg/dL (ref 68–378)

## 2018-04-07 LAB — HIGH SENSITIVITY CRP: CRP, High Sensitivity: 1.83 mg/L (ref 0.000–5.000)

## 2018-04-07 LAB — SEDIMENTATION RATE: Sed Rate: 18 mm/hr (ref 0–30)

## 2018-04-07 LAB — HEPATIC FUNCTION PANEL
ALT: 38 U/L — ABNORMAL HIGH (ref 0–35)
AST: 24 U/L (ref 0–37)
Albumin: 4.3 g/dL (ref 3.5–5.2)
Alkaline Phosphatase: 62 U/L (ref 39–117)
Bilirubin, Direct: 0.1 mg/dL (ref 0.0–0.3)
Total Bilirubin: 0.4 mg/dL (ref 0.2–1.2)
Total Protein: 7.4 g/dL (ref 6.0–8.3)

## 2018-04-07 LAB — CBC
HCT: 39.4 % (ref 36.0–46.0)
Hemoglobin: 13.5 g/dL (ref 12.0–15.0)
MCHC: 34.2 g/dL (ref 30.0–36.0)
MCV: 88.2 fl (ref 78.0–100.0)
Platelets: 317 10*3/uL (ref 150.0–400.0)
RBC: 4.47 Mil/uL (ref 3.87–5.11)
RDW: 13 % (ref 11.5–15.5)
WBC: 6.3 10*3/uL (ref 4.0–10.5)

## 2018-04-07 LAB — BASIC METABOLIC PANEL
BUN: 16 mg/dL (ref 6–23)
CO2: 26 mEq/L (ref 19–32)
Calcium: 9.4 mg/dL (ref 8.4–10.5)
Chloride: 105 mEq/L (ref 96–112)
Creatinine, Ser: 0.77 mg/dL (ref 0.40–1.20)
GFR: 77.87 mL/min (ref >60.00)
Glucose, Bld: 87 mg/dL (ref 70–99)
Potassium: 3.9 mEq/L (ref 3.5–5.1)
Sodium: 139 mEq/L (ref 135–145)

## 2018-04-07 LAB — PROTIME-INR
INR: 1 ratio (ref 0.8–1.0)
Prothrombin Time: 11.6 s (ref 9.6–13.1)

## 2018-04-07 LAB — LIPASE: Lipase: 16 U/L (ref 11.0–59.0)

## 2018-04-07 LAB — AMYLASE: Amylase: 29 U/L (ref 27–131)

## 2018-04-07 LAB — TSH: TSH: 1.41 u[IU]/mL (ref 0.35–4.50)

## 2018-04-07 NOTE — Addendum Note (Signed)
Addended by: Isaiah Serge D on: 04/07/2018 01:01 PM   Modules accepted: Orders

## 2018-04-07 NOTE — Telephone Encounter (Signed)
Pt agreed to [employer], verified e-mail. Please send invite.

## 2018-04-10 ENCOUNTER — Other Ambulatory Visit: Payer: BLUE CROSS/BLUE SHIELD

## 2018-04-10 DIAGNOSIS — Z9049 Acquired absence of other specified parts of digestive tract: Secondary | ICD-10-CM

## 2018-04-10 DIAGNOSIS — R933 Abnormal findings on diagnostic imaging of other parts of digestive tract: Secondary | ICD-10-CM

## 2018-04-10 DIAGNOSIS — K529 Noninfective gastroenteritis and colitis, unspecified: Secondary | ICD-10-CM

## 2018-04-10 DIAGNOSIS — K5100 Ulcerative (chronic) pancolitis without complications: Secondary | ICD-10-CM

## 2018-04-10 DIAGNOSIS — R1012 Left upper quadrant pain: Secondary | ICD-10-CM

## 2018-04-11 ENCOUNTER — Other Ambulatory Visit: Payer: Self-pay

## 2018-04-11 ENCOUNTER — Encounter: Payer: Self-pay | Admitting: Physician Assistant

## 2018-04-11 ENCOUNTER — Other Ambulatory Visit: Payer: BLUE CROSS/BLUE SHIELD

## 2018-04-11 ENCOUNTER — Ambulatory Visit (INDEPENDENT_AMBULATORY_CARE_PROVIDER_SITE_OTHER): Payer: BLUE CROSS/BLUE SHIELD | Admitting: Physician Assistant

## 2018-04-11 DIAGNOSIS — K529 Noninfective gastroenteritis and colitis, unspecified: Secondary | ICD-10-CM

## 2018-04-11 DIAGNOSIS — T782XXS Anaphylactic shock, unspecified, sequela: Secondary | ICD-10-CM

## 2018-04-11 DIAGNOSIS — K5100 Ulcerative (chronic) pancolitis without complications: Secondary | ICD-10-CM

## 2018-04-11 DIAGNOSIS — F418 Other specified anxiety disorders: Secondary | ICD-10-CM | POA: Diagnosis not present

## 2018-04-11 MED ORDER — PNEUMOCOCCAL 13-VAL CONJ VACC IM SUSP: 0.5000 mL | Freq: Once | INTRAMUSCULAR | 0 refills | Status: AC

## 2018-04-11 MED ORDER — HEPATITIS A-HEP B RECOMB VAC 720-20 ELU-MCG/ML IM SUSY: PREFILLED_SYRINGE | INTRAMUSCULAR | 2 refills | Status: DC

## 2018-04-11 NOTE — Addendum Note (Signed)
Addended by: Marlon Pel on: 04/11/2018 10:33 AM   Modules accepted: Orders

## 2018-04-11 NOTE — Progress Notes (Addendum)
Virtual Visit via Video   I connected with Leslie Douglas on 04/11/18 at  7:40 AM EDT by a video enabled telemedicine application and verified that I am speaking with the correct person using two identifiers. Location patient: Home Location provider: [facility], Office Persons participating in the virtual visit: Savannha K Tor Netters, [place] , Anselmo Pickler, LPN  I discussed the limitations of evaluation and management by telemedicine and the availability of in person appointments. The patient expressed understanding and agreed to proceed.  Subjective:   HPI:   Ulcerative pancolitis She has seen GI since last seeing me, for the first time in her life, for her chronic diarrhea. She had a colonoscopy performed. Per Dr. Rush Landmark, "based on the patient's findings I do think that she likely has ulcerative colitis and based on endoscopy and biopsies she has what looks to be pan ulcerative colitis." She is planning to have an abdominal u/s to evaluate her LUQ pain. Due to several historical severe medication reactions, no medications were started for patient just yet, however biologics are being considered after further work-up. Pt is also having loss of appetite and nausea along with cramping in abdomen.  Anaphylaxis Work-up with allergist is ongoing. So far, no food allergens identified. She has several environmental allergies "grasses, ragweed, trees, indoor molds, outdoor molds, dust mites, cat and dog" and was also started on Zyrtec and Flonase.   Depression/Anxiety; Binge-Eating Disorder Continues on Lexapro 20 mg daily. At our last visit on 03/01/2018 she was recommended to see a psychiatrist but has contacted a few and has yet to find one accepting new patients. At last visit, GAD-7 was 15, PHQ-9 was 14, MDQ -- patient answered "yes" to all questions except for 10 & 11. She continues to have concerns that she has bipolar disorder. She continues to struggle with binge eating  disorder. She has seen our office therapist, Trey Paula, for counseling and is going to follow-up with her this Friday. She denies current SI/HI. She is having panic attacks at home. Feels like her mood is worsening with stay at home orders, as she already has a very limited support system.   ROS: See pertinent positives and negatives per HPI.  Patient Active Problem List   Diagnosis Date Noted  . Abnormal colonoscopy 04/04/2018  . Chronic diarrhea 04/04/2018  . Ulcerative pancolitis without complication ([place]) 76/22/6333  . IBD (inflammatory bowel disease) 04/04/2018  . History of cholecystectomy 04/04/2018  . LUQ pain 04/04/2018  . Basal cell carcinoma 10/20/2017  . Anxiety with depression 10/20/2017  . Binge eating disorder 10/20/2017    Social History   Tobacco Use  . Smoking status: Never Smoker  . Smokeless tobacco: Never Used  Substance Use Topics  . Alcohol use: Yes    Frequency: Never    Comment: rarely    Current Outpatient Medications:  .  Biotin w/ Vitamins C & E (HAIR/SKIN/NAILS PO), Take by mouth., Disp: , Rfl:  .  cetirizine (ZYRTEC) 10 MG tablet, Take 5 mg by mouth daily., Disp: , Rfl:  .  cholecalciferol (VITAMIN D3) 25 MCG (1000 UT) tablet, Take 1,000 Units by mouth daily., Disp: , Rfl:  .  EPINEPHrine 0.3 mg/0.3 mL IJ SOAJ injection, Inject 0.3 mLs (0.3 mg total) into the muscle as needed for anaphylaxis., Disp: 2 Device, Rfl: 2 .  escitalopram (LEXAPRO) 20 MG tablet, Take 1 tablet (20 mg total) by mouth daily., Disp: 90 tablet, Rfl: 3 .  Fluticasone Propionate (XHANCE) 93  MCG/ACT EXHU, Place 2 sprays into the nose 2 (two) times daily for 30 days., Disp: 16 mL, Rfl: 5 .  Multiple Vitamin (MULTIVITAMIN) tablet, Take 1 tablet by mouth daily., Disp: , Rfl:  .  triamcinolone ointment (KENALOG) 0.5 %, Apply twice daily for a week, Disp: 30 g, Rfl: 1  Allergies  Allergen Reactions  . Doxycycline Rash and Swelling    Mouth and face  . Erythromycin Shortness Of  Breath  . Sulfa Antibiotics Shortness Of Breath  . Asa [Aspirin]   . Penicillins Rash    Objective:   VITALS: Per patient if applicable, see vitals. GENERAL: Alert, appears well and in no acute distress. HEENT: Atraumatic, conjunctiva clear, no obvious abnormalities on inspection of external nose and ears. NECK: Normal movements of the head and neck. CARDIOPULMONARY: No increased WOB. Speaking in clear sentences. I:E ratio WNL.  MS: Moves all visible extremities without noticeable abnormality. PSYCH: Pleasant and cooperative, well-groomed. Speech normal rate and rhythm. Affect is appropriate. Insight and judgement are appropriate. Attention is focused, linear, and appropriate.  NEURO: CN grossly intact. Oriented as arrived to appointment on time with no prompting. Moves both UE equally.  SKIN: No obvious lesions, wounds, erythema, or cyanosis noted on face or hands.  Assessment and Plan:   Allannah was seen today for mood disorder.  Diagnoses and all orders for this visit:  Ulcerative pancolitis without complication ([place]) Management per GI.  Anaphylaxis, sequela Management per Allergy.  Anxiety with depression Uncontrolled. Worse with social distancing, denies SI/HI. Will work on getting into psych STAT. I discussed with patient that if they develop any SI, to tell someone immediately and seek medical attention.   . Reviewed expectations re: course of current medical issues. . Discussed self-management of symptoms. . Outlined signs and symptoms indicating need for more acute intervention. . Patient verbalized understanding and all questions were answered. Marland Kitchen Health Maintenance issues including appropriate healthy diet, exercise, and smoking avoidance were discussed with patient. . See orders for this visit as documented in the electronic medical record.  I discussed the assessment and treatment plan with the patient. The patient was provided an opportunity to ask questions and  all were answered. The patient agreed with the plan and demonstrated an understanding of the instructions.   The patient was advised to call back or seek an in-person evaluation if the symptoms worsen or if the condition fails to improve as anticipated.    [place], [place] 04/11/2018

## 2018-04-12 ENCOUNTER — Telehealth: Payer: Self-pay | Admitting: Physician Assistant

## 2018-04-12 LAB — TISSUE TRANSGLUTAMINASE, IGA: (tTG) Ab, IgA: 1 U/mL

## 2018-04-12 LAB — VARICELLA ZOSTER ANTIBODY, IGG: Varicella IgG: 2470 index

## 2018-04-12 LAB — HIV ANTIBODY (ROUTINE TESTING W REFLEX): HIV 1&2 Ab, 4th Generation: NONREACTIVE

## 2018-04-12 LAB — CALPROTECTIN, FECAL: Calprotectin, Fecal: 23 ug/g (ref 0–120)

## 2018-04-12 NOTE — Telephone Encounter (Signed)
Error

## 2018-04-13 LAB — QUANTIFERON-TB GOLD PLUS
Mitogen-NIL: >10 IU/mL
NIL: 0.06 IU/mL
QuantiFERON-TB Gold Plus: NEGATIVE
TB1-NIL: 0 IU/mL
TB2-NIL: 0 IU/mL

## 2018-04-13 LAB — HEPATITIS A ANTIBODY, TOTAL: Hepatitis A AB,Total: NONREACTIVE

## 2018-04-13 LAB — VITAMIN D 1,25 DIHYDROXY
Vitamin D 1, 25 (OH)2 Total: 50 pg/mL (ref 18–72)
Vitamin D2 1, 25 (OH)2: <8 pg/mL
Vitamin D3 1, 25 (OH)2: 50 pg/mL

## 2018-04-13 LAB — HEPATITIS C ANTIBODY
Hepatitis C Ab: NONREACTIVE
SIGNAL TO CUT-OFF: 0.02 (ref <1.00)

## 2018-04-13 LAB — HEPATITIS B SURFACE ANTIGEN: Hepatitis B Surface Ag: NONREACTIVE

## 2018-04-13 LAB — THIOPURINE METHYLTRANSFERASE (TPMT), RBC: Thiopurine Methyltransferase, RBC: 15 nmol/hr/mL RBC

## 2018-04-13 LAB — HEPATITIS B SURFACE ANTIBODY,QUALITATIVE: Hep B S Ab: NONREACTIVE

## 2018-04-13 LAB — HEPATITIS B CORE ANTIBODY, TOTAL: Hep B Core Total Ab: NONREACTIVE

## 2018-04-14 ENCOUNTER — Encounter: Payer: Self-pay | Admitting: Allergy & Immunology

## 2018-04-14 ENCOUNTER — Ambulatory Visit (INDEPENDENT_AMBULATORY_CARE_PROVIDER_SITE_OTHER): Payer: BLUE CROSS/BLUE SHIELD | Admitting: Psychology

## 2018-04-14 DIAGNOSIS — F321 Major depressive disorder, single episode, moderate: Secondary | ICD-10-CM

## 2018-04-14 DIAGNOSIS — R899 Unspecified abnormal finding in specimens from other organs, systems and tissues: Secondary | ICD-10-CM

## 2018-04-14 LAB — STOOL CULTURE
MICRO NUMBER:: 362224
MICRO NUMBER:: 362225
MICRO NUMBER:: 362227
SHIGA RESULT:: NOT DETECTED
SPECIMEN QUALITY:: ADEQUATE
SPECIMEN QUALITY:: ADEQUATE
SPECIMEN QUALITY:: ADEQUATE

## 2018-04-14 LAB — OVA AND PARASITE EXAMINATION
CONCENTRATE RESULT:: NONE SEEN
MICRO NUMBER:: 362226
SPECIMEN QUALITY:: ADEQUATE
TRICHROME RESULT:: NONE SEEN

## 2018-04-14 LAB — CLOSTRIDIUM DIFFICILE TOXIN B, QUALITATIVE, REAL-TIME PCR: Toxigenic C. Difficile by PCR: NOT DETECTED

## 2018-04-20 ENCOUNTER — Ambulatory Visit (INDEPENDENT_AMBULATORY_CARE_PROVIDER_SITE_OTHER): Payer: BLUE CROSS/BLUE SHIELD | Admitting: Psychology

## 2018-04-20 DIAGNOSIS — F321 Major depressive disorder, single episode, moderate: Secondary | ICD-10-CM

## 2018-04-20 LAB — CATECHOLAMINES, FRACTIONATED, URINE, 24 HOUR
Dopamine , 24H Ur: 120 ug/24 hr (ref 0–510)
Dopamine, Rand Ur: 171 ug/L
Epinephrine, 24H Ur: 4 ug/24 hr (ref 0–20)
Epinephrine, Rand Ur: 6 ug/L
Norepinephrine, 24H Ur: 38 ug/24 hr (ref 0–135)
Norepinephrine, Rand Ur: 54 ug/L

## 2018-04-20 LAB — CATECHOLAMINES, FRACTIONATED, PLASMA
Dopamine: <30 pg/mL (ref 0–48)
Epinephrine: 24 pg/mL (ref 0–62)
Norepinephrine: 198 pg/mL (ref 0–874)

## 2018-04-25 ENCOUNTER — Ambulatory Visit: Payer: Self-pay | Admitting: Gastroenterology

## 2018-04-28 ENCOUNTER — Ambulatory Visit: Payer: BLUE CROSS/BLUE SHIELD | Admitting: Psychology

## 2018-05-04 ENCOUNTER — Ambulatory Visit: Payer: Self-pay | Admitting: Gastroenterology

## 2018-05-09 ENCOUNTER — Other Ambulatory Visit: Payer: Self-pay

## 2018-05-09 ENCOUNTER — Ambulatory Visit (INDEPENDENT_AMBULATORY_CARE_PROVIDER_SITE_OTHER): Payer: BLUE CROSS/BLUE SHIELD | Admitting: Allergy & Immunology

## 2018-05-09 ENCOUNTER — Encounter: Payer: Self-pay | Admitting: Allergy & Immunology

## 2018-05-09 DIAGNOSIS — T782XXD Anaphylactic shock, unspecified, subsequent encounter: Secondary | ICD-10-CM | POA: Diagnosis not present

## 2018-05-09 DIAGNOSIS — J302 Other seasonal allergic rhinitis: Secondary | ICD-10-CM

## 2018-05-09 DIAGNOSIS — J3089 Other allergic rhinitis: Secondary | ICD-10-CM

## 2018-05-09 NOTE — Patient Instructions (Signed)
1. Seasonal and perennial allergic rhinitis (grasses, ragweed, trees, indoor molds, outdoor molds, dust mites, cat and dog) - Continue taking: Zyrtec (cetirizine) 64m tablet once daily and Flonase (fluticasone) two sprays per nostril daily as needed - You can use an extra dose of the antihistamine, if needed, for breakthrough symptoms.  - Consider nasal saline rinses 1-2 times daily to remove allergens from the nasal cavities as well as help with mucous clearance (this is especially helpful to do before the nasal sprays are given) - Consider allergy shots as a means of long-term control.  2. 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 - Consider starting Xolair monthly as an immunomodulator agent. - We will discuss more once you have your GI appointment. - Discussed Endocrinology referral, but we will table that for now  3. No follow-ups on file. This can be an in-person, a virtual Webex or a telephone follow up visit.   Please inform [place] any Emergency Department visits, hospitalizations, or changes in symptoms. Call [place] going to the ED for breathing or allergy symptoms since we might be able to fit you in for a sick visit. Feel free to contact u[place]time with any questions, problems, or concerns.  It was a pleasure to talk to you today today!  Websites that have reliable patient information: 1. American Academy of Asthma, Allergy, and Immunology: www.aaaai.org 2. Food Allergy Research and Education (FARE): foodallergy.org 3. Mothers of Asthmatics: http://www.asthmacommunitynetwork.org 4. American College of Allergy, Asthma, and Immunology: www.acaai.org  "Like" [facility] Facebook and Instagram for our latest updates!      Make sure you are registered to vote! If you have moved or changed any of your contact information, you will need to get this updated before voting!    Voter ID laws are NOT going into effect  for the General Election in November 2020! DO NOT let this stop you from exercising your right to vote!

## 2018-05-09 NOTE — Progress Notes (Signed)
RE: Leslie Douglas MRN: [id number] DOB: 1965/08/05 Date of Telemedicine Visit: 05/09/2018  Referring provider: Inda Coke, PA Primary care provider: Inda Coke, [place]  Chief Complaint: Allergies   Telemedicine Follow Up Visit via WebEx: I connected with [name] for a follow up on 05/09/18 by WebEx and verified that I am speaking with the correct person using two identifiers.   I discussed the limitations, risks, security and privacy concerns of performing an evaluation and management service by telemedicine and the availability of in person appointments. I also discussed with the patient that there may be a patient responsible charge related to this service. The patient expressed understanding and agreed to proceed.  Patient is at home accompanied by her two cats who provided/contributed to the history.  Provider is at the office.  Visit start time: 11:01 AM Visit end time: 11:26 AM Insurance consent/check in by: [employer] consent and medical assistant/nurse: Morey Hummingbird  History of Present Illness:  She is a 53 y.o. 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 Her previous allergy office visit was in March 2020 with Dr. Ernst Bowler.  She was last seen in March 2020.  This was for an evaluation of anaphylaxis.  She also had seasonal and perennial allergic rhinitis with multiple sensitizations.  Because of her 30-year history of anaphylaxis, we did obtain some lab work to rule out carcinoid syndrome and pheochromocytoma.  1 of her labs was abnormal but a repeat was completely normal.  We obtained an alpha gal panel as well, which was negative.  Since the last visit, she has done fairly well. She did have a mild reaction on the day of our testing. She thinks that her intradermal testing might have triggered it. Her swelling of her arms lasted for around two weeks. Regardless, she took some Benadryl and it resolved  on its own. She has had no reactions since that time. While she is happy that we have ruled things out, she is sad that she does not have a definitive diagnosis. Her epinephrine auto-injector is up to date. We did spend some time discussing idiopathic anaphylaxis today as well as the use of Xolair as an immunomodulator.   She is still being worked up for ulcerative colitis. She is going to see Dr. Rush Landmark later this week. Apparently there was discussion about starting a steroid sparing medication, but her drug allergies have prevented this from happening. She has not had any biologics discussed with at this point.   Allergy symptoms have been well controlled, but this is mostly because she has remained indoors.  She does have her two cats inside of her apartment which have not seemed to have bothered her. She does report that they are mowing the lawn at her place today, which is flaring her symptoms. She is not using the nose spray since she found that it seemed to make her congestion worse. She remains on the daily antihistamine.   Otherwise, there have been no changes to her past medical history, surgical history, family history, or social history.  Assessment and Plan:  Rozlynn is a 53 y.o. 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 Rush Landmark    1. Seasonal and perennial allergic rhinitis (grasses, ragweed, trees, indoor molds, outdoor molds, dust mites, cat and dog) - Continue  taking: Zyrtec (cetirizine) 43m tablet once daily and Flonase (fluticasone) two sprays per nostril daily as needed - You can use an extra dose of the antihistamine, if needed, for breakthrough symptoms.  - Consider nasal saline rinses 1-2 times daily to remove allergens from the nasal cavities as well as help with mucous  clearance (this is especially helpful to do before the nasal sprays are given) - Consider allergy shots as a means of long-term control.  2. 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 - Consider starting Xolair monthly as an immunomodulator agent. - We will discuss more once you have your GI appointment. - Discussed Endocrinology referral, but we will table that for now  3. Follow up in three months or earlier if needed. This can be an in-person, a virtual Webex or a telephone follow up visit.   Diagnostics: None.  Medication List:  Current Outpatient Medications  Medication Sig Dispense Refill  . Biotin w/ Vitamins C & E (HAIR/SKIN/NAILS PO) Take by mouth.    . cetirizine (ZYRTEC) 10 MG tablet Take 5 mg by mouth daily.    . cholecalciferol (VITAMIN D3) 25 MCG (1000 UT) tablet Take 1,000 Units by mouth daily.    .Marland KitchenEPINEPHrine 0.3 mg/0.3 mL IJ SOAJ injection Inject 0.3 mLs (0.3 mg total) into the muscle as needed for anaphylaxis. 2 Device 2  . escitalopram (LEXAPRO) 20 MG tablet Take 1 tablet (20 mg total) by mouth daily. 90 tablet 3  . Multiple Vitamin (MULTIVITAMIN) tablet Take 1 tablet by mouth daily.    .Marland Kitchentriamcinolone ointment (KENALOG) 0.5 % Apply twice daily for a week 30 g 1  . Fluticasone Propionate (XHANCE) 93 MCG/ACT EXHU Place 2 sprays into the nose 2 (two) times daily for 30 days. 16 mL 5   No current facility-administered medications for this visit.    Allergies: Allergies  Allergen Reactions  . Doxycycline Rash and Swelling    Mouth and face  . Erythromycin Shortness Of Breath  . Sulfa Antibiotics Shortness Of Breath  . Asa [Aspirin]   . Penicillins Rash   I reviewed her past medical history, social history, family history, and environmental history and no significant changes have been reported from previous visits.  Review of Systems  Constitutional: Negative for activity change, appetite  change, chills, fatigue and fever.  HENT: Positive for postnasal drip and sneezing. Negative for congestion, rhinorrhea, sinus pressure and sore throat.   Eyes: Negative for pain, discharge, redness and itching.  Respiratory: Negative for shortness of breath, wheezing and stridor.   Gastrointestinal: Negative for diarrhea, nausea and vomiting.  Musculoskeletal: Negative for arthralgias, joint swelling and myalgias.  Skin: Negative for rash.  Allergic/Immunologic: Positive for environmental allergies. Negative for food allergies.    Objective:  Physical exam not obtained as encounter was done via telephone.   Previous notes and tests were reviewed.  I discussed the assessment and treatment plan with the patient. The patient was provided an opportunity to ask questions and all were answered. The patient agreed with the plan and demonstrated an understanding of the instructions.   The patient was advised to call back or seek an in-person evaluation if the symptoms worsen or if the condition fails to improve as anticipated.  I provided 25 minutes of non-face-to-face time during this encounter.  It was my pleasure to participate in [facility] today. Please feel free to contact me with any questions or  concerns.   Sincerely,  Valentina Shaggy, MD

## 2018-05-11 ENCOUNTER — Ambulatory Visit (INDEPENDENT_AMBULATORY_CARE_PROVIDER_SITE_OTHER): Payer: BLUE CROSS/BLUE SHIELD | Admitting: Gastroenterology

## 2018-05-11 ENCOUNTER — Other Ambulatory Visit: Payer: Self-pay

## 2018-05-11 DIAGNOSIS — R112 Nausea with vomiting, unspecified: Secondary | ICD-10-CM | POA: Diagnosis not present

## 2018-05-11 DIAGNOSIS — K5100 Ulcerative (chronic) pancolitis without complications: Secondary | ICD-10-CM | POA: Diagnosis not present

## 2018-05-11 DIAGNOSIS — R1012 Left upper quadrant pain: Secondary | ICD-10-CM | POA: Diagnosis not present

## 2018-05-11 DIAGNOSIS — K529 Noninfective gastroenteritis and colitis, unspecified: Secondary | ICD-10-CM | POA: Diagnosis not present

## 2018-05-11 MED ORDER — BUDESONIDE 3 MG PO CPEP: 9.0000 mg | ORAL_CAPSULE | Freq: Every day | ORAL | 3 refills | Status: AC

## 2018-05-11 MED ORDER — ONDANSETRON 8 MG PO TBDP: 8.0000 mg | ORAL_TABLET | Freq: Three times a day (TID) | ORAL | 2 refills | Status: DC | PRN

## 2018-05-11 NOTE — Progress Notes (Signed)
[place] VISIT   Primary Care Provider Inda Coke, [place] [id number] [phone]  Patient Profile: Leslie Douglas is a 53 y.o. female with a pmh significant for hx of BCC of the skin, Anxiety/MDD, COPD, Unclear Anaphylactoid reactions, Chronic Diarrhea with now working diagnosis of UC.  The patient presents to the [facility] for an evaluation and management of problem(s) noted below:  Problem List 1. Ulcerative pancolitis without complication ([place])   2. IBD (inflammatory bowel disease)   3. LUQ pain   4. Nausea and vomiting, intractability of vomiting not specified, unspecified vomiting type     I connected with  Evelene Croon on 05/13/18. I verified that I was speaking with the correct person using two identifiers. Due to the COVID-19 Pandemic, this service was provided via telemedicine using audio/visual media. The patient was located at home. The provider was located in the office. The patient did consent to this visit and is aware of charges through their insurance as well as the limitations of evaluation and management by telemedicine. Other persons participating in this telemedicine service were none.   History of Present Illness: Please see initial consultation note for full details of HPI.  Interval History Since we last spoke with one another the patient has undergone laboratory evaluation in effort of getting her ready for treatment for ulcerative pancolitis.  We have not initiated any therapy as of yet which is the reasoning for today's clinic visit. IBD diary is as below: BMs per day - 6-8 Nocturnal BMs - None Blood - None Mucous - Sometimes Tenesmus - Denies Urgency - 50% of the time Incomplete evaluation - 20-30% Skin Manifestations - No overt EN or PG have been noted on skin Eye Manifestations -no changes other than blurry vision Joint Manifestations -stable  issues with knee/hip/shoulder  Patient is ready and willing to initiate therapy for ulcerative colitis if we feel it is necessary at this point in time.  She did have recurrence of the left upper quadrant abdominal discomfort and is wondering if any imaging will need to be performed as we have previously discussed.  That discomfort came about and was not prandial related.  She has no nausea or vomiting currently but did have some at time of her pain episode.  Weight has been stable.   GI Review of Systems Positive as above including pyrosis infrequently Negative for dysphagia, odynophagia, melena, hematochezia, bloating   Review of Systems General: Denies fevers/chills HEENT: Denies oral lesions Cardiovascular: Denies chest pain Pulmonary: Denies shortness of breath Gastroenterological: See HPI Genitourinary: Denies darkened urine Hematological: Denies easy bruising Dermatological: As per HPI Psychological: Mood is stable Allergy & Immunology: Recently seen by her allergist who is considering the use of immunomodulators Xolair Musculoskeletal: As per HPI   Medications Current Outpatient Medications  Medication Sig Dispense Refill  . Biotin w/ Vitamins C & E (HAIR/SKIN/NAILS PO) Take by mouth.    . budesonide (ENTOCORT EC) 3 MG 24 hr capsule Take 3 capsules (9 mg total) by mouth daily for 30 days. 90 capsule 3  . cetirizine (ZYRTEC) 10 MG tablet Take 5 mg by mouth daily.    . cholecalciferol (VITAMIN D3) 25 MCG (1000 UT) tablet Take 1,000 Units by mouth daily.    Marland Kitchen EPINEPHrine 0.3 mg/0.3 mL IJ SOAJ injection Inject 0.3 mLs (0.3 mg total) into the muscle as needed for anaphylaxis. 2 Device 2  . escitalopram (LEXAPRO) 20 MG tablet Take  1 tablet (20 mg total) by mouth daily. 90 tablet 3  . Fluticasone Propionate (XHANCE) 93 MCG/ACT EXHU Place 2 sprays into the nose 2 (two) times daily for 30 days. 16 mL 5  . Multiple Vitamin (MULTIVITAMIN) tablet Take 1 tablet by mouth daily.    .  ondansetron (ZOFRAN ODT) 8 MG disintegrating tablet Take 1 tablet (8 mg total) by mouth every 8 (eight) hours as needed for nausea or vomiting. 20 tablet 2  . triamcinolone ointment (KENALOG) 0.5 % Apply twice daily for a week 30 g 1   No current facility-administered medications for this visit.     Allergies Allergies  Allergen Reactions  . Doxycycline Rash and Swelling    Mouth and face  . Erythromycin Shortness Of Breath  . Sulfa Antibiotics Shortness Of Breath  . Asa [Aspirin]   . Penicillins Rash    Histories Past Medical History:  Diagnosis Date  . Allergy   . Anxiety   . Binge eating disorder   . Cancer ([employer])    basal cell  . COPD (chronic obstructive pulmonary disease) ([facility])    chronic bronchitis  . Depression   . History of chicken pox    Past Surgical History:  Procedure Laterality Date  . CHOLECYSTECTOMY  2003  . MOHS SURGERY  10/2017  . VAGINAL DELIVERY  1995  . WISDOM TOOTH EXTRACTION  12/2017   Social History   Socioeconomic History  . Marital status: Legally Separated    Spouse name: Not on file  . Number of children: 1  . Years of education: Not on file  . Highest education level: Not on file  Occupational History  . Not on file  Social Needs  . Financial resource strain: Not on file  . Food insecurity:    Worry: Not on file    Inability: Not on file  . Transportation needs:    Medical: Not on file    Non-medical: Not on file  Tobacco Use  . Smoking status: Never Smoker  . Smokeless tobacco: Never Used  Substance and Sexual Activity  . Alcohol use: Yes    Frequency: Never    Comment: rarely  . Drug use: Never  . Sexual activity: Not Currently    Birth control/protection: None  Lifestyle  . Physical activity:    Days per week: Not on file    Minutes per session: Not on file  . Stress: Not on file  Relationships  . Social connections:    Talks on phone: Not on file    Gets together: Not on file    Attends religious service: Not  on file    Active member of club or organization: Not on file    Attends meetings of clubs or organizations: Not on file    Relationship status: Not on file  . Intimate partner violence:    Fear of current or ex partner: Not on file    Emotionally abused: Not on file    Physically abused: Not on file    Forced sexual activity: Not on file  Other Topics Concern  . Not on file  Social History Narrative   Divorced   Works for [employer], has Associate Degree   3 children   Family History  Problem Relation Age of Onset  . Hypertension Mother   . Colon cancer Neg Hx   . Esophageal cancer Neg Hx   . Rectal cancer Neg Hx   . Stomach cancer Neg Hx   .  Pancreatic cancer Neg Hx   . Inflammatory bowel disease Neg Hx   . Liver disease Neg Hx   . Allergic rhinitis Neg Hx   . Angioedema Neg Hx   . Asthma Neg Hx   . Eczema Neg Hx   . Urticaria Neg Hx   . Immunodeficiency Neg Hx    I have reviewed her medical, social, and family history in detail and updated the electronic medical record as necessary.    PHYSICAL EXAMINATION  NONE Telehealth Visit   REVIEW OF DATA  I reviewed the following data at the time of this encounter:  GI Procedures and Studies  Previously reviewed  Laboratory Studies  Reviewed in epic  Imaging Studies  No relevant studies to review   ASSESSMENT  Ms. Labella is a 53 y.o. female with a pmh significant for hx of BCC of the skin, Anxiety/MDD, COPD, Unclear Anaphylactoid reactions, Chronic Diarrhea with now working diagnosis of UC.  The patient is seen today for evaluation and management of:  1. Ulcerative pancolitis without complication ([place])   2. IBD (inflammatory bowel disease)   3. LUQ pain   4. Nausea and vomiting, intractability of vomiting not specified, unspecified vomiting type    At this point, I do think it is reasonable for [name] to consider getting her on therapy.  After discussion with a few of my colleagues we think based on the  patient's normal inflammatory markers and long-term symptoms without overt hematochezia or weight loss for protein loss enteropathy but it may be reasonable to consider the use of budesonide.  I like to initiate that on the patient rather than the immunomodulator if possible or biologic if possible.  However, the patient understands the risks that go along with steroid therapy and I would like to try and minimize use of prednisone if possible however we may consider it in the future.  If we cannot get approval for budesonide which I plan to use at 9 mg daily for at least 1 to 2 months and taper as able to a low dose with plan for repeat colonoscopy to evaluate for improvement in chronic colitis pathology then we will consider the role of azathioprine and a low-dose steroid to try and build her up.  Biologic agent such as Remicade or Humira may be considered in the future as well based on how the patient does but I think I would need to see that she failed oral steroids for moving forward with that type of therapy.  If we end up using immunomodulator therapy we would consider the use of concrement and steroids.  I have no concern about the use of Xolair if necessary from an immunomodulatory perspective for her anaphylactic reactions it should not have any issues if we are only using budesonide.  However, if we end up considering the use of azathioprine or 6-MP I will have to have a discussion with her allergist.  Her laboratories which were previously obtained were in an effort of optimization prior to initiation of immune modulating/immunosuppressive therapy.  As she has continued to have recurrent left upper quadrant discomfort as well as associated nausea with vomiting we will move forward with abdominal imaging as outlined below.  I would also like to obtain H. pylori evaluation.  I would also like to send her with an antiemetic to use if necessary at a period of significant nausea/vomiting.  We are holding on  PPI therapy for now.  All patient questions were answered, to the best  of my ability, and the patient agrees to the aforementioned plan of action with follow-up as indicated.   PLAN   Orders Placed This Encounter  Procedures  . Helicobacter pylori special antigen  . US Abdomen Complete  . Cortisol  . Hepatitis B Core Antibody, total   Laboratories as outlined above We will initiate budesonide 9 mg daily with plan for taper in the long-term If unable to afford budesonide for insurance issues will consider prior authorization and/or other medications May have to consider immunomodulators such as azathioprine or 6-MP with steroids complement Patient referred to Crohn's and colitis website and will review the possibility of Humira or Remicade Abdominal ultrasound ordered to evaluate left upper quadrant discomfort   New Prescriptions   BUDESONIDE (ENTOCORT EC) 3 MG 24 HR CAPSULE    Take 3 capsules (9 mg total) by mouth daily for 30 days.   ONDANSETRON (ZOFRAN ODT) 8 MG DISINTEGRATING TABLET    Take 1 tablet (8 mg total) by mouth every 8 (eight) hours as needed for nausea or vomiting.   Modified Medications   No medications on file    Planned Follow Up: Return in about 3 weeks (around 06/01/2018).   Justice Britain, MD [facility] Office # [phone]

## 2018-05-11 NOTE — Patient Instructions (Addendum)
You will be scheduled for an abdominal ultrasound at  [place]. Please arrive 15 minutes prior to your appointment for registration. Make certain not to have anything to eat or drink 6 hours prior to your appointment. Should you need to reschedule your appointment, please contact radiology at [phone] This test typically takes about 30 minutes to perform.  Your provider has requested that you go to the basement level for lab work before leaving today. Press "B" on the elevator. The lab is located at the first door on the left as you exit the elevator.  We have sent the following medications to your pharmacy for you to pick up at your convenience: Zofran , Budesonide  Submit stool studies before starting medications as instructed by Dr Rush Landmark.    Thank you for choosing me and [facility].  Dr. Rush Landmark

## 2018-05-12 ENCOUNTER — Ambulatory Visit (INDEPENDENT_AMBULATORY_CARE_PROVIDER_SITE_OTHER): Payer: BLUE CROSS/BLUE SHIELD | Admitting: Psychology

## 2018-05-12 DIAGNOSIS — F321 Major depressive disorder, single episode, moderate: Secondary | ICD-10-CM

## 2018-05-13 ENCOUNTER — Other Ambulatory Visit: Payer: Self-pay | Admitting: Physician Assistant

## 2018-05-13 ENCOUNTER — Encounter: Payer: Self-pay | Admitting: Gastroenterology

## 2018-05-13 DIAGNOSIS — R112 Nausea with vomiting, unspecified: Secondary | ICD-10-CM | POA: Insufficient documentation

## 2018-05-14 ENCOUNTER — Encounter: Payer: Self-pay | Admitting: Physician Assistant

## 2018-05-14 DIAGNOSIS — T782XXA Anaphylactic shock, unspecified, initial encounter: Secondary | ICD-10-CM | POA: Insufficient documentation

## 2018-05-14 NOTE — Progress Notes (Signed)
Virtual Visit via Video   I connected with Leslie Douglas on 05/15/18 at 10:00 AM EDT by a video enabled telemedicine application and verified that I am speaking with the correct person using two identifiers. Location patient: Home Location provider: [facility], Office Persons participating in the virtual visit: Ednah K [place], [place]   I discussed the limitations of evaluation and management by telemedicine and the availability of in person appointments. The patient expressed understanding and agreed to proceed.  Subjective:   HPI:   Anaphylaxis  Seen by Dr. Ernst Bowler with Asthma and Allergy on 05/09/18. She is considering Xolair monthly for immunomodulator therapy. She was also continued on Flonase and Zyrtec.  Pancolitis Seen by Dr. Rush Landmark on 05/11/18. Based on chart review, he is planning to start Budesonide at 9 mg daily with plans to eventually taper. Due to intermittent LUQ pain, n/v, he is also planning to obtain abdominal u/s for further evaluation and treatment, as well as pursuing H. Pylori work-up.  Anxiety and Depression She has pending appointment with psychiatrist, Dr. Daron Offer, on 05/24/18. She continues to see Trey Paula, LCSW for therapy. She continues on Lexapro but doesn't think that it is working well for her at this time. Denies SI/HI.   ROS: See pertinent positives and negatives per HPI.  Patient Active Problem List   Diagnosis Date Noted   Anaphylaxis 05/14/2018   Nausea and vomiting 05/13/2018   Abnormal colonoscopy 04/04/2018   Chronic diarrhea 04/04/2018   Ulcerative pancolitis without complication ([place]) 44/96/7591   IBD (inflammatory bowel disease) 04/04/2018   History of cholecystectomy 04/04/2018   LUQ pain 04/04/2018   Basal cell carcinoma 10/20/2017   Anxiety with depression 10/20/2017   Binge eating disorder 10/20/2017    Social History   Tobacco Use   Smoking status: Never Smoker   Smokeless tobacco: Never  Used  Substance Use Topics   Alcohol use: Yes    Frequency: Never    Comment: rarely    Current Outpatient Medications:    Biotin w/ Vitamins C & E (HAIR/SKIN/NAILS PO), Take by mouth., Disp: , Rfl:    cetirizine (ZYRTEC) 10 MG tablet, Take 5 mg by mouth daily., Disp: , Rfl:    cholecalciferol (VITAMIN D3) 25 MCG (1000 UT) tablet, Take 1,000 Units by mouth daily., Disp: , Rfl:    EPINEPHrine 0.3 mg/0.3 mL IJ SOAJ injection, Inject 0.3 mLs (0.3 mg total) into the muscle as needed for anaphylaxis., Disp: 2 Device, Rfl: 2   escitalopram (LEXAPRO) 20 MG tablet, Take 1 tablet (20 mg total) by mouth daily., Disp: 90 tablet, Rfl: 3   Multiple Vitamin (MULTIVITAMIN) tablet, Take 1 tablet by mouth daily., Disp: , Rfl:    triamcinolone ointment (KENALOG) 0.5 %, Apply twice daily for a week (Patient taking differently: Apply 1 application topically as needed. Apply twice daily for a week), Disp: 30 g, Rfl: 1   budesonide (ENTOCORT EC) 3 MG 24 hr capsule, Take 3 capsules (9 mg total) by mouth daily for 30 days. (Patient not taking: Reported on 05/15/2018), Disp: 90 capsule, Rfl: 3   Fluticasone Propionate (XHANCE) 93 MCG/ACT EXHU, Place 2 sprays into the nose 2 (two) times daily for 30 days., Disp: 16 mL, Rfl: 5   ondansetron (ZOFRAN ODT) 8 MG disintegrating tablet, Take 1 tablet (8 mg total) by mouth every 8 (eight) hours as needed for nausea or vomiting. (Patient not taking: Reported on 05/15/2018), Disp: 20 tablet, Rfl: 2  Allergies  Allergen Reactions  Doxycycline Rash and Swelling    Mouth and face   Erythromycin Shortness Of Breath   Sulfa Antibiotics Shortness Of Breath   Asa [Aspirin]    Penicillins Rash    Objective:   VITALS: Per patient if applicable, see vitals. GENERAL: Alert, appears well and in no acute distress. HEENT: Atraumatic, conjunctiva clear, no obvious abnormalities on inspection of external nose and ears. NECK: Normal movements of the head and  neck. CARDIOPULMONARY: No increased WOB. Speaking in clear sentences. I:E ratio WNL.  MS: Moves all visible extremities without noticeable abnormality. PSYCH: Pleasant and cooperative, well-groomed. Speech normal rate and rhythm. Affect is appropriate. Insight and judgement are appropriate. Attention is focused, linear, and appropriate.  NEURO: CN grossly intact. Oriented as arrived to appointment on time with no prompting. Moves both UE equally.  SKIN: No obvious lesions, wounds, erythema, or cyanosis noted on face or hands.  Assessment and Plan:   Brownie was seen today for anaphylaxis, ulcerative colitis, anxiety and depression.  Diagnoses and all orders for this visit:  Anaphylaxis, sequela Management per Asthma/Allergy.  Anxiety with depression Uncontrolled. I discussed with patient that if they develop any SI, to tell someone immediately and seek medical attention. Seeing Dr. Daron Offer on 5/13. Will defer to him for further management of medications.  Ulcerative pancolitis without complication ([place]) Management per GI.   Reviewed expectations re: course of current medical issues.  Discussed self-management of symptoms.  Outlined signs and symptoms indicating need for more acute intervention.  Patient verbalized understanding and all questions were answered.  Health Maintenance issues including appropriate healthy diet, exercise, and smoking avoidance were discussed with patient.  See orders for this visit as documented in the electronic medical record.  I discussed the assessment and treatment plan with the patient. The patient was provided an opportunity to ask questions and all were answered. The patient agreed with the plan and demonstrated an understanding of the instructions.   The patient was advised to call back or seek an in-person evaluation if the symptoms worsen or if the condition fails to improve as anticipated.    [place], [place] 05/15/2018

## 2018-05-15 ENCOUNTER — Encounter: Payer: Self-pay | Admitting: Physician Assistant

## 2018-05-15 ENCOUNTER — Other Ambulatory Visit (INDEPENDENT_AMBULATORY_CARE_PROVIDER_SITE_OTHER): Payer: BLUE CROSS/BLUE SHIELD

## 2018-05-15 ENCOUNTER — Ambulatory Visit (INDEPENDENT_AMBULATORY_CARE_PROVIDER_SITE_OTHER): Payer: BLUE CROSS/BLUE SHIELD | Admitting: Physician Assistant

## 2018-05-15 VITALS — Ht 64.5 in | Wt 280.2 lb

## 2018-05-15 DIAGNOSIS — K5100 Ulcerative (chronic) pancolitis without complications: Secondary | ICD-10-CM | POA: Diagnosis not present

## 2018-05-15 DIAGNOSIS — R1012 Left upper quadrant pain: Secondary | ICD-10-CM | POA: Diagnosis not present

## 2018-05-15 DIAGNOSIS — T782XXS Anaphylactic shock, unspecified, sequela: Secondary | ICD-10-CM

## 2018-05-15 DIAGNOSIS — K529 Noninfective gastroenteritis and colitis, unspecified: Secondary | ICD-10-CM

## 2018-05-15 DIAGNOSIS — F418 Other specified anxiety disorders: Secondary | ICD-10-CM | POA: Diagnosis not present

## 2018-05-15 DIAGNOSIS — R112 Nausea with vomiting, unspecified: Secondary | ICD-10-CM

## 2018-05-15 LAB — CORTISOL: Cortisol, Plasma: 7.7 ug/dL

## 2018-05-16 ENCOUNTER — Other Ambulatory Visit: Payer: BLUE CROSS/BLUE SHIELD

## 2018-05-16 DIAGNOSIS — R112 Nausea with vomiting, unspecified: Secondary | ICD-10-CM

## 2018-05-16 DIAGNOSIS — K529 Noninfective gastroenteritis and colitis, unspecified: Secondary | ICD-10-CM

## 2018-05-16 DIAGNOSIS — R1012 Left upper quadrant pain: Secondary | ICD-10-CM

## 2018-05-16 DIAGNOSIS — K5100 Ulcerative (chronic) pancolitis without complications: Secondary | ICD-10-CM

## 2018-05-16 LAB — HEPATITIS B CORE ANTIBODY, TOTAL: Hep B Core Total Ab: NONREACTIVE

## 2018-05-17 LAB — HELICOBACTER PYLORI  SPECIAL ANTIGEN
MICRO NUMBER:: 446449
SPECIMEN QUALITY: ADEQUATE

## 2018-05-22 ENCOUNTER — Other Ambulatory Visit: Payer: Self-pay

## 2018-05-22 ENCOUNTER — Ambulatory Visit
Admission: RE | Admit: 2018-05-22 | Discharge: 2018-05-22 | Disposition: A | Discharge status: Home / Self Care | Payer: BLUE CROSS/BLUE SHIELD | Source: Ambulatory Visit | Attending: Gastroenterology | Admitting: Gastroenterology

## 2018-05-22 DIAGNOSIS — K529 Noninfective gastroenteritis and colitis, unspecified: Secondary | ICD-10-CM

## 2018-05-22 DIAGNOSIS — R112 Nausea with vomiting, unspecified: Secondary | ICD-10-CM

## 2018-05-22 DIAGNOSIS — K5100 Ulcerative (chronic) pancolitis without complications: Secondary | ICD-10-CM

## 2018-05-22 DIAGNOSIS — R1012 Left upper quadrant pain: Secondary | ICD-10-CM

## 2018-05-31 ENCOUNTER — Ambulatory Visit (INDEPENDENT_AMBULATORY_CARE_PROVIDER_SITE_OTHER): Payer: BLUE CROSS/BLUE SHIELD | Admitting: Psychology

## 2018-05-31 DIAGNOSIS — F321 Major depressive disorder, single episode, moderate: Secondary | ICD-10-CM

## 2018-06-12 ENCOUNTER — Telehealth: Payer: Self-pay

## 2018-06-12 NOTE — Telephone Encounter (Signed)
-----   Message from Algernon Huxley, RN sent at 06/06/2018  4:10 PM EDT -----  ----- Message ----- From: Marlon Pel, RN Sent: 06/06/2018 To: Timothy Lasso, RN  Leslie Douglas patient needs PPSV-23 aand hepatic panel.  Orders are not entered for either.    Sheri    Mansouraty, Telford Nab., MD sent to Timothy Lasso, RN    Ramisa Duman or covering RN,  I have released the majority of her lab results via my chart.   I am not sure why the HIV and VZV labs were not performed as these should be done before we initiate any immune suppression if possible. I will try and see if we can call the lab to see why they were not done or if they can be added on and if not then she will need to come in for some labs.   We are still awaiting the TPMT test results which can take up to a week to 2 weeks to return.  She will need to have a repeat hepatic function panel drawn in approximately 2 to 3 months.  She is not immune to hepatitis A/hepatitis B.  I think she would benefit from being initiated on hepatitis A/hepatitis B vaccine series.  I would talk with her about that and let her know that we recommend this in the setting of someone who may be going on immune suppression soon.  The patient should also have a pneumococcal vaccine PCV-13 followed by PPSV-23 8 weeks later if she has not already had this. This particular vaccine can be done by her primary care provider. It is most ideal to initiate these before we start her on immune suppression if possible.  Thank you.  GM

## 2018-06-12 NOTE — Telephone Encounter (Signed)
Need to ensure that the pt received her pneumo 13 at the pharmacy per lab result 3/27. If so, she need to have pneumo 23 now.  No answer no voice mail

## 2018-06-13 ENCOUNTER — Other Ambulatory Visit: Payer: Self-pay

## 2018-06-13 NOTE — Telephone Encounter (Signed)
Tried to call pt no answer an no voice mail.  Will send a message via My Chart

## 2018-06-22 ENCOUNTER — Ambulatory Visit (INDEPENDENT_AMBULATORY_CARE_PROVIDER_SITE_OTHER): Payer: BC Managed Care – PPO | Admitting: Psychology

## 2018-06-22 DIAGNOSIS — F321 Major depressive disorder, single episode, moderate: Secondary | ICD-10-CM | POA: Diagnosis not present

## 2018-06-26 ENCOUNTER — Telehealth: Payer: Self-pay | Admitting: *Deleted

## 2018-06-26 ENCOUNTER — Ambulatory Visit (INDEPENDENT_AMBULATORY_CARE_PROVIDER_SITE_OTHER): Payer: BC Managed Care – PPO | Admitting: Physician Assistant

## 2018-06-26 ENCOUNTER — Encounter: Payer: Self-pay | Admitting: Physician Assistant

## 2018-06-26 DIAGNOSIS — R197 Diarrhea, unspecified: Secondary | ICD-10-CM

## 2018-06-26 NOTE — Progress Notes (Signed)
Virtual Visit via Video   I connected with Leslie Douglas on 06/26/18 at  2:40 PM EDT by a video enabled telemedicine application and verified that I am speaking with the correct person using two identifiers. Location patient: Home Location provider: [facility], Office Persons participating in the virtual visit: Leslie Douglas, [place] PA-C.  I discussed the limitations of evaluation and management by telemedicine and the availability of in person appointments. The patient expressed understanding and agreed to proceed.  I acted as a Education administrator for [employer], [employer], LPN Subjective:   HPI:  Diarrhea and vomiting Pt needing a note for work today due to having diarrhea and vomiting. She has these symptoms regularly, about once a month. She feels as though this is her usual symptoms, does have diaphoresis with symptoms. Denies: fever, chills, cough, SOB. She has been working in the office for about 4 weeks now. Denies any known COVID-19 contacts. Denies hematemesis.  She is seeing GI for her UC but she is not currently on budesonide as she was worried about this worsening her mood. Able to hydrate and eat bland foods.  ROS: See pertinent positives and negatives per HPI.  Patient Active Problem List   Diagnosis Date Noted  . Anaphylaxis 05/14/2018  . Nausea and vomiting 05/13/2018  . Abnormal colonoscopy 04/04/2018  . Chronic diarrhea 04/04/2018  . Ulcerative pancolitis without complication ([place]) 01/19/3233  . IBD (inflammatory bowel disease) 04/04/2018  . History of cholecystectomy 04/04/2018  . LUQ pain 04/04/2018  . Basal cell carcinoma 10/20/2017  . Anxiety with depression 10/20/2017  . Binge eating disorder 10/20/2017    Social History   Tobacco Use  . Smoking status: Never Smoker  . Smokeless tobacco: Never Used  Substance Use Topics  . Alcohol use: Yes    Frequency: Never    Comment: rarely    Current Outpatient Medications:  .   amitriptyline (ELAVIL) 100 MG tablet, , Disp: , Rfl:  .  Biotin w/ Vitamins C & E (HAIR/SKIN/NAILS PO), Take by mouth., Disp: , Rfl:  .  cetirizine (ZYRTEC) 10 MG tablet, Take 5 mg by mouth daily., Disp: , Rfl:  .  cholecalciferol (VITAMIN D3) 25 MCG (1000 UT) tablet, Take 1,000 Units by mouth daily., Disp: , Rfl:  .  EPINEPHrine 0.3 mg/0.3 mL IJ SOAJ injection, Inject 0.3 mLs (0.3 mg total) into the muscle as needed for anaphylaxis., Disp: 2 Device, Rfl: 2 .  Multiple Vitamin (MULTIVITAMIN) tablet, Take 1 tablet by mouth daily., Disp: , Rfl:  .  ondansetron (ZOFRAN ODT) 8 MG disintegrating tablet, Take 1 tablet (8 mg total) by mouth every 8 (eight) hours as needed for nausea or vomiting., Disp: 20 tablet, Rfl: 2 .  triamcinolone ointment (KENALOG) 0.5 %, Apply twice daily for a week (Patient taking differently: Apply 1 application topically as needed. Apply twice daily for a week), Disp: 30 g, Rfl: 1  Allergies  Allergen Reactions  . Doxycycline Rash and Swelling    Mouth and face  . Erythromycin Shortness Of Breath  . Sulfa Antibiotics Shortness Of Breath  . Asa [Aspirin]   . Penicillins Rash    Objective:   VITALS: Per patient if applicable, see vitals. GENERAL: Alert, appears well and in no acute distress. HEENT: Atraumatic, conjunctiva clear, no obvious abnormalities on inspection of external nose and ears. NECK: Normal movements of the head and neck. CARDIOPULMONARY: No increased WOB. Speaking in clear sentences. I:E ratio WNL.  MS: Moves all  visible extremities without noticeable abnormality. PSYCH: Pleasant and cooperative, well-groomed. Speech normal rate and rhythm. Affect is appropriate. Insight and judgement are appropriate. Attention is focused, linear, and appropriate.  NEURO: CN grossly intact. Oriented as arrived to appointment on time with no prompting. Moves both UE equally.  SKIN: No obvious lesions, wounds, erythema, or cyanosis noted on face or hands.  Assessment  and Plan:   Leslie Douglas was seen today for diarrhea and vomiting.  Diagnoses and all orders for this visit:  Diarrhea, unspecified type   Although diarrhea is chronic for her, this is an acute episode. Likely UC flare, however I cannot r/o COVID-19 and I cannot safely have her return to work until result is back. She is agreeable to testing, due to emergent need for results, will try to get same day testing at [facility]. I did offer drive-up testing but she declined at this time. I did provide note allowing her to be able to work from home in the interim.  . Reviewed expectations re: course of current medical issues. . Discussed self-management of symptoms. . Outlined signs and symptoms indicating need for more acute intervention. . Patient verbalized understanding and all questions were answered. Marland Kitchen Health Maintenance issues including appropriate healthy diet, exercise, and smoking avoidance were discussed with patient. . See orders for this visit as documented in the electronic medical record.  I discussed the assessment and treatment plan with the patient. The patient was provided an opportunity to ask questions and all were answered. The patient agreed with the plan and demonstrated an understanding of the instructions.   The patient was advised to call back or seek an in-person evaluation if the symptoms worsen or if the condition fails to improve as anticipated.   CMA or LPN served as scribe during this visit. History, Physical, and Plan performed by medical provider. The above documentation has been reviewed and is accurate and complete.  [place], [place] 06/26/2018

## 2018-06-26 NOTE — Telephone Encounter (Signed)
See note

## 2018-06-26 NOTE — Telephone Encounter (Signed)
Patient says she was unable to find [facility] testing site. Wants [facility] to put an order to [facility].

## 2018-06-26 NOTE — Telephone Encounter (Signed)
Spoke to pt told her she has two options for COVID testing can go to [facility] go online and register looks like there is a 3 hour wait at present or [facility] but will take 1- 4 days for testing to come back. Pt verbalized understanding and said she will go to [facility]. Told her okay she will have to let us know results cause they will contact you. Pt verbalized understanding. Also Aldona Bar said she can give you a note for today and to work from home until testing is back. Pt said that would be fine.   Samantha notified what pt is doing and about note. She will send not thru My Chart.

## 2018-06-27 ENCOUNTER — Other Ambulatory Visit: Payer: Self-pay

## 2018-06-27 ENCOUNTER — Telehealth: Payer: Self-pay | Admitting: General Practice

## 2018-06-27 DIAGNOSIS — Z20822 Contact with and (suspected) exposure to covid-19: Secondary | ICD-10-CM

## 2018-06-27 NOTE — Telephone Encounter (Signed)
I have asked the [facility] to schedule testing. Someone should be in touch with her today.

## 2018-06-27 NOTE — Telephone Encounter (Signed)
Please call patient and schedule drive-up [id number] testing.  Inda Coke PA-C

## 2018-06-27 NOTE — Telephone Encounter (Signed)
Pt has been scheduled for Covid testing.   Scheduled with pt directly.  Pt was referred by: Inda Coke, [place]

## 2018-06-27 NOTE — Addendum Note (Signed)
Addended by: Denman George on: 06/27/2018 09:47 AM   Modules accepted: Orders

## 2018-06-27 NOTE — Telephone Encounter (Signed)
Spoke to pt told her Leslie Douglas put order in for [facility] to order testing and someone will be in touch with you today. Pt said they have and she is on her way now. Told her okay.

## 2018-06-28 LAB — NOVEL CORONAVIRUS, NAA: SARS-CoV-2, NAA: NOT DETECTED

## 2018-06-29 ENCOUNTER — Encounter: Payer: Self-pay | Admitting: Physician Assistant

## 2018-06-29 ENCOUNTER — Telehealth: Payer: Self-pay

## 2018-06-29 NOTE — Telephone Encounter (Signed)
Copied from [place] [id number]. Topic: General - Inquiry >> Jun 29, 2018 10:30 AM Scherrie Gerlach wrote: Reason for CRM:  pt requesting work note to return to work, as her covid was negative. Ok to put on mychart please Pt needs TODAY.  She wants to go back to work today, as she has a project to [employer]

## 2018-06-29 NOTE — Telephone Encounter (Signed)
Done, pt got it. Responded thru My Chart.

## 2018-07-03 ENCOUNTER — Ambulatory Visit (INDEPENDENT_AMBULATORY_CARE_PROVIDER_SITE_OTHER): Payer: BC Managed Care – PPO | Admitting: Gastroenterology

## 2018-07-03 DIAGNOSIS — Z23 Encounter for immunization: Secondary | ICD-10-CM

## 2018-07-04 ENCOUNTER — Ambulatory Visit (INDEPENDENT_AMBULATORY_CARE_PROVIDER_SITE_OTHER): Payer: BC Managed Care – PPO | Admitting: Psychology

## 2018-07-04 DIAGNOSIS — F321 Major depressive disorder, single episode, moderate: Secondary | ICD-10-CM

## 2018-07-28 ENCOUNTER — Ambulatory Visit: Payer: BC Managed Care – PPO | Admitting: Psychology

## 2018-08-02 ENCOUNTER — Telehealth: Payer: Self-pay

## 2018-08-02 NOTE — Telephone Encounter (Signed)
-----   Message from Timothy Lasso, RN sent at 07/12/2018  9:30 AM EDT ----- Regarding: FW: Follow-up  ----- Message ----- From: Irving Copas., MD Sent: 07/11/2018   5:39 PM EDT To: Timothy Lasso, RN Subject: Follow-up                                      Siya Flurry,Please see the my chart message that I have replied to the patient.But shoot for a follow-up in clinic in approximately 3 to 6 weeks.She should stay off budesonide.We will hold on putting it as an allergy for now.Thank you.GM

## 2018-08-02 NOTE — Telephone Encounter (Signed)
appt made and message sent to pt via My Chart with the information

## 2018-08-04 ENCOUNTER — Ambulatory Visit (INDEPENDENT_AMBULATORY_CARE_PROVIDER_SITE_OTHER): Payer: BC Managed Care – PPO | Admitting: Psychology

## 2018-08-04 DIAGNOSIS — F321 Major depressive disorder, single episode, moderate: Secondary | ICD-10-CM

## 2018-08-08 ENCOUNTER — Ambulatory Visit (INDEPENDENT_AMBULATORY_CARE_PROVIDER_SITE_OTHER): Payer: BC Managed Care – PPO | Admitting: Gastroenterology

## 2018-08-08 ENCOUNTER — Ambulatory Visit (INDEPENDENT_AMBULATORY_CARE_PROVIDER_SITE_OTHER): Payer: BC Managed Care – PPO | Admitting: Allergy & Immunology

## 2018-08-08 ENCOUNTER — Encounter: Payer: Self-pay | Admitting: Allergy & Immunology

## 2018-08-08 ENCOUNTER — Other Ambulatory Visit: Payer: Self-pay

## 2018-08-08 VITALS — BP 102/82 | HR 109 | Temp 97.6°F | Resp 16 | Ht 65.75 in | Wt 284.0 lb

## 2018-08-08 DIAGNOSIS — J302 Other seasonal allergic rhinitis: Secondary | ICD-10-CM

## 2018-08-08 DIAGNOSIS — K5100 Ulcerative (chronic) pancolitis without complications: Secondary | ICD-10-CM

## 2018-08-08 DIAGNOSIS — T782XXD Anaphylactic shock, unspecified, subsequent encounter: Secondary | ICD-10-CM | POA: Diagnosis not present

## 2018-08-08 DIAGNOSIS — F418 Other specified anxiety disorders: Secondary | ICD-10-CM | POA: Diagnosis not present

## 2018-08-08 DIAGNOSIS — Z23 Encounter for immunization: Secondary | ICD-10-CM | POA: Diagnosis not present

## 2018-08-08 DIAGNOSIS — J3089 Other allergic rhinitis: Secondary | ICD-10-CM | POA: Diagnosis not present

## 2018-08-08 MED ORDER — FLUTICASONE PROPIONATE 50 MCG/ACT NA SUSP: 2.0000 | Freq: Every day | NASAL | 16 refills | Status: DC

## 2018-08-08 NOTE — Patient Instructions (Addendum)
1. Seasonal and perennial allergic rhinitis (grasses, ragweed, trees, indoor molds, outdoor molds, dust mites, cat and dog) - We will not make any medication changes at this time.  - Continue taking: Zyrtec (cetirizine) 80m tablet once daily and Flonase (fluticasone) two sprays per nostril daily as needed - You can use an extra dose of the antihistamine, if needed, for breakthrough symptoms.  - Consider nasal saline rinses 1-2 times daily to remove allergens from the nasal cavities as well as help with mucous clearance (this is especially helpful to do before the nasal sprays are given) - Consider allergy shots as a means of long-term control.   2. 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 - Information on Xolair provided.  - Consider starting this to help prevent future reactions (this is a monthly medication).   3. Return in about 4 months (around 12/09/2018). This can be an in-person, a virtual Webex or a telephone follow up visit.    Please inform [place] any Emergency Department visits, hospitalizations, or changes in symptoms. Call [place] going to the ED for breathing or allergy symptoms since we might be able to fit you in for a sick visit. Feel free to contact [place] with any questions, problems, or concerns.  It was a pleasure to see you again today!  Websites that have reliable patient information: 1. American Academy of Asthma, Allergy, and Immunology: www.aaaai.org 2. Food Allergy Research and Education (FARE): foodallergy.org 3. Mothers of Asthmatics: http://www.asthmacommunitynetwork.org 4. American College of Allergy, Asthma, and Immunology: www.acaai.org  "Like" [facility] Facebook and Instagram for our latest updates!      Make sure you are registered to vote! If you have moved or changed any of your contact information, you will need to get this updated before voting!    Voter ID laws are NOT  going into effect for the General Election in November 2020! DO NOT let this stop you from exercising your right to vote!

## 2018-08-08 NOTE — Progress Notes (Signed)
FOLLOW UP  Date of Service/Encounter:  08/08/18   Assessment:   Anaphylaxis - unknown trigger  Seasonal and perennial allergic rhinitis (grasses, ragweed, trees, indoor molds, outdoor molds, dust mites, cat and dog)  Anxiety/depression - followed by Dr. Daron Offer   Chronic ulcerative colitis - followed by Dr. Rush Landmark   Plan/Recommendations:   1. Seasonal and perennial allergic rhinitis (grasses, ragweed, trees, indoor molds, outdoor molds, dust mites, cat and dog) - We will not make any medication changes at this time.  - Continue taking: Zyrtec (cetirizine) 28m tablet once daily and Flonase (fluticasone) two sprays per nostril daily as needed - You can use an extra dose of the antihistamine, if needed, for breakthrough symptoms.  - Consider nasal saline rinses 1-2 times daily to remove allergens from the nasal cavities as well as help with mucous clearance (this is especially helpful to do before the nasal sprays are given) - Consider allergy shots as a means of long-term control.   2. 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 - Information on Xolair provided.  - Consider starting this to help prevent future reactions (this is a monthly medication).   3. Return in about 4 months (around 12/09/2018). This can be an in-person, a virtual Webex or a telephone follow up visit.   Subjective:   Leslie KNESHA COUNIHANis a 53y.o. female presenting today for follow up of  Chief Complaint  Patient presents with  . Anaphylaxis    last one on 06/19/18, used epipen, [place] not [facility] a history of the following: Patient Active Problem List   Diagnosis Date Noted  . Anaphylaxis 05/14/2018  . Nausea and vomiting 05/13/2018  . Abnormal colonoscopy 04/04/2018  . Chronic diarrhea 04/04/2018  . Ulcerative pancolitis without complication ([facility] 078/[id number] . IBD (inflammatory bowel disease)  04/04/2018  . History of cholecystectomy 04/04/2018  . LUQ pain 04/04/2018  . Basal cell carcinoma 10/20/2017  . Anxiety with depression 10/20/2017  . Binge eating disorder 10/20/2017    History obtained from: chart review and patient.  Leslie Douglas is a 53y.o. female presenting for a follow up visit.  She was last seen in April 2020 via virtual visit.  At that time, she continued to have some episodes of anaphylaxis which we felt were related to stress.  She previously had an extensive lab work-up which was negative.  She does have multiple perennial and seasonal environmental allergens including grasses, ragweed, trees, indoor and outdoor molds, dust mite, cat, and dog.  She was also being worked up for ulcerative colitis by her gastroenterologist.  For her history of anaphylaxis, we did discuss starting Xolair as an immunomodulatory agent.  Since the last visit, she has mostly done well. She did have an episode in June. She reports that it started out with swelling of her mouth and tongue. She started having itching of her hands and feet combined with dizziness. She used an EpiPen once and her symptoms resolved within one hour. She works at [employer] she has been back at work since May 2020. This has been very stressful.   She did see Dr. ALulu Ridingher psychiatrist. She was changed to amitriptyline from Lexapro in May 2020. She thinks that this change helped somewhat but work is just too stressful. She has talked to her boss about these problems they have made minimal accommodations at work.  She is on the cetirizine 72m daily and the Flonase. She [facility] heart [facility] felt that it was not working any better than the Flonase at all.   She has an appointment with GI at the end of August. She was started on an enteric steroid apparently, but it did not work. She has a diagnosis of chronic ulcerative colitis.   Otherwise, there have been no changes to her past medical history,  surgical history, family history, or social history.    Review of Systems  Constitutional: Negative.  Negative for chills, fever, malaise/fatigue and weight loss.  HENT: Negative.  Negative for congestion, ear discharge, ear pain and sore throat.        Positive for rhinorrhea.   Eyes: Negative for pain, discharge and redness.  Respiratory: Negative for cough, sputum production, shortness of breath and wheezing.   Cardiovascular: Negative.  Negative for chest pain and palpitations.  Gastrointestinal: Negative for abdominal pain, heartburn, nausea and vomiting.  Skin: Negative.  Negative for itching and rash.  Neurological: Negative for dizziness and headaches.  Endo/Heme/Allergies: Negative for environmental allergies. Does not bruise/bleed easily.  Psychiatric/Behavioral: The patient is nervous/anxious.        Objective:   Blood pressure 102/82, pulse (!) 109, temperature 97.6 F (36.4 C), temperature source Temporal, resp. rate 16, height 5' 5.75" (1.67 m), weight 284 lb (128.8 kg), SpO2 96 %. Body mass index is 46.19 kg/m.   Physical Exam:  Physical Exam  Constitutional: She appears well-developed.  Obese female. Talkative but somewhat anxious.   HENT:  Head: Normocephalic and atraumatic.  Right Ear: Tympanic membrane, external ear and ear canal normal.  Left Ear: Tympanic membrane, external ear and ear canal normal.  Nose: Mucosal edema and rhinorrhea present. No nasal deformity or septal deviation. No epistaxis. Right sinus exhibits no maxillary sinus tenderness and no frontal sinus tenderness. Left sinus exhibits no maxillary sinus tenderness and no frontal sinus tenderness.  Mouth/Throat: Uvula is midline and oropharynx is clear and moist. Mucous membranes are not pale and not dry.  There is some mild cobblestoning present in the posterior oropharynx.   Eyes: Pupils are equal, round, and reactive to light. Conjunctivae and EOM are normal. Right eye exhibits no chemosis  and no discharge. Left eye exhibits no chemosis and no discharge. Right conjunctiva is not injected. Left conjunctiva is not injected.  Cardiovascular: Normal rate, regular rhythm and normal heart sounds.  Respiratory: Effort normal and breath sounds normal. No accessory muscle usage. No tachypnea. No respiratory distress. She has no wheezes. She has no rhonchi. She has no rales. She exhibits no tenderness.  Moving air well in all lung fields.   Lymphadenopathy:    She has no cervical adenopathy.  Neurological: She is alert.  Skin: No abrasion, no petechiae and no rash noted. Rash is not papular, not vesicular and not urticarial. No erythema. No pallor.  No eczematous or urticarial lesions noted.  Psychiatric: She has a normal mood and affect.     Diagnostic studies: none      JSalvatore Marvel MD  [facility]

## 2018-08-18 ENCOUNTER — Ambulatory Visit (INDEPENDENT_AMBULATORY_CARE_PROVIDER_SITE_OTHER): Payer: BC Managed Care – PPO | Admitting: Psychology

## 2018-08-18 DIAGNOSIS — F321 Major depressive disorder, single episode, moderate: Secondary | ICD-10-CM

## 2018-09-07 ENCOUNTER — Ambulatory Visit: Payer: BC Managed Care – PPO | Admitting: Gastroenterology

## 2018-09-07 ENCOUNTER — Encounter: Payer: Self-pay | Admitting: Gastroenterology

## 2018-09-07 ENCOUNTER — Other Ambulatory Visit (INDEPENDENT_AMBULATORY_CARE_PROVIDER_SITE_OTHER): Payer: BC Managed Care – PPO

## 2018-09-07 VITALS — BP 130/94 | HR 84 | Temp 98.2°F | Ht 65.5 in | Wt 281.0 lb

## 2018-09-07 DIAGNOSIS — K529 Noninfective gastroenteritis and colitis, unspecified: Secondary | ICD-10-CM

## 2018-09-07 DIAGNOSIS — K5100 Ulcerative (chronic) pancolitis without complications: Secondary | ICD-10-CM | POA: Diagnosis not present

## 2018-09-07 DIAGNOSIS — Z886 Allergy status to analgesic agent status: Secondary | ICD-10-CM

## 2018-09-07 DIAGNOSIS — Z888 Allergy status to other drugs, medicaments and biological substances status: Secondary | ICD-10-CM | POA: Diagnosis not present

## 2018-09-07 LAB — HEPATIC FUNCTION PANEL
ALT: 45 U/L — ABNORMAL HIGH (ref 0–35)
AST: 32 U/L (ref 0–37)
Albumin: 4.4 g/dL (ref 3.5–5.2)
Alkaline Phosphatase: 65 U/L (ref 39–117)
Bilirubin, Direct: 0.1 mg/dL (ref 0.0–0.3)
Total Bilirubin: 0.4 mg/dL (ref 0.2–1.2)
Total Protein: 7.7 g/dL (ref 6.0–8.3)

## 2018-09-07 LAB — BASIC METABOLIC PANEL
BUN: 13 mg/dL (ref 6–23)
CO2: 30 mEq/L (ref 19–32)
Calcium: 9.5 mg/dL (ref 8.4–10.5)
Chloride: 102 mEq/L (ref 96–112)
Creatinine, Ser: 0.88 mg/dL (ref 0.40–1.20)
GFR: 66.65 mL/min (ref >60.00)
Glucose, Bld: 85 mg/dL (ref 70–99)
Potassium: 4.1 mEq/L (ref 3.5–5.1)
Sodium: 139 mEq/L (ref 135–145)

## 2018-09-07 LAB — CBC
HCT: 37.8 % (ref 36.0–46.0)
Hemoglobin: 12.8 g/dL (ref 12.0–15.0)
MCHC: 33.8 g/dL (ref 30.0–36.0)
MCV: 90.9 fl (ref 78.0–100.0)
Platelets: 335 10*3/uL (ref 150.0–400.0)
RBC: 4.16 Mil/uL (ref 3.87–5.11)
RDW: 13.5 % (ref 11.5–15.5)
WBC: 6.6 10*3/uL (ref 4.0–10.5)

## 2018-09-07 LAB — HIGH SENSITIVITY CRP: CRP, High Sensitivity: 2.61 mg/L (ref 0.000–5.000)

## 2018-09-07 LAB — SEDIMENTATION RATE: Sed Rate: 21 mm/hr (ref 0–30)

## 2018-09-07 NOTE — Progress Notes (Signed)
[facility] VISIT   Primary Care Provider Inda Coke, [place] [address] [place] [address] [phone]  Patient Profile: Leslie Douglas is a 53 y.o. female with a pmh significant for hx of BCC of the skin, Anxiety/MDD, COPD, Unclear Anaphylactoid reactions, Chronic Diarrhea with now working diagnosis of UC.  The patient presents to the [facility] for an evaluation and management of problem(s) noted below:  Problem List 1. Ulcerative pancolitis without complication ([place])   2. Chronic diarrhea   3. History of allergy to aspirin     History of Present Illness: Please see initial consultation note and progress note for full details of HPI.  Interval History Patient returns for scheduled follow-up.  As per my chart messaging, the patient was able to try budesonide.  She used it for approximately 3-1/2 to 4 weeks.  Unfortunately, she developed steroid like Cushing's symptoms.  These included increased abdominal swelling lower extremity and upper extremity swelling.  She had increasing abdominal discomfort while being on the medication.  As such we elected to stop taking the medication and she alerted [name] to that fact.  After stopping her medication the majority of her symptoms occluding the abdominal upset improved somewhat to better baseline.  The patient has been dealing with significant amounts of stress and anxiety as a result of work as well as life situations.  She has had significant changes in her medication regimen to treat her mental health disorder.  She is now taking increased doses of amitriptyline and propranolol as well as the addition of Lamictal recently.  We went over her allergies once again.  Her aspirin allergy was as a child when she developed swelling and is no longer able to take aspirin or any other nonsteroidals because of that previous reaction but she has not tried on a course the last 50+ years I am not going  to make budesonide a allergic reaction medication at this point in time.   Sulfa medication gave her a rash and then she developed shortness of breath.  She just had a basal cell removed from her back.  IBD diary is as below: BMs per day -3-4 Nocturnal BMs -infrequently (occurred 2 times last month) Blood - None Mucous - Sometimes Tenesmus - Denies Urgency - 50% of the time Incomplete evaluation -20% Skin Manifestations -no new skin changes Eye Manifestations -no new vision change Joint Manifestations -unchanged joint discomfort  GI Review of Systems Positive as above including pyrosis infrequently Negative for nausea, vomiting, abdominal pain dysphagia, odynophagia, melena, bloating  Review of Systems General: Denies fevers/chills/weight loss HEENT: Denies oral lesions Cardiovascular: Denies chest pain Pulmonary: Denies shortness of breath Gastroenterological: See HPI Genitourinary: Denies darkened urine Hematological: Denies easy bruising Dermatological: As per HPI Psychological: Mood is stable Allergy & Immunology: Monitored through her allergist mostly Musculoskeletal: No new changes in chronic arthralgias   Medications Current Outpatient Medications  Medication Sig Dispense Refill   amitriptyline (ELAVIL) 150 MG tablet Take 1 tablet by mouth daily.     Biotin w/ Vitamins C & E (HAIR/SKIN/NAILS PO) Take by mouth.     cetirizine (ZYRTEC) 10 MG tablet Take 5 mg by mouth daily.     cholecalciferol (VITAMIN D3) 25 MCG (1000 UT) tablet Take 1,000 Units by mouth daily.     fluticasone (FLONASE) 50 MCG/ACT nasal spray Place 2 sprays into both nostrils daily. 32 g 16   lamoTRIgine (LAMICTAL) 25 MG tablet Take 2 tablets by mouth daily.  Multiple Vitamin (MULTIVITAMIN) tablet Take 1 tablet by mouth daily.     ondansetron (ZOFRAN ODT) 8 MG disintegrating tablet Take 1 tablet (8 mg total) by mouth every 8 (eight) hours as needed for nausea or vomiting. 20 tablet 2    propranolol (INDERAL) 10 MG tablet Take 1 tablet by mouth 3 (three) times daily as needed.     triamcinolone ointment (KENALOG) 0.5 % Apply twice daily for a week (Patient taking differently: Apply 1 application topically as needed. Apply twice daily for a week) 30 g 1   EPINEPHrine 0.3 mg/0.3 mL IJ SOAJ injection Inject 0.3 mLs (0.3 mg total) into the muscle as needed for anaphylaxis. (Patient not taking: Reported on 09/07/2018) 2 Device 2   No current facility-administered medications for this visit.     Allergies Allergies  Allergen Reactions   Doxycycline Rash and Swelling    Mouth and face   Erythromycin Shortness Of Breath   Sulfa Antibiotics Shortness Of Breath   Asa [Aspirin]    Penicillins Rash    Histories Past Medical History:  Diagnosis Date   Allergy    Anxiety    Binge eating disorder    Borderline personality disorder (HCC)    COPD (chronic obstructive pulmonary disease) (HCC)    chronic bronchitis   Depression    History of chicken pox    Pancolitis (HCC)    Skin cancer, basal cell    basal cell   Past Surgical History:  Procedure Laterality Date   CHOLECYSTECTOMY  2003   MOHS SURGERY  10/2017   VAGINAL DELIVERY  1995   WISDOM TOOTH EXTRACTION  12/2017   Social History   Socioeconomic History   Marital status: Legally Separated    Spouse name: Not on file   Number of children: 1   Years of education: Not on file   Highest education level: Not on file  Occupational History   Not on file  Social Needs   Financial resource strain: Not on file   Food insecurity    Worry: Not on file    Inability: Not on file   Transportation needs    Medical: Not on file    Non-medical: Not on file  Tobacco Use   Smoking status: Never Smoker   Smokeless tobacco: Never Used  Substance and Sexual Activity   Alcohol use: Yes    Frequency: Never    Comment: rarely   Drug use: Never   Sexual activity: Not Currently    Birth  control/protection: None  Lifestyle   Physical activity    Days per week: Not on file    Minutes per session: Not on file   Stress: Not on file  Relationships   Social connections    Talks on phone: Not on file    Gets together: Not on file    Attends religious service: Not on file    Active member of club or organization: Not on file    Attends meetings of clubs or organizations: Not on file    Relationship status: Not on file   Intimate partner violence    Fear of current or ex partner: Not on file    Emotionally abused: Not on file    Physically abused: Not on file    Forced sexual activity: Not on file  Other Topics Concern   Not on file  Social History Narrative   Divorced   Works for [employer], has Associate Degree   3 children  Family History  Problem Relation Age of Onset   Hypertension Mother    Colon cancer Neg Hx    Esophageal cancer Neg Hx    Rectal cancer Neg Hx    Stomach cancer Neg Hx    Pancreatic cancer Neg Hx    Inflammatory bowel disease Neg Hx    Liver disease Neg Hx    Allergic rhinitis Neg Hx    Angioedema Neg Hx    Asthma Neg Hx    Eczema Neg Hx    Urticaria Neg Hx    Immunodeficiency Neg Hx    I have reviewed her medical, social, and family history in detail and updated the electronic medical record as necessary.    PHYSICAL EXAMINATION  BP (!) 130/94 (BP Location: Left Wrist, Patient Position: Sitting, Cuff Size: Normal)    Pulse 84    Temp 98.2 F (36.8 C)    Ht 5' 5.5" (1.664 m) Comment: height measured without shoes   Wt 281 lb (127.5 kg)    BMI 46.05 kg/m  GEN: NAD, appears stated age, doesn't appear chronically ill PSYCH: Cooperative, without pressured speech EYE: Conjunctivae pink, sclerae anicteric ENT: MMM, without oral ulcers CV: RR without R/Gs  RESP: CTAB posteriorly, without wheezing GI: NABS, soft, obese, rounded, nontender without rebound or guarding, no HSM appreciated MSK/EXT: No lower  extremity edema SKIN: No jaundice, Band-Aids on upper back from recent skin cancer removal NEURO:  Alert & Oriented x 3, no focal deficits   REVIEW OF DATA  I reviewed the following data at the time of this encounter:  GI Procedures and Studies  Previously reviewed  Laboratory Studies  Reviewed in epic  Imaging Studies  No relevant studies to review   ASSESSMENT  Ms. Lassalle is a 53 y.o. female with a pmh significant for hx of BCC of the skin, Anxiety/MDD, COPD, Unclear Anaphylactoid reactions, Chronic Diarrhea with now working diagnosis of UC.  The patient is seen today for evaluation and management of:  1. Ulcerative pancolitis without complication ([place])   2. Chronic diarrhea   3. History of allergy to aspirin    The patient is hemodynamically stable and was clinically stable.  With that being said, she did not tolerate budesonide.  It is interesting that she has had some improvement in her bowel habits as a result of her mental health disorders being treated with medication adjustments.  Overall the frequency of her bowel movements has improved but still remains an issue.  I still think that she will benefit from therapy the question is what that is going to be.  I think it may be reasonable to have a conversation with her allergist to ask whether NSAID/aspirin allergy testing could be performed to see if she truly has an anaphylactic or reaction that would prevent [name] from considering the use of mesalamine products.  Because of her history of skin cancers, the immune modulators and Biologics mostly carry increased risks for the progression or recurrence of cancer of the skin.  Weyman Rodney and Stelara may be Biologics that could be considered to have decreased risk for skin cancers but no matter what immune modulators and Biologics may have increased risk for lymphoma as well and with her increasing age it is something that we need to keep in mind.  I did discuss briefly, the risks profiles of  immune modulators and Biologics.  The patient going to defer to [name] to what ever we think is best.  I would like to see  what her allergist thinks about allergy testing for aspirin/NSAIDs and a consideration of the use of mesalamine-like products.  From there we can decide whether she will need a biologic or not.  I will send repeat inflammatory markers to see if anything has changed from her prior negative inflammatory markers.  All patient questions were answered, to the best of my ability, and the patient agrees to the aforementioned plan of action with follow-up as indicated.   PLAN  Laboratories as outlined below Fecal calprotectin to be obtained Will reach out to patient's allergist to discuss aspirin/NSAID testing to see if she has true allergy or not to help [name] dictate potential topical therapy such as mesalamine products for patient on her risk benefit ratio Biologic therapy versus immunomodulator therapy to be considered Weyman Rodney [place] with the last skin cancer risk analysis Patient referred to Crohn's and colitis website and will review the possibility of biologic versus immunomodulator therapy   Orders Placed This Encounter  Procedures   Calprotectin, Fecal   CBC   Basic Metabolic Panel (BMET)   Hepatic function panel   Sedimentation rate   CRP High sensitivity    New Prescriptions   No medications on file   Modified Medications   No medications on file    Planned Follow Up: No follow-ups on file.   Justice Britain, MD [facility] Office # [phone]

## 2018-09-07 NOTE — Patient Instructions (Signed)
Your provider has requested that you go to the basement level for lab work before leaving today. Press "B" on the elevator. The lab is located at the first door on the left as you exit the elevator.  Please visit [facility] Web-site : Azathioprine, 6-mercaptopurine, Remicade,Humira   Thank you for choosing me and [facility].  Dr. Rush Landmark

## 2018-09-09 ENCOUNTER — Encounter: Payer: Self-pay | Admitting: Gastroenterology

## 2018-09-09 DIAGNOSIS — Z886 Allergy status to analgesic agent status: Secondary | ICD-10-CM | POA: Insufficient documentation

## 2018-09-09 DIAGNOSIS — Z888 Allergy status to other drugs, medicaments and biological substances status: Secondary | ICD-10-CM | POA: Insufficient documentation

## 2018-09-12 ENCOUNTER — Other Ambulatory Visit: Payer: BC Managed Care – PPO

## 2018-09-12 DIAGNOSIS — K5100 Ulcerative (chronic) pancolitis without complications: Secondary | ICD-10-CM

## 2018-09-15 ENCOUNTER — Other Ambulatory Visit: Payer: Self-pay

## 2018-09-15 DIAGNOSIS — R7989 Other specified abnormal findings of blood chemistry: Secondary | ICD-10-CM

## 2018-09-15 DIAGNOSIS — D509 Iron deficiency anemia, unspecified: Secondary | ICD-10-CM

## 2018-09-15 DIAGNOSIS — K529 Noninfective gastroenteritis and colitis, unspecified: Secondary | ICD-10-CM

## 2018-09-15 DIAGNOSIS — K5100 Ulcerative (chronic) pancolitis without complications: Secondary | ICD-10-CM

## 2018-09-15 DIAGNOSIS — R945 Abnormal results of liver function studies: Secondary | ICD-10-CM

## 2018-09-21 ENCOUNTER — Ambulatory Visit (INDEPENDENT_AMBULATORY_CARE_PROVIDER_SITE_OTHER): Payer: BC Managed Care – PPO | Admitting: Allergy & Immunology

## 2018-09-21 ENCOUNTER — Encounter: Payer: Self-pay | Admitting: Allergy & Immunology

## 2018-09-21 ENCOUNTER — Other Ambulatory Visit (INDEPENDENT_AMBULATORY_CARE_PROVIDER_SITE_OTHER): Payer: BC Managed Care – PPO

## 2018-09-21 DIAGNOSIS — R945 Abnormal results of liver function studies: Secondary | ICD-10-CM | POA: Diagnosis not present

## 2018-09-21 DIAGNOSIS — Z888 Allergy status to other drugs, medicaments and biological substances status: Secondary | ICD-10-CM | POA: Diagnosis not present

## 2018-09-21 DIAGNOSIS — K529 Noninfective gastroenteritis and colitis, unspecified: Secondary | ICD-10-CM

## 2018-09-21 DIAGNOSIS — D509 Iron deficiency anemia, unspecified: Secondary | ICD-10-CM | POA: Diagnosis not present

## 2018-09-21 DIAGNOSIS — K5100 Ulcerative (chronic) pancolitis without complications: Secondary | ICD-10-CM | POA: Diagnosis not present

## 2018-09-21 DIAGNOSIS — R7989 Other specified abnormal findings of blood chemistry: Secondary | ICD-10-CM

## 2018-09-21 LAB — GAMMA GT: GGT: 24 U/L (ref 7–51)

## 2018-09-21 LAB — FERRITIN: Ferritin: 90.3 ng/mL (ref 10.0–291.0)

## 2018-09-21 LAB — CK: Total CK: 56 U/L (ref 7–177)

## 2018-09-21 LAB — CALPROTECTIN, FECAL: Calprotectin, Fecal: 160 ug/g — ABNORMAL HIGH (ref 0–120)

## 2018-09-21 NOTE — Patient Instructions (Addendum)
1. Aspirin containing medica

## 2018-09-21 NOTE — Progress Notes (Signed)
RE: Leslie Douglas MRN: [id number] DOB: 03-Oct-1965 Date of Telemedicine Visit: 09/21/2018  Referring provider: Inda Coke, PA Primary care provider: Inda Coke, PA  Chief Complaint: No chief complaint on file.   Telemedicine Follow Up Visit via Telephone: I connected with Leslie Douglas for a follow up on 09/21/18 by telephone and verified that I am speaking with the correct person using two identifiers.   I discussed the limitations, risks, security and privacy concerns of performing an evaluation and management service by telephone and the availability of in person appointments. I also discussed with the patient that there may be a patient responsible charge related to this service. The patient expressed understanding and agreed to proceed.  Patient is at work.  Provider is at the office.  Visit start time: 1:34 PM Visit end time: 1:47 PM Insurance consent/check in by: Leslie Douglas consent and medical assistant/nurse: Leslie Douglas  History of Present Illness:  She is a 53 y.o. 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 Her previous allergy office visit was in July 2020 with myself.  At that time, we did not make any medication changes.  We continued with Zyrtec and Flonase as needed.  For anaphylactic reactions, we have done a thorough work-up and this was all normal.  She was having around 4 occurrences per year.  We did discuss Xolair as a means of an immunomodulatory agent, but did not feel it was warranted given the fact that she only has 3-4 episodes per year.  In the interim, her gastroenterologist wanted to try mesalamine.  Therefore, he reached out to me to ask if we can address her aspirin allergy. Budesonide was "awful" and she was made "crazy".  Her gastroenterologist did not feel that a biologic was indicated at this point.  Regarding her aspirin allergy, she had one episode of anaphylaxis in the  third grade. She had taken Leslie Douglas and Leslie Douglas baby aspirin. She developed swelling of her bottom lip. She went to the ED and received a shot in the ED. She had tolerated aspirin before that. Since that time, she has avoided all aspirin containing products.  She did have Pepto-Bismol within the last ten years. She reports that she "got sicker". She was having a GI issue at the time and these symptoms worsened. She took [employer] only once. She did take that when she was little as well. She did tolerate it after the third grade but then did not have it for years and years.   She has had Alka Seltzer in the past, at least in high school or so, without issues. She had this multiple times, even after the episodes in 3rd grade.   She did have tendonitis within her feet at one point, maybe around 2008. She was prescribed meloxicam which she seemed to tolerate for the most part.   Otherwise, there have been no changes to her past medical history, surgical history, family history, or social history.  Assessment and Plan:  Leslie Douglas is a 53 y.o. female with:  Anaphylaxis - unknown trigger  Seasonal and perennial allergic rhinitis (grasses, ragweed, trees, indoor molds, outdoor molds, dust mites, cat and dog)  Anxiety/depression - followed by Leslie Douglas   Chronic ulcerative colitis - followed by Leslie Douglas   Aspirin allergy label    Leslie Douglas presents to discuss her aspirin allergy.  It seems that she was first diagnosed when she was in third grade due to an isolated  episode of lip swelling.  However, in the subsequent years, she did tolerate aspirin-containing products including Alka-Seltzer as well as Pepto-Bismol without any similar reactions.  Given this, I do think she would be able to tolerate aspirin in the office setting.  We will do a drug challenge to aspirin in 2 weeks so that we can remove the aspirin label.  This would allow her gastroenterologist to start an aspirin containing  medication to control her inflammatory bowel disease.  Diagnostics: None.  Medication List:  Current Outpatient Medications  Medication Sig Dispense Refill  . amitriptyline (ELAVIL) 150 MG tablet Take 1 tablet by mouth daily.    . Biotin w/ Vitamins C & E (HAIR/SKIN/NAILS PO) Take by mouth.    . cetirizine (ZYRTEC) 10 MG tablet Take 5 mg by mouth daily.    . cholecalciferol (VITAMIN D3) 25 MCG (1000 UT) tablet Take 1,000 Units by mouth daily.    Marland Kitchen EPINEPHrine 0.3 mg/0.3 mL IJ SOAJ injection Inject 0.3 mLs (0.3 mg total) into the muscle as needed for anaphylaxis. (Patient not taking: Reported on 09/07/2018) 2 Device 2  . fluticasone (FLONASE) 50 MCG/ACT nasal spray Place 2 sprays into both nostrils daily. 32 g 16  . lamoTRIgine (LAMICTAL) 25 MG tablet Take 2 tablets by mouth daily.    . Multiple Vitamin (MULTIVITAMIN) tablet Take 1 tablet by mouth daily.    . ondansetron (ZOFRAN ODT) 8 MG disintegrating tablet Take 1 tablet (8 mg total) by mouth every 8 (eight) hours as needed for nausea or vomiting. 20 tablet 2  . propranolol (INDERAL) 10 MG tablet Take 1 tablet by mouth 3 (three) times daily as needed.    . triamcinolone ointment (KENALOG) 0.5 % Apply twice daily for a week (Patient taking differently: Apply 1 application topically as needed. Apply twice daily for a week) 30 g 1   No current facility-administered medications for this visit.    Allergies: Allergies  Allergen Reactions  . Doxycycline Rash and Swelling    Mouth and face  . Erythromycin Shortness Of Breath  . Sulfa Antibiotics Shortness Of Breath  . Asa [Aspirin]   . Penicillins Rash   I reviewed her past medical history, social history, family history, and environmental history and no significant changes have been reported from previous visits.  Review of Systems  Constitutional: Negative for activity change, appetite change, chills, fatigue and fever.  HENT: Negative for congestion, postnasal drip, rhinorrhea, sinus  pressure and sore throat.   Eyes: Negative for pain, discharge, redness and itching.  Respiratory: Negative for shortness of breath, wheezing and stridor.   Gastrointestinal: Negative for diarrhea, nausea and vomiting.  Endocrine: Negative for cold intolerance.  Musculoskeletal: Negative for arthralgias, joint swelling and myalgias.  Skin: Negative for rash.  Allergic/Immunologic: Negative for environmental allergies, food allergies and immunocompromised state.    Objective:  Physical exam not obtained as encounter was done via telephone.   Previous notes and tests were reviewed.  I discussed the assessment and treatment plan with the patient. The patient was provided an opportunity to ask questions and all were answered. The patient agreed with the plan and demonstrated an understanding of the instructions.   The patient was advised to call back or seek an in-person evaluation if the symptoms worsen or if the condition fails to improve as anticipated.  I provided 13 minutes of non-face-to-face time during this encounter.  It was my pleasure to participate in [place] care today. Please feel free to contact me  with any questions or concerns.   Sincerely,  Valentina Shaggy, MD

## 2018-09-26 LAB — ANTI-SMOOTH MUSCLE ANTIBODY, IGG: Actin (Smooth Muscle) Antibody (IGG): 21 U — ABNORMAL HIGH (ref <20)

## 2018-09-26 LAB — HEPATITIS B CORE ANTIBODY, TOTAL: Hep B Core Total Ab: NONREACTIVE

## 2018-09-26 LAB — IGG, IGA, IGM
IgG (Immunoglobin G), Serum: 1320 mg/dL (ref 600–1640)
IgM, Serum: 197 mg/dL (ref 50–300)
Immunoglobulin A: 157 mg/dL (ref 47–310)

## 2018-09-26 LAB — MITOCHONDRIAL ANTIBODIES: Mitochondrial M2 Ab, IgG: <=20 U

## 2018-09-26 LAB — ALPHA-1-ANTITRYPSIN: A-1 Antitrypsin, Ser: 117 mg/dL (ref 83–199)

## 2018-09-26 LAB — IRON, TOTAL/TOTAL IRON BINDING CAP
%SAT: 26 % (calc) (ref 16–45)
Iron: 89 ug/dL (ref 45–160)
TIBC: 346 mcg/dL (calc) (ref 250–450)

## 2018-09-26 LAB — IGE: IgE (Immunoglobulin E), Serum: 217 kU/L — ABNORMAL HIGH (ref <=114)

## 2018-09-26 LAB — CERULOPLASMIN: Ceruloplasmin: 32 mg/dL (ref 18–53)

## 2018-10-03 ENCOUNTER — Encounter: Payer: Self-pay | Admitting: Allergy & Immunology

## 2018-10-03 ENCOUNTER — Ambulatory Visit: Payer: BC Managed Care – PPO | Admitting: Allergy & Immunology

## 2018-10-03 ENCOUNTER — Other Ambulatory Visit: Payer: Self-pay

## 2018-10-03 VITALS — BP 140/78 | HR 94 | Temp 97.8°F | Resp 18

## 2018-10-03 DIAGNOSIS — Z888 Allergy status to other drugs, medicaments and biological substances status: Secondary | ICD-10-CM

## 2018-10-03 NOTE — Progress Notes (Signed)
FOLLOW UP  Date of Service/Encounter:  10/03/18   Assessment:   Anaphylaxis- unknown trigger  Seasonal and perennial allergic rhinitis(grasses, ragweed, trees, indoor molds, outdoor molds, dust mites, cat and dog)  Anxiety/depression - followed by Dr. Daron Offer   Chronic ulcerative colitis - followed by Dr.Mansouraty  Aspirin allergy - passed challenge today   Ms. Schriner presents for an aspirin challenge.  She did receive a full dose on the end of the visit today and tolerated this without adverse event.  This does rule out IgE mediated reactions to aspirin, although there can be some late presentations of T-cell mediated reactions.  Fortunately, the majority of T-cell mediated reactions are not life-threatening.  She is going to call us in the next 24 to 48 hours with an update.  However, I am tentatively optimistic that she will be able to tolerate aspirin-containing medications for control of her chronic ulcerative colitis.   Plan/Recommendations:   1. Aspirin allergy - You tolerated the aspirin challenge today. - Call us over the next 24-48 hours if there are any problem. - I will let Dr.Mansouraty know that you passed.   2. Return in about 4 months (around 02/02/2019). This can be an in-person, a virtual Webex or a telephone follow up visit.  Subjective:   Leslie Douglas is a 53 y.o. female presenting today for follow up of  Chief Complaint  Patient presents with  . Food/Drug Challenge    aspirin    Leslie Douglas has a history of the following: Patient Active Problem List   Diagnosis Date Noted  . History of allergy to aspirin 09/09/2018  . Anaphylaxis 05/14/2018  . Nausea and vomiting 05/13/2018  . Abnormal colonoscopy 04/04/2018  . Chronic diarrhea 04/04/2018  . Ulcerative pancolitis without complication ([place]) 16/10/9602  . IBD (inflammatory bowel disease) 04/04/2018  . History of cholecystectomy 04/04/2018  . LUQ pain 04/04/2018  . Basal cell  carcinoma 10/20/2017  . Anxiety with depression 10/20/2017  . Binge eating disorder 10/20/2017    History obtained from: chart review and patient.  Leslie Douglas is a 52 y.o. female presenting for a drug challenge (aspirin).  She was last seen as a virtual patient in September 2020 earlier this month.  At that time, we discussed her aspirin allergy.  Evidently, she reacted when she was in third grade with swelling of her bottom lip.  She had gotten into her sister's aspirin which tasted like an orange dream cycle apparently.  Regardless, she went to the ER and got an injection of some sort.  However, she has tolerated Pepto-Bismol with mild reactions and she has been on Alka-Seltzer multiple times in the past as well.  She has a history of chronic ulcerative colitis and her GI doctor, Dr. Rush Landmark, wants to use an ASA derived treatment instead of the Enterocort.   Since we last talked, she has done well.  She reports feeling very good today without any fever or viral symptoms.  She has had no antihistamines.  She has had no recent episodes of anaphylaxis.  In the past, we have done a very large work-up that has been negative.  Otherwise, there have been no changes to her past medical history, surgical history, family history, or social history.    Review of Systems  Constitutional: Negative.  Negative for chills, fever, malaise/fatigue and weight loss.  HENT: Negative.  Negative for congestion, ear discharge and ear pain.   Eyes: Negative for pain, discharge and redness.  Respiratory: Negative  for cough, sputum production, shortness of breath and wheezing.   Cardiovascular: Negative.  Negative for chest pain and palpitations.  Gastrointestinal: Negative for abdominal pain, heartburn, nausea and vomiting.  Skin: Negative.  Negative for itching and rash.  Neurological: Negative for dizziness and headaches.  Endo/Heme/Allergies: Negative for environmental allergies. Does not bruise/bleed easily.        Objective:   Blood pressure 140/78, pulse 94, temperature 97.8 F (36.6 C), temperature source Temporal, resp. rate 18, SpO2 98 %. There is no height or weight on file to calculate BMI.   Physical Exam: deferred since this was an aspirin challenge only  Open graded aspirin oral challenge: The patient was able to tolerate the challenge today without adverse signs or symptoms. Vital signs were stable throughout the challenge and observation period. She received multiple doses separated by 30 minutes, each of which was separated by vitals and a brief physical exam. She received the following doses: 81 mg (25%) and 244 mg (75%). She was monitored for 60 minutes following the last dose.   Time of first dose: 13:30  Time of discharge: 15:45       Salvatore Marvel, MD  [facility] [place] [facility]

## 2018-10-03 NOTE — Patient Instructions (Addendum)
1. Aspirin allergy - You tolerated the aspirin challenge today. - Call us over the next 24-48 hours if there are any problem. - I will let Dr.Mansouraty know that you passed.   2. Return in about 4 months (around 02/02/2019). This can be an in-person, a virtual Webex or a telephone follow up visit.   Please inform us of any Emergency Department visits, hospitalizations, or changes in symptoms. Call us before going to the ED for breathing or allergy symptoms since we might be able to fit you in for a sick visit. Feel free to contact us anytime with any questions, problems, or concerns.  It was a pleasure to see you again today!  Websites that have reliable patient information: 1. American Academy of Asthma, Allergy, and Immunology: www.aaaai.org 2. Food Allergy Research and Education (FARE): foodallergy.org 3. Mothers of Asthmatics: http://www.asthmacommunitynetwork.org 4. American College of Allergy, Asthma, and Immunology: www.acaai.org  "Like" [name] on Facebook and Instagram for our latest updates!      Make sure you are registered to vote! If you have moved or changed any of your contact information, you will need to get this updated before voting!  In some cases, you MAY be able to register to vote online: [email or web address]    Voter ID laws are NOT going into effect for the General Election in November 2020! DO NOT let this stop you from exercising your right to vote!   Absentee voting is the SAFEST way to vote during the coronavirus pandemic!   Download and print an absentee ballot request form at [email or web address]-[email or web address]-Request or you can scan the QR code below with your smart phone:      More information on absentee ballots can be found here: [email or web address]-[email or web address]  [place] VOTING SITES have been established! You can register to vote and cast your vote on the same day at these locations. See this site for more information on  what you need to register to vote: [email or web address].[email or web address]

## 2018-10-04 ENCOUNTER — Other Ambulatory Visit: Payer: Self-pay

## 2018-10-04 DIAGNOSIS — R760 Raised antibody titer: Secondary | ICD-10-CM

## 2018-10-09 NOTE — Progress Notes (Signed)
Can someone reach out to see how Ms. Valencia did after the challenge? I want to make sure that she did not have any late onset reactions.   Thanks, Salvatore Marvel, MD [facility] [place]

## 2018-10-09 NOTE — Progress Notes (Signed)
Called patient and she said that she is doing great and has not had any issues.

## 2018-10-12 NOTE — Progress Notes (Signed)
This is great. I have removed this from her allergy list and let Dr. Lenard Simmer the outcome.    Salvatore Marvel, MD [facility] [place]

## 2018-10-13 ENCOUNTER — Ambulatory Visit: Payer: BC Managed Care – PPO | Admitting: Gastroenterology

## 2018-10-13 ENCOUNTER — Other Ambulatory Visit (INDEPENDENT_AMBULATORY_CARE_PROVIDER_SITE_OTHER): Payer: BC Managed Care – PPO

## 2018-10-13 ENCOUNTER — Encounter: Payer: Self-pay | Admitting: Gastroenterology

## 2018-10-13 VITALS — BP 124/76 | HR 99 | Temp 98.6°F | Ht 66.0 in | Wt 281.0 lb

## 2018-10-13 DIAGNOSIS — R7989 Other specified abnormal findings of blood chemistry: Secondary | ICD-10-CM

## 2018-10-13 DIAGNOSIS — R760 Raised antibody titer: Secondary | ICD-10-CM

## 2018-10-13 DIAGNOSIS — K529 Noninfective gastroenteritis and colitis, unspecified: Secondary | ICD-10-CM

## 2018-10-13 DIAGNOSIS — R945 Abnormal results of liver function studies: Secondary | ICD-10-CM

## 2018-10-13 LAB — COMPREHENSIVE METABOLIC PANEL
ALT: 46 U/L — ABNORMAL HIGH (ref 0–35)
AST: 33 U/L (ref 0–37)
Albumin: 4.2 g/dL (ref 3.5–5.2)
Alkaline Phosphatase: 62 U/L (ref 39–117)
BUN: 15 mg/dL (ref 6–23)
CO2: 29 mEq/L (ref 19–32)
Calcium: 9.6 mg/dL (ref 8.4–10.5)
Chloride: 103 mEq/L (ref 96–112)
Creatinine, Ser: 0.87 mg/dL (ref 0.40–1.20)
GFR: 67.51 mL/min (ref >60.00)
Glucose, Bld: 94 mg/dL (ref 70–99)
Potassium: 3.7 mEq/L (ref 3.5–5.1)
Sodium: 140 mEq/L (ref 135–145)
Total Bilirubin: 0.4 mg/dL (ref 0.2–1.2)
Total Protein: 7.4 g/dL (ref 6.0–8.3)

## 2018-10-13 MED ORDER — MESALAMINE 1.2 G PO TBEC: 4.8000 g | DELAYED_RELEASE_TABLET | Freq: Every day | ORAL | 3 refills | Status: DC

## 2018-10-13 NOTE — Patient Instructions (Addendum)
If you are age 55 or younger, your body mass index should be between 19-25. Your Body mass index is 45.35 kg/m. If this is out of the aformentioned range listed, please consider follow up with your Primary Care Provider.   We have sent the following medications to your pharmacy for you to pick up at your convenience: [facility] provider has requested that you go to the basement level for lab work before leaving today. Press "B" on the elevator. The lab is located at the first door on the left as you exit the elevator.  Thank you for choosing me and [facility].  Dr. Rush Landmark

## 2018-10-14 ENCOUNTER — Encounter: Payer: Self-pay | Admitting: Gastroenterology

## 2018-10-14 DIAGNOSIS — R7989 Other specified abnormal findings of blood chemistry: Secondary | ICD-10-CM | POA: Insufficient documentation

## 2018-10-14 DIAGNOSIS — R945 Abnormal results of liver function studies: Secondary | ICD-10-CM | POA: Insufficient documentation

## 2018-10-14 NOTE — Progress Notes (Signed)
[facility] VISIT   Primary Care Provider Inda Coke, [place] [address] [place] [address] [phone]  Patient Profile: Leslie Douglas is a 53 y.o. female with a pmh significant for hx of BCC of the skin, Anxiety/MDD, COPD, Unclear Anaphylactoid reactions, Chronic Diarrhea with now working diagnosis of UC.  The patient presents to the [facility] for an evaluation and management of problem(s) noted below:  Problem List 1. IBD (inflammatory bowel disease)   2. Abnormal LFTs   3. Positive Anti-Smooth Muscle antibody      History of Present Illness: Please see initial consultation note and progress notes for full details of HPI.   Interval History Since our last visit I was able to discuss her case with her Allergy/Immunologist Dr. Ernst Bowler.  She subsequently has undergone an aspirin tolerance testing and CPAP and her aspirin allergy has now been removed.  From a GI perspective-his dramatically changed as per her IBD diary now.  She has been taking some nonsteroidals, Aleve (once daily) to help with her arthralgias due to her recent move from [place] to [place].  It has not changed her bowel habits.  Her laboratories returned showing elevation in her fecal calprotectin.  She has a slight persistent elevation in ALT.  IBD diary is as below: BMs per day -3-4 Nocturnal BMs -infrequently Blood - None Mucous - Sometimes Tenesmus - Denies Urgency - 50% of the time Incomplete evaluation -20% Skin Manifestations -no new skin changes Eye Manifestations -no new vision change Joint Manifestations -unchanged joint discomfort  GI Review of Systems Positive as above Negative for nausea, vomiting, abdominal pain dysphagia, odynophagia, melena, bloating  Review of Systems General: Denies fevers/chills HEENT: Denies oral lesions Cardiovascular: Denies chest pain Pulmonary: Denies shortness of breath Gastroenterological:  See HPI Genitourinary: Denies darkened urine Hematological: Denies easy bruising Dermatological: As per HPI Psychological: Mood is stable Allergy & Immunology: Continued monitoring with her allergist Musculoskeletal: Recent arthralgias due to moving from [place] to [facility]   Medications Current Outpatient Medications  Medication Sig Dispense Refill  . amitriptyline (ELAVIL) 150 MG tablet Take 1 tablet by mouth daily.    . Biotin w/ Vitamins C & E (HAIR/SKIN/NAILS PO) Take by mouth.    . cetirizine (ZYRTEC) 10 MG tablet Take 5 mg by mouth daily.    . cholecalciferol (VITAMIN D3) 25 MCG (1000 UT) tablet Take 1,000 Units by mouth daily.    Marland Kitchen EPINEPHrine 0.3 mg/0.3 mL IJ SOAJ injection Inject 0.3 mLs (0.3 mg total) into the muscle as needed for anaphylaxis. 2 Device 2  . fluticasone (FLONASE) 50 MCG/ACT nasal spray Place 2 sprays into both nostrils daily. 32 g 16  . lamoTRIgine (LAMICTAL) 100 MG tablet Take 100 mg by mouth daily.    . Multiple Vitamin (MULTIVITAMIN) tablet Take 1 tablet by mouth daily.    . ondansetron (ZOFRAN ODT) 8 MG disintegrating tablet Take 1 tablet (8 mg total) by mouth every 8 (eight) hours as needed for nausea or vomiting. 20 tablet 2  . propranolol (INDERAL) 10 MG tablet Take 1 tablet by mouth 3 (three) times daily as needed.    . triamcinolone ointment (KENALOG) 0.5 % Apply twice daily for a week (Patient taking differently: Apply 1 application topically as needed. Apply twice daily for a week) 30 g 1  . mesalamine (LIALDA) 1.2 g EC tablet Take 4 tablets (4.8 g total) by mouth daily with breakfast. 60 tablet 3   No current facility-administered medications for this  visit.     Allergies Allergies  Allergen Reactions  . Doxycycline Rash and Swelling    Mouth and face  . Erythromycin Shortness Of Breath  . Sulfa Antibiotics Shortness Of Breath  . Penicillins Rash    Histories Past Medical History:  Diagnosis Date  . Allergy   . Anxiety   . Binge  eating disorder   . Borderline personality disorder ([place])   . COPD (chronic obstructive pulmonary disease) (HCC)    chronic bronchitis  . Depression   . History of chicken pox   . Pancolitis ([place])   . Skin cancer, basal cell    basal cell   Past Surgical History:  Procedure Laterality Date  . CHOLECYSTECTOMY  2003  . MOHS SURGERY  10/2017  . VAGINAL DELIVERY  1995  . WISDOM TOOTH EXTRACTION  12/2017   Social History   Socioeconomic History  . Marital status: Legally Separated    Spouse name: Not on file  . Number of children: 3  . Years of education: Not on file  . Highest education level: Not on file  Occupational History  . Occupation: Aeronautical engineer    Comment: [employer]  Social Needs  . Financial resource strain: Not on file  . Food insecurity    Worry: Not on file    Inability: Not on file  . Transportation needs    Medical: Not on file    Non-medical: Not on file  Tobacco Use  . Smoking status: Never Smoker  . Smokeless tobacco: Never Used  Substance and Sexual Activity  . Alcohol use: Yes    Frequency: Never    Comment: rarely  . Drug use: Never  . Sexual activity: Not Currently    Birth control/protection: None  Lifestyle  . Physical activity    Days per week: Not on file    Minutes per session: Not on file  . Stress: Not on file  Relationships  . Social Herbalist on phone: Not on file    Gets together: Not on file    Attends religious service: Not on file    Active member of club or organization: Not on file    Attends meetings of clubs or organizations: Not on file    Relationship status: Not on file  . Intimate partner violence    Fear of current or ex partner: Not on file    Emotionally abused: Not on file    Physically abused: Not on file    Forced sexual activity: Not on file  Other Topics Concern  . Not on file  Social History Narrative   Divorced   Works for [employer], has Associate Degree   3 children    Family History  Problem Relation Age of Onset  . Hypertension Mother   . Colon cancer Neg Hx   . Esophageal cancer Neg Hx   . Rectal cancer Neg Hx   . Stomach cancer Neg Hx   . Pancreatic cancer Neg Hx   . Inflammatory bowel disease Neg Hx   . Liver disease Neg Hx   . Allergic rhinitis Neg Hx   . Angioedema Neg Hx   . Asthma Neg Hx   . Eczema Neg Hx   . Urticaria Neg Hx   . Immunodeficiency Neg Hx    I have reviewed her medical, social, and family history in detail and updated the electronic medical record as necessary.    PHYSICAL EXAMINATION  BP 124/76   Pulse  99   Temp 98.6 F (37 C)   Ht _0  (1.676 m)   Wt 281 lb (127.5 kg)   BMI 45.35 kg/m  GEN: NAD, appears stated age, doesn't appear chronically ill PSYCH: Cooperative, without pressured speech EYE: Conjunctivae pink, sclerae anicteric ENT: MMM CV: RR without R/Gs  RESP: CTAB posteriorly, without wheezing GI: NABS, soft, obese, rounded, nontender without rebound or guarding MSK/EXT: No lower extremity edema SKIN: No jaundice NEURO:  Alert & Oriented x 3, no focal deficits   REVIEW OF DATA  I reviewed the following data at the time of this encounter:  GI Procedures and Studies  Previously reviewed  Laboratory Studies  Reviewed in epic  Imaging Studies  No relevant studies to review   ASSESSMENT  Ms. Mcdade is a 53 y.o. female with a pmh significant for hx of BCC of the skin, Anxiety/MDD, COPD, Unclear Anaphylactoid reactions, Chronic Diarrhea with now working diagnosis of UC.  The patient is seen today for evaluation and management of:  1. IBD (inflammatory bowel disease)   2. Abnormal LFTs   3. Positive Anti-Smooth Muscle antibody     Overall, the patient is grossly unchanged since her last visit.  We now have be ability to trial mesalamine products due to her aspirin allergy having been removed after tolerance testing.  I am going to initiate her on Lialda 4.8 g but for the first couple of weeks  to start at 2.4 g to see how she does.  She was instructed that Lialda can cause at times a worsening in symptoms as a result of the medication before it improves and so it is something that she will be on the look out for and continue her stool diary.  Hopefully we can maintain her without needing to go to biologic therapy due to her history of BCC and her young age.  I would probably consider repeat colonoscopy evaluation to ensure changes are still present before we go down the road biologic therapy because of the aforementioned concerns.  We will see how she does but hopefully Lialda or an ASA product will be helpful for her.  Regards to her elevated ALT I still believe that this is most likely fatty liver however she had a slightly positive anti-smooth muscle antibody at 21 (upper limit of negative is 20).  We discussed that if her liver tests remain an issue over the course of the next 6 months then we would likely need to consider a liver biopsy or if they rise dramatically to ensure she does not have an underlying autoimmune hepatitis.  Steroids have already been difficult in regards to budesonide for her so we will have to see what we would have to do if she did really have autoimmune hepatitis (I do not believe she currently does).  All patient questions were answered, to the best of my ability, and the patient agrees to the aforementioned plan of action with follow-up as indicated.   PLAN  Laboratories as outlined below We will recheck creatinine in 2 to 3 weeks after Lialda has been initiated Lialda 4.8 g daily (for first 2 weeks do 2.4 g daily) Continue diarrhea diary Patient to reach out to [name] based on how she is doing and follow-up in approximately 5 weeks Hopeful to hold on biologic therapy or immune modulator therapy (Entyvio or Stelara may need to be considered)   Orders Placed This Encounter  Procedures  . Comp Met (CMET)    New Prescriptions  MESALAMINE (LIALDA) 1.2 G EC TABLET     Take 4 tablets (4.8 g total) by mouth daily with breakfast.   Modified Medications   No medications on file    Planned Follow Up: No follow-ups on file.   Justice Britain, MD [facility] Office # [phone]

## 2018-11-06 ENCOUNTER — Encounter: Payer: Self-pay | Admitting: Physician Assistant

## 2018-11-13 ENCOUNTER — Other Ambulatory Visit: Payer: Self-pay

## 2018-11-13 ENCOUNTER — Ambulatory Visit: Payer: Self-pay | Admitting: *Deleted

## 2018-11-13 ENCOUNTER — Encounter (HOSPITAL_COMMUNITY): Payer: Self-pay | Admitting: *Deleted

## 2018-11-13 ENCOUNTER — Emergency Department (HOSPITAL_COMMUNITY)
Admission: EM | Admit: 2018-11-13 | Discharge: 2018-11-13 | Disposition: A | Discharge status: Home / Self Care | Payer: BC Managed Care – PPO | Attending: Emergency Medicine | Admitting: Emergency Medicine

## 2018-11-13 DIAGNOSIS — Z79899 Other long term (current) drug therapy: Secondary | ICD-10-CM | POA: Diagnosis not present

## 2018-11-13 DIAGNOSIS — H9319 Tinnitus, unspecified ear: Secondary | ICD-10-CM | POA: Diagnosis not present

## 2018-11-13 DIAGNOSIS — R002 Palpitations: Secondary | ICD-10-CM | POA: Diagnosis not present

## 2018-11-13 DIAGNOSIS — J449 Chronic obstructive pulmonary disease, unspecified: Secondary | ICD-10-CM | POA: Insufficient documentation

## 2018-11-13 DIAGNOSIS — R208 Other disturbances of skin sensation: Secondary | ICD-10-CM | POA: Insufficient documentation

## 2018-11-13 DIAGNOSIS — Z85828 Personal history of other malignant neoplasm of skin: Secondary | ICD-10-CM | POA: Diagnosis not present

## 2018-11-13 DIAGNOSIS — E876 Hypokalemia: Secondary | ICD-10-CM | POA: Diagnosis not present

## 2018-11-13 DIAGNOSIS — R42 Dizziness and giddiness: Secondary | ICD-10-CM | POA: Insufficient documentation

## 2018-11-13 DIAGNOSIS — I10 Essential (primary) hypertension: Secondary | ICD-10-CM | POA: Diagnosis not present

## 2018-11-13 LAB — BASIC METABOLIC PANEL
Anion gap: 10 (ref 5–15)
BUN: 18 mg/dL (ref 6–20)
CO2: 26 mmol/L (ref 22–32)
Calcium: 8.9 mg/dL (ref 8.9–10.3)
Chloride: 102 mmol/L (ref 98–111)
Creatinine, Ser: 0.85 mg/dL (ref 0.44–1.00)
GFR calc Af Amer: >60 mL/min (ref >60)
GFR calc non Af Amer: >60 mL/min (ref >60)
Glucose, Bld: 83 mg/dL (ref 70–99)
Potassium: 3.4 mmol/L — ABNORMAL LOW (ref 3.5–5.1)
Sodium: 138 mmol/L (ref 135–145)

## 2018-11-13 LAB — CBC WITH DIFFERENTIAL/PLATELET
Abs Immature Granulocytes: 0.03 10*3/uL (ref 0.00–0.07)
Basophils Absolute: 0.1 10*3/uL (ref 0.0–0.1)
Basophils Relative: 0 %
Eosinophils Absolute: 0.1 10*3/uL (ref 0.0–0.5)
Eosinophils Relative: 1 %
HCT: 41.6 % (ref 36.0–46.0)
Hemoglobin: 13.5 g/dL (ref 12.0–15.0)
Immature Granulocytes: 0 %
Lymphocytes Relative: 37 %
Lymphs Abs: 4.2 10*3/uL — ABNORMAL HIGH (ref 0.7–4.0)
MCH: 30.6 pg (ref 26.0–34.0)
MCHC: 32.5 g/dL (ref 30.0–36.0)
MCV: 94.3 fL (ref 80.0–100.0)
Monocytes Absolute: 0.5 10*3/uL (ref 0.1–1.0)
Monocytes Relative: 5 %
Neutro Abs: 6.6 10*3/uL (ref 1.7–7.7)
Neutrophils Relative %: 57 %
Platelets: 387 10*3/uL (ref 150–400)
RBC: 4.41 MIL/uL (ref 3.87–5.11)
RDW: 12.7 % (ref 11.5–15.5)
WBC: 11.5 10*3/uL — ABNORMAL HIGH (ref 4.0–10.5)
nRBC: 0 % (ref 0.0–0.2)

## 2018-11-13 LAB — URINALYSIS, ROUTINE W REFLEX MICROSCOPIC
Bilirubin Urine: NEGATIVE
Glucose, UA: NEGATIVE mg/dL
Hgb urine dipstick: NEGATIVE
Ketones, ur: NEGATIVE mg/dL
Leukocytes,Ua: NEGATIVE
Nitrite: NEGATIVE
Protein, ur: NEGATIVE mg/dL
Specific Gravity, Urine: 1.005 (ref 1.005–1.030)
pH: 6 (ref 5.0–8.0)

## 2018-11-13 LAB — TSH: TSH: 2.236 u[IU]/mL (ref 0.350–4.500)

## 2018-11-13 MED ORDER — POTASSIUM CHLORIDE CRYS ER 20 MEQ PO TBCR
40.0000 meq | EXTENDED_RELEASE_TABLET | Freq: Once | ORAL | Status: AC
  Administered 2018-11-13: 40 meq via ORAL
  Filled 2018-11-13: qty 2

## 2018-11-13 MED ORDER — SODIUM CHLORIDE 0.9 % IV BOLUS
1000.0000 mL | Freq: Once | INTRAVENOUS | Status: AC
  Administered 2018-11-13: 1000 mL via INTRAVENOUS

## 2018-11-13 MED ORDER — MECLIZINE HCL 25 MG PO TABS
25.0000 mg | ORAL_TABLET | Freq: Once | ORAL | Status: AC
  Administered 2018-11-13: 25 mg via ORAL
  Filled 2018-11-13: qty 1

## 2018-11-13 MED ORDER — MECLIZINE HCL 25 MG PO TABS: 25.0000 mg | ORAL_TABLET | Freq: Three times a day (TID) | ORAL | 0 refills | Status: DC | PRN

## 2018-11-13 NOTE — Telephone Encounter (Signed)
Patient is calling with dizziness, buzzing in ears, when counting heart rate- she had flutter. Advised ED per protocol.  Reason for Disposition . Extra heart beats OR irregular heart beating  (i.e., "palpitations")  Answer Assessment - Initial Assessment Questions 1. DESCRIPTION: "Describe your dizziness."     Floating, tingling all over 2. LIGHTHEADED: "Do you feel lightheaded?" (e.g., somewhat faint, woozy, weak upon standing)     Off and on faintness, weak with standing 3. VERTIGO: "Do you feel like either you or the room is spinning or tilting?" (i.e. vertigo)     no 4. SEVERITY: "How bad is it?"  "Do you feel like you are going to faint?" "Can you stand and walk?"   - MILD - walking normally   - MODERATE - interferes with normal activities (e.g., work, school)    - SEVERE - unable to stand, requires support to walk, feels like passing out now.      moderate 5. ONSET:  "When did the dizziness begin?"     Last night 6. AGGRAVATING FACTORS: "Does anything make it worse?" (e.g., standing, change in head position)     Standing and walking 7. HEART RATE: "Can you tell me your heart rate?" "How many beats in 15 seconds?"  (Note: not all patients can do this)       P 92 8. CAUSE: "What do you think is causing the dizziness?"    Patient is being treated for infection- prednisone and cedinir 9. RECURRENT SYMPTOM: "Have you had dizziness before?" If so, ask: "When was the last time?" "What happened that time?"     Patient has fainted in the past- no diagnosed- heat relates 10. OTHER SYMPTOMS: "Do you have any other symptoms?" (e.g., fever, chest pain, vomiting, diarrhea, bleeding)       Cold hand/feet, hands shaking, ringing /buzzing in ears to heartbeat 11. PREGNANCY: "Is there any chance you are pregnant?" "When was your last menstrual period?"       n/a  Protocols used: DIZZINESS [facility]

## 2018-11-13 NOTE — ED Provider Notes (Signed)
[facility] DEPT Provider Note   CSN: [id number] Arrival date & time: 11/13/18  1216     History   Chief Complaint Chief Complaint  Patient presents with   Dizziness    HPI Leslie Douglas is a 53 y.o. female with history of chronic bronchitis, borderline personality disorder, anxiety, binge eating disorder, inflammatory bowel disease presents for evaluation of acute onset, persistent and progressively worsening dizziness, palpitations since yesterday.  Reports room spinning sensation and lightheadedness and feeling unsteady when she ambulates all at once.  Also notes associated tinnitus and some hearing loss when the dizziness comes on.  She had an episode yesterday evening that lasted around an hour and then resolved then around 8 AM today while at work she had a recurrence in her symptoms and they have been persistent since then.  Symptoms also occur when her eyes are closed, do not worsen with rapidly turning her head.  Denies nausea, vomiting, chest pain, shortness of breath, abdominal pain.  Reports "tunnel vision" but denies diplopia or blurred vision.  Reports feeling that her heart rate has been irregular.  Also notes feeling as though her extremities are cold.  She was recently started on a course of cefdinir x10 days, prednisone x5 days, and Tessalon as needed for cough for management of sinusitis and bronchitis on 11/07/2018.  She is a non-smoker, denies recreational drug use or excessive alcohol intake.     The history is provided by the patient.    Past Medical History:  Diagnosis Date   Allergy    Anxiety    Binge eating disorder    Borderline personality disorder ([place])    COPD (chronic obstructive pulmonary disease) (HCC)    chronic bronchitis   Depression    History of chicken pox    Pancolitis ([place])    Skin cancer, basal cell    basal cell    Patient Active Problem List   Diagnosis Date Noted   Abnormal LFTs  10/14/2018   Positive Anti-Smooth Muscle antibody  10/04/2018   History of allergy to aspirin 09/09/2018   Anaphylaxis 05/14/2018   Nausea and vomiting 05/13/2018   Abnormal colonoscopy 04/04/2018   Chronic diarrhea 04/04/2018   Ulcerative pancolitis without complication ([place]) 19/41/7408   IBD (inflammatory bowel disease) 04/04/2018   History of cholecystectomy 04/04/2018   LUQ pain 04/04/2018   Basal cell carcinoma 10/20/2017   Anxiety with depression 10/20/2017   Binge eating disorder 10/20/2017    Past Surgical History:  Procedure Laterality Date   CHOLECYSTECTOMY  2003   MOHS SURGERY  10/2017   VAGINAL DELIVERY  1995   WISDOM TOOTH EXTRACTION  12/2017     OB History   No obstetric history on file.      Home Medications    Prior to Admission medications   Medication Sig Start Date End Date Taking? Authorizing Provider  amitriptyline (ELAVIL) 150 MG tablet Take 1 tablet by mouth daily. 08/15/18   [provider]  Biotin w/ Vitamins C & E (HAIR/SKIN/NAILS PO) Take by mouth.    [provider]  cetirizine (ZYRTEC) 10 MG tablet Take 5 mg by mouth daily.    [provider]  cholecalciferol (VITAMIN D3) 25 MCG (1000 UT) tablet Take 1,000 Units by mouth daily.    [provider]  EPINEPHrine 0.3 mg/0.3 mL IJ SOAJ injection Inject 0.3 mLs (0.3 mg total) into the muscle as needed for anaphylaxis. 03/01/18   Inda Coke, PA  fluticasone Asencion Islam)  50 MCG/ACT nasal spray Place 2 sprays into both nostrils daily. 08/08/18   Valentina Shaggy, MD  lamoTRIgine (LAMICTAL) 100 MG tablet Take 100 mg by mouth daily.    [provider]  meclizine (ANTIVERT) 25 MG tablet Take 1 tablet (25 mg total) by mouth 3 (three) times daily as needed for dizziness. 11/13/18   Rodell Perna A, PA-C  mesalamine (LIALDA) 1.2 g EC tablet Take 4 tablets (4.8 g total) by mouth daily with breakfast. 10/13/18   Mansouraty, Telford Nab., MD  Multiple  Vitamin (MULTIVITAMIN) tablet Take 1 tablet by mouth daily.    [provider]  ondansetron (ZOFRAN ODT) 8 MG disintegrating tablet Take 1 tablet (8 mg total) by mouth every 8 (eight) hours as needed for nausea or vomiting. 05/11/18   Mansouraty, Telford Nab., MD  propranolol (INDERAL) 10 MG tablet Take 1 tablet by mouth 3 (three) times daily as needed. 08/15/18   [provider]  triamcinolone ointment (KENALOG) 0.5 % Apply twice daily for a week Patient taking differently: Apply 1 application topically as needed. Apply twice daily for a week 04/06/18   Valentina Shaggy, MD    Family History Family History  Problem Relation Age of Onset   Hypertension Mother    Colon cancer Neg Hx    Esophageal cancer Neg Hx    Rectal cancer Neg Hx    Stomach cancer Neg Hx    Pancreatic cancer Neg Hx    Inflammatory bowel disease Neg Hx    Liver disease Neg Hx    Allergic rhinitis Neg Hx    Angioedema Neg Hx    Asthma Neg Hx    Eczema Neg Hx    Urticaria Neg Hx    Immunodeficiency Neg Hx     Social History Social History   Tobacco Use   Smoking status: Never Smoker   Smokeless tobacco: Never Used  Substance Use Topics   Alcohol use: Yes    Frequency: Never    Comment: rarely   Drug use: Never     Allergies   Doxycycline, Erythromycin, Sulfa antibiotics, and Penicillins   Review of Systems Review of Systems  Constitutional: Negative for chills and fever.  HENT: Positive for congestion and tinnitus.   Eyes: Negative for photophobia.  Respiratory: Negative for shortness of breath.   Cardiovascular: Negative for chest pain.  Gastrointestinal: Negative for abdominal pain, nausea and vomiting.  Neurological: Positive for dizziness and light-headedness. Negative for syncope, weakness and numbness.  All other systems reviewed and are negative.    Physical Exam Updated Vital Signs BP (!) 159/89    Pulse 92    Temp 97.7 F (36.5 C) (Oral)    Resp  16    Ht 5' 7"  (1.702 m)    Wt 128.8 kg    SpO2 100%    BMI 44.48 kg/m   Physical Exam Vitals signs and nursing note reviewed.  Constitutional:      General: She is not in acute distress.    Appearance: She is well-developed.  HENT:     Head: Normocephalic and atraumatic.     Right Ear: Ear canal and external ear normal. There is no impacted cerumen.     Left Ear: Ear canal and external ear normal. There is no impacted cerumen.     Ears:     Comments: Mid ear effusion bilaterally.  No erythema or bulging.  No swelling of the pinna or mastoid tenderness noted. Eyes:  General:        Right eye: No discharge.        Left eye: No discharge.     Extraocular Movements: Extraocular movements intact.     Right eye: Nystagmus present.     Left eye: Nystagmus present.     Conjunctiva/sclera: Conjunctivae normal.     Pupils: Pupils are equal, round, and reactive to light.     Comments: Mild horizontal nystagmus noted.  Neck:     Musculoskeletal: Normal range of motion and neck supple.     Vascular: No JVD.     Trachea: No tracheal deviation.  Cardiovascular:     Rate and Rhythm: Normal rate and regular rhythm.     Heart sounds: Normal heart sounds.  Pulmonary:     Effort: Pulmonary effort is normal.     Breath sounds: Normal breath sounds.  Abdominal:     General: Bowel sounds are normal. There is no distension.     Palpations: Abdomen is soft.     Tenderness: There is no abdominal tenderness. There is no guarding or rebound.  Musculoskeletal:        General: No tenderness.  Skin:    General: Skin is warm and dry.     Findings: No erythema.  Neurological:     Mental Status: She is alert and oriented to person, place, and time.     Cranial Nerves: No cranial nerve deficit.     Sensory: No sensory deficit.     Motor: No weakness.     Coordination: Coordination normal.     Gait: Gait normal.     Comments: Mental Status:  Alert, thought content appropriate, able to give a  coherent history. Speech fluent without evidence of aphasia. Able to follow 2 step commands without difficulty.  Cranial Nerves:  II:  Peripheral visual fields grossly normal, pupils equal, round, reactive to light III,IV, VI: ptosis not present, extra-ocular motions intact bilaterally  V,VII: smile symmetric, facial light touch sensation equal VIII: hearing grossly normal to voice  X: uvula elevates symmetrically  XI: bilateral shoulder shrug symmetric and strong XII: midline tongue extension without fassiculations Motor:  Normal tone. 5/5 strength of BUE and BLE major muscle groups including strong and equal grip strength and dorsiflexion/plantar flexion Sensory: light touch normal in all extremities. Cerebellar: normal finger-to-nose with bilateral upper extremities, Romberg sign absent. Gait: normal gait and balance, though reports feeling lightheaded and dizzy and unsteady when standing up and attempting to ambulate. Able to walk on toes and heels with ease.    Psychiatric:        Behavior: Behavior normal.      ED Treatments / Results  Labs (all labs ordered are listed, but only abnormal results are displayed) Labs Reviewed  BASIC METABOLIC PANEL - Abnormal; Notable for the following components:      Result Value   Potassium 3.4 (*)    All other components within normal limits  CBC WITH DIFFERENTIAL/PLATELET - Abnormal; Notable for the following components:   WBC 11.5 (*)    Lymphs Abs 4.2 (*)    All other components within normal limits  URINALYSIS, ROUTINE W REFLEX MICROSCOPIC - Abnormal; Notable for the following components:   Color, Urine STRAW (*)    All other components within normal limits  TSH    EKG EKG Interpretation  Date/Time:  Monday November 13 2018 12:24:09 EST Ventricular Rate:  101 PR Interval:    QRS Duration: 89 QT Interval:  366 QTC  Calculation: 475 R Axis:   -56 Text Interpretation: Sinus tachycardia Ventricular trigeminy Left anterior  fascicular block Consider anterior infarct Baseline wander in lead(s) I II aVR No previous tracing Confirmed by Blanchie Dessert [phone]) on 11/13/2018 12:50:18 PM   Radiology No results found.  Procedures Procedures (including critical care time)  Medications Ordered in ED Medications  sodium chloride 0.9 % bolus 1,000 mL (0 mLs Intravenous Stopped 11/13/18 1521)  meclizine (ANTIVERT) tablet 25 mg (25 mg Oral Given 11/13/18 1332)  potassium chloride SA (KLOR-CON) CR tablet 40 mEq (40 mEq Oral Given 11/13/18 1517)     Initial Impression / Assessment and Plan / ED Course  I have reviewed the triage vital signs and the nursing notes.  Pertinent labs & imaging results that were available during my care of the patient were reviewed by me and considered in my medical decision making (see chart for details).        Patient presenting for evaluation of dizziness, lightheadedness, subjectively feeling unsteady with ambulation, tinnitus, hearing loss.  Recently treated for sinusitis/bronchitis with a cephalosporin, 5-day course of prednisone, and Tessalon.  She is afebrile, mildly hypertensive in the ED.  Vital signs otherwise stable.  She is nontoxic in appearance.  She is neurovascularly intact.  She is ambulatory with steady gait and balance though endorses feeling unsteady with ambulation.  EKG today shows sinus tachycardia with occasional PVCs.  Lab work reviewed by me shows mild nonspecific leukocytosis; she was recently on prednisone so this is likely secondary to that.  No anemia, no metabolic derangements, no renal insufficiency.  Her potassium was a little bit low so this was replenished orally in the ED.  She was given IV fluids.  She is not orthostatic.  TSH is within normal limits.  She is also given meclizine.  On reevaluation she is resting comfortably no apparent distress.  She reports that she is feeling much better after the meclizine.  Suspect that she has labyrinthitis secondary to  her URI.  Doubt posterior circulation CVA though this was considered.  No fever or meningeal signs to suggest meningitis.  Doubt PE.  We discussed the utility of additional steroids and she would like to hold off on this at this time which I think is reasonable as her symptoms will likely resolve without further steroids.  Will discharge with meclizine.  Recommend follow-up with PCP for reevaluation of symptoms.  Discussed strict ED return precautions. Patient verbalized understanding of and agreement with plan and is safe for discharge home at this time.  Discussed with Dr. Maryan Rued who agrees with assessment and plan at this time.  Final Clinical Impressions(s) / ED Diagnoses   Final diagnoses:  Dizziness  Hypokalemia  Hypertension, unspecified type    ED Discharge Orders         Ordered    meclizine (ANTIVERT) 25 MG tablet  3 times daily PRN     11/13/18 1601           Renita Papa, PA-C 11/13/18 1611    Blanchie Dessert, MD 11/13/18 2209

## 2018-11-13 NOTE — ED Notes (Signed)
Pt ambulated to restroom without assitance

## 2018-11-13 NOTE — Telephone Encounter (Signed)
Noted  

## 2018-11-13 NOTE — ED Triage Notes (Signed)
Pt developed dizziness last night, more noted when walking. Coming off meds for sinus infection. Comes and goes, nothing makes it better or worse

## 2018-11-13 NOTE — Telephone Encounter (Signed)
See note

## 2018-11-13 NOTE — Discharge Instructions (Signed)
Your work-up today was reassuring.  You likely have what is called labyrinthitis which is inflammation of the inner ear.  You can take meclizine up to 3 times daily as needed for dizziness.  Drink plenty of fluids and get plenty of rest.  Continue taking your home medications as prescribed including your seasonal allergy medications.  Call your PCP or follow-up with an ENT on an outpatient basis.  Return to the emergency department if any concerning signs or symptoms develop such as severe headaches, vision changes, weakness to one side of the body, persistent vomiting, chest pains, or loss of consciousness.  If your blood pressure (BP) was elevated on multiple readings during this visit above 130 for the top number or above 80 for the bottom number, please have this repeated by your primary care provider within one month. You can also check your blood pressure when you are out at a pharmacy or grocery store. Many have machines that will check your blood pressure.  If your blood pressure remains elevated, please follow-up with your PCP.

## 2018-11-14 NOTE — Telephone Encounter (Signed)
Please see message. °

## 2018-11-14 NOTE — Telephone Encounter (Signed)
Copied from [place] [id number]. Topic: Quick Communication - See Telephone Encounter >> Nov 13, 2018  5:12 PM Loma Boston wrote: CRM for notification. See Telephone encounter for: 11/13/18.pt wants a note stating that it is okay for her to work from home on the 3rd and return on the 4th. Please FU with pt [phone]

## 2018-11-15 ENCOUNTER — Ambulatory Visit (INDEPENDENT_AMBULATORY_CARE_PROVIDER_SITE_OTHER): Payer: BC Managed Care – PPO | Admitting: Physician Assistant

## 2018-11-15 ENCOUNTER — Encounter: Payer: Self-pay | Admitting: Physician Assistant

## 2018-11-15 VITALS — Ht 67.0 in | Wt 284.0 lb

## 2018-11-15 DIAGNOSIS — H93A9 Pulsatile tinnitus, unspecified ear: Secondary | ICD-10-CM

## 2018-11-15 DIAGNOSIS — R42 Dizziness and giddiness: Secondary | ICD-10-CM | POA: Diagnosis not present

## 2018-11-15 DIAGNOSIS — F418 Other specified anxiety disorders: Secondary | ICD-10-CM | POA: Diagnosis not present

## 2018-11-15 NOTE — Progress Notes (Signed)
Virtual Visit via Video   I connected with Leslie Douglas on 11/15/18 at 11:40 AM EST by a video enabled telemedicine application and verified that I am speaking with the correct person using two identifiers. Location patient: Home Location provider: [facility], Office Persons participating in the virtual visit: Ayerim K Baila, Rouse PA-C, Anselmo Pickler, LPN   I discussed the limitations of evaluation and management by telemedicine and the availability of in person appointments. The patient expressed understanding and agreed to proceed.  I acted as a Education administrator for [employer], PA-C [employer], LPN  Subjective:   HPI:   Dizziness Patient was seen at urgent care on 10/26 for sinusitis.  She was prescribed Omnicef and prednisone.  She has been taking them both as prescribed and has completed her prednisone course.  On 11/2 she went to the ER for dizziness.  She was given IV fluids as well as meclizine, and they had concerns that she was dehydrated.  She feels that she is having dizziness, describes as both the room spinning and she is spinning when she goes from sitting to standing.  She has multiple drug allergies and she is wondering if maybe she is having some sort of reaction to the [facility], she has taken prednisone in the past without any difficulty.  Denies: Vision changes, slurred speech, nausea.  She does report that she is experiencing pulsatile tinnitus at times.  Anxiety Patient reports that she is having some increased anxiety and has a call out to her therapist to check in with her.  She denies suicidal or homicidal ideation at this time.  She has a psychiatrist, Dr. Daron Offer.  Denies any recent changes to her medications, including Lamictal or amitriptyline.   ROS: See pertinent positives and negatives per HPI.  Patient Active Problem List   Diagnosis Date Noted  . Abnormal LFTs 10/14/2018  . Positive Anti-Smooth Muscle antibody  10/04/2018  . History of  allergy to aspirin 09/09/2018  . Anaphylaxis 05/14/2018  . Nausea and vomiting 05/13/2018  . Abnormal colonoscopy 04/04/2018  . Chronic diarrhea 04/04/2018  . Ulcerative pancolitis without complication ([place]) 41/66/0630  . IBD (inflammatory bowel disease) 04/04/2018  . History of cholecystectomy 04/04/2018  . LUQ pain 04/04/2018  . Basal cell carcinoma 10/20/2017  . Anxiety with depression 10/20/2017  . Binge eating disorder 10/20/2017    Social History   Tobacco Use  . Smoking status: Never Smoker  . Smokeless tobacco: Never Used  Substance Use Topics  . Alcohol use: Yes    Frequency: Never    Comment: rarely    Current Outpatient Medications:  .  amitriptyline (ELAVIL) 150 MG tablet, Take 1 tablet by mouth daily., Disp: , Rfl:  .  benzonatate (TESSALON) 200 MG capsule, Take 200 mg by mouth 3 (three) times daily as needed., Disp: , Rfl:  .  Biotin w/ Vitamins C & E (HAIR/SKIN/NAILS PO), Take by mouth., Disp: , Rfl:  .  cefdinir (OMNICEF) 300 MG capsule, Take 300 mg by mouth 2 (two) times daily., Disp: , Rfl:  .  cetirizine (ZYRTEC) 10 MG tablet, Take 5 mg by mouth daily., Disp: , Rfl:  .  cholecalciferol (VITAMIN D3) 25 MCG (1000 UT) tablet, Take 1,000 Units by mouth daily., Disp: , Rfl:  .  EPINEPHrine 0.3 mg/0.3 mL IJ SOAJ injection, Inject 0.3 mLs (0.3 mg total) into the muscle as needed for anaphylaxis., Disp: 2 Device, Rfl: 2 .  fluticasone (FLONASE) 50 MCG/ACT nasal spray, Place  2 sprays into both nostrils daily., Disp: 32 g, Rfl: 16 .  lamoTRIgine (LAMICTAL) 100 MG tablet, Take 100 mg by mouth daily., Disp: , Rfl:  .  meclizine (ANTIVERT) 25 MG tablet, Take 1 tablet (25 mg total) by mouth 3 (three) times daily as needed for dizziness., Disp: 20 tablet, Rfl: 0 .  mesalamine (LIALDA) 1.2 g EC tablet, Take 4 tablets (4.8 g total) by mouth daily with breakfast., Disp: 60 tablet, Rfl: 3 .  Multiple Vitamin (MULTIVITAMIN) tablet, Take 1 tablet by mouth daily., Disp: , Rfl:  .   ondansetron (ZOFRAN ODT) 8 MG disintegrating tablet, Take 1 tablet (8 mg total) by mouth every 8 (eight) hours as needed for nausea or vomiting., Disp: 20 tablet, Rfl: 2 .  propranolol (INDERAL) 10 MG tablet, Take 1 tablet by mouth 3 (three) times daily as needed., Disp: , Rfl:  .  triamcinolone ointment (KENALOG) 0.5 %, Apply twice daily for a week (Patient taking differently: Apply 1 application topically as needed. Apply twice daily for a week), Disp: 30 g, Rfl: 1  Allergies  Allergen Reactions  . Doxycycline Rash and Swelling    Mouth and face  . Erythromycin Shortness Of Breath  . Sulfa Antibiotics Shortness Of Breath  . Penicillins Rash    Objective:   VITALS: Per patient if applicable, see vitals. GENERAL: Alert, appears well and in no acute distress. HEENT: Atraumatic, conjunctiva clear, no obvious abnormalities on inspection of external nose and ears. NECK: Normal movements of the head and neck. CARDIOPULMONARY: No increased WOB. Speaking in clear sentences. I:E ratio WNL.  MS: Moves all visible extremities without noticeable abnormality. PSYCH: Pleasant and cooperative, well-groomed. Speech normal rate and rhythm. Affect is appropriate. Insight and judgement are appropriate. Attention is focused, linear, and appropriate.  NEURO: CN grossly intact. Oriented as arrived to appointment on time with no prompting. Moves both UE equally.  SKIN: No obvious lesions, wounds, erythema, or cyanosis noted on face or hands.  Assessment and Plan:   [name] was seen today for follow-up.  Diagnoses and all orders for this visit:  Dizziness/pulsatile tinnitus No red flags on discussion with patient.  Does not appear to be in any acute distress at this time.  Recommended that she stop the antibiotic in case she is having some reaction from this.  We will put in referral to ENT for further evaluation and treatment.  I also recommended that she really push fluids.  Worsening precautions advised  in the interim.  Anxiety with depression Denies suicidal or homicidal ideation at this time.  She has already reached out to her therapist and is awaiting response.  Provided emotional support and recommended that she reach out to her therapist again soon if she does not hear back from her.  . Reviewed expectations re: course of current medical issues. . Discussed self-management of symptoms. . Outlined signs and symptoms indicating need for more acute intervention. . Patient verbalized understanding and all questions were answered. Marland Kitchen Health Maintenance issues including appropriate healthy diet, exercise, and smoking avoidance were discussed with patient. . See orders for this visit as documented in the electronic medical record.  I discussed the assessment and treatment plan with the patient. The patient was provided an opportunity to ask questions and all were answered. The patient agreed with the plan and demonstrated an understanding of the instructions.   The patient was advised to call back or seek an in-person evaluation if the symptoms worsen or if the condition fails  to improve as anticipated.   CMA or LPN served as scribe during this visit. History, Physical, and Plan performed by medical provider. The above documentation has been reviewed and is accurate and complete.   [place], [place] 11/15/2018

## 2018-11-24 ENCOUNTER — Ambulatory Visit: Payer: BC Managed Care – PPO | Admitting: Gastroenterology

## 2018-11-24 ENCOUNTER — Other Ambulatory Visit (INDEPENDENT_AMBULATORY_CARE_PROVIDER_SITE_OTHER): Payer: BC Managed Care – PPO

## 2018-11-24 ENCOUNTER — Encounter: Payer: Self-pay | Admitting: Gastroenterology

## 2018-11-24 VITALS — BP 132/88 | HR 100 | Temp 98.3°F | Ht 67.0 in | Wt 291.0 lb

## 2018-11-24 DIAGNOSIS — R760 Raised antibody titer: Secondary | ICD-10-CM

## 2018-11-24 DIAGNOSIS — R945 Abnormal results of liver function studies: Secondary | ICD-10-CM

## 2018-11-24 DIAGNOSIS — R7989 Other specified abnormal findings of blood chemistry: Secondary | ICD-10-CM

## 2018-11-24 DIAGNOSIS — K529 Noninfective gastroenteritis and colitis, unspecified: Secondary | ICD-10-CM | POA: Diagnosis not present

## 2018-11-24 LAB — HEPATIC FUNCTION PANEL
ALT: 39 U/L — ABNORMAL HIGH (ref 0–35)
AST: 25 U/L (ref 0–37)
Albumin: 4.1 g/dL (ref 3.5–5.2)
Alkaline Phosphatase: 59 U/L (ref 39–117)
Bilirubin, Direct: 0.1 mg/dL (ref 0.0–0.3)
Total Bilirubin: 0.3 mg/dL (ref 0.2–1.2)
Total Protein: 7.1 g/dL (ref 6.0–8.3)

## 2018-11-24 MED ORDER — MESALAMINE 1.2 G PO TBEC: 4.8000 g | DELAYED_RELEASE_TABLET | Freq: Every day | ORAL | 3 refills | Status: DC

## 2018-11-24 MED ORDER — ONDANSETRON 8 MG PO TBDP: 8.0000 mg | ORAL_TABLET | Freq: Three times a day (TID) | ORAL | 4 refills | Status: DC | PRN

## 2018-11-24 NOTE — Progress Notes (Signed)
chro

## 2018-11-24 NOTE — Patient Instructions (Addendum)
Your provider has requested that you go to the basement level for lab work before leaving today. Press "B" on the elevator. The lab is located at the first door on the left as you exit the elevator.  You will also need labs in Jan 2021. We will send you a letter to remind you when it ts time to have those labs done.   We have sent the following medications to your pharmacy for you to pick up at your convenience: Lialda and Zofran   Thank you for choosing me and [facility].  Dr. Rush Landmark

## 2018-11-25 ENCOUNTER — Encounter: Payer: Self-pay | Admitting: Gastroenterology

## 2018-11-25 DIAGNOSIS — K529 Noninfective gastroenteritis and colitis, unspecified: Secondary | ICD-10-CM | POA: Insufficient documentation

## 2018-11-25 NOTE — Progress Notes (Signed)
[facility] VISIT   Primary Care Provider Inda Coke, [place] [address] [place] [address] [phone]  Patient Profile: Leslie Douglas is a 53 y.o. female with a pmh significant for hx of BCC of the skin, Anxiety/MDD, COPD, Unclear Anaphylactoid reactions, Chronic Diarrhea with now working diagnosis of UC.  The patient presents to the [facility] for an evaluation and management of problem(s) noted below:  Problem List 1. Chronic colitis   2. IBD (inflammatory bowel disease)   3. Abnormal LFTs   4. Positive Anti-Smooth Muscle antibody      History of Present Illness: Please see initial consultation note and progress notes for full details of HPI.   Interval History We initiated Lialda 4.8 g on her last visit.  Overall, the patient does feel that she has made some improvement while being on it for the last 5 to 6 weeks.  From a GI perspective her IBD diary is as below.  She is not taking nonsteroidals as frequently now as she is already made her move.  She wonders about the ALT elevation still.  Otherwise she is happy with where things are moving but wonders if there will need to be other therapies.  She recently was diagnosed with potential labyrinthitis of her ears and requires steroid for a few days and that may have led to some issues with mania per her report.  IBD diary is as below: BMs per day - 3-4 (notes not having to use the restroom as quickly in the early morning) Nocturnal BMs - infrequently Blood - None Mucous - Less frequent Tenesmus - Denies Urgency - less than 50% of the time Incomplete evaluation - less than 20% Skin Manifestations - no new skin changes Eye Manifestations - no new vision change Joint Manifestations - unchanged joint discomfort  GI Review of Systems Positive as above Negative for vomiting, nausea, dysphagia, odynophagia, abdominal pain, bloating  Review of Systems General: Denies  fevers/chills HEENT: Denies oral lesions Cardiovascular: Denies chest pain Pulmonary: Denies shortness of breath Gastroenterological: See HPI Genitourinary: Denies darkened urine Hematological: Denies easy bruising Dermatological: As per HPI Psychological: Mood is stable Allergy & Immunology: No new changes in her underlying allergies and following with her allergist Musculoskeletal: No new significant arthralgias   Medications Current Outpatient Medications  Medication Sig Dispense Refill  . amitriptyline (ELAVIL) 150 MG tablet Take 1 tablet by mouth daily.    . Biotin w/ Vitamins C & E (HAIR/SKIN/NAILS PO) Take by mouth.    . cetirizine (ZYRTEC) 10 MG tablet Take 5 mg by mouth daily.    . cholecalciferol (VITAMIN D3) 25 MCG (1000 UT) tablet Take 1,000 Units by mouth daily.    Marland Kitchen EPINEPHrine 0.3 mg/0.3 mL IJ SOAJ injection Inject 0.3 mLs (0.3 mg total) into the muscle as needed for anaphylaxis. 2 Device 2  . fluticasone (FLONASE) 50 MCG/ACT nasal spray Place 2 sprays into both nostrils daily. 32 g 16  . lamoTRIgine (LAMICTAL) 100 MG tablet Take 100 mg by mouth daily.    . mesalamine (LIALDA) 1.2 g EC tablet Take 4 tablets (4.8 g total) by mouth daily with breakfast. 60 tablet 3  . Multiple Vitamin (MULTIVITAMIN) tablet Take 1 tablet by mouth daily.    . ondansetron (ZOFRAN ODT) 8 MG disintegrating tablet Take 1 tablet (8 mg total) by mouth every 8 (eight) hours as needed for nausea or vomiting. 20 tablet 4  . propranolol (INDERAL) 10 MG tablet Take 1 tablet  by mouth 3 (three) times daily as needed.    . triamcinolone ointment (KENALOG) 0.5 % Apply twice daily for a week (Patient taking differently: Apply 1 application topically as needed. Apply twice daily for a week) 30 g 1   No current facility-administered medications for this visit.     Allergies Allergies  Allergen Reactions  . Doxycycline Rash and Swelling    Mouth and face  . Erythromycin Shortness Of Breath  . Sulfa  Antibiotics Shortness Of Breath  . Penicillins Rash    Histories Past Medical History:  Diagnosis Date  . Allergy   . Anxiety   . Binge eating disorder   . Borderline personality disorder ([place])   . COPD (chronic obstructive pulmonary disease) (HCC)    chronic bronchitis  . Depression   . History of chicken pox   . Pancolitis ([place])   . Skin cancer, basal cell    basal cell   Past Surgical History:  Procedure Laterality Date  . CHOLECYSTECTOMY  2003  . MOHS SURGERY  10/2017  . VAGINAL DELIVERY  1995  . WISDOM TOOTH EXTRACTION  12/2017   Social History   Socioeconomic History  . Marital status: Legally Separated    Spouse name: Not on file  . Number of children: 3  . Years of education: Not on file  . Highest education level: Not on file  Occupational History  . Occupation: Aeronautical engineer    Comment: [employer]  Social Needs  . Financial resource strain: Not on file  . Food insecurity    Worry: Not on file    Inability: Not on file  . Transportation needs    Medical: Not on file    Non-medical: Not on file  Tobacco Use  . Smoking status: Never Smoker  . Smokeless tobacco: Never Used  Substance and Sexual Activity  . Alcohol use: Yes    Frequency: Never    Comment: rarely  . Drug use: Never  . Sexual activity: Not Currently    Birth control/protection: None  Lifestyle  . Physical activity    Days per week: Not on file    Minutes per session: Not on file  . Stress: Not on file  Relationships  . Social Herbalist on phone: Not on file    Gets together: Not on file    Attends religious service: Not on file    Active member of club or organization: Not on file    Attends meetings of clubs or organizations: Not on file    Relationship status: Not on file  . Intimate partner violence    Fear of current or ex partner: Not on file    Emotionally abused: Not on file    Physically abused: Not on file    Forced sexual activity: Not on file  Other  Topics Concern  . Not on file  Social History Narrative   Divorced   Works for [employer], has Associate Degree   3 children   Family History  Problem Relation Age of Onset  . Hypertension Mother   . Colon cancer Neg Hx   . Esophageal cancer Neg Hx   . Rectal cancer Neg Hx   . Stomach cancer Neg Hx   . Pancreatic cancer Neg Hx   . Inflammatory bowel disease Neg Hx   . Liver disease Neg Hx   . Allergic rhinitis Neg Hx   . Angioedema Neg Hx   . Asthma Neg Hx   .  Eczema Neg Hx   . Urticaria Neg Hx   . Immunodeficiency Neg Hx    I have reviewed her medical, social, and family history in detail and updated the electronic medical record as necessary.    PHYSICAL EXAMINATION  BP 132/88   Pulse 100   Temp 98.3 F (36.8 C)   Ht 5' 7"  (1.702 m)   Wt 291 lb (132 kg)   BMI 45.58 kg/m  GEN: NAD, appears stated age, doesn't appear chronically ill PSYCH: Cooperative, without pressured speech EYE: Conjunctivae pink, sclerae anicteric ENT: MMM CV: RR without R/Gs  RESP: No wheezing present GI: NABS, soft, rounded, nontender, without rebound or guarding MSK/EXT: No lower extremity edema SKIN: No jaundice NEURO:  Alert & Oriented x 3, no focal deficits   REVIEW OF DATA  I reviewed the following data at the time of this encounter:  GI Procedures and Studies  Previously reviewed  Laboratory Studies  Reviewed in epic  Imaging Studies  No relevant studies to review   ASSESSMENT  Ms. Bouler is a 53 y.o. female with a pmh significant for hx of BCC of the skin, Anxiety/MDD, COPD, Unclear Anaphylactoid reactions, Chronic Diarrhea with now working diagnosis of UC.  The patient is seen today for evaluation and management of:  1. Chronic colitis   2. IBD (inflammatory bowel disease)   3. Abnormal LFTs   4. Positive Anti-Smooth Muscle antibody     Overall, the patient seems to be improved with the addition of Lialda.  We have not seen absolute benefit but I think based  on how she is describing some of her symptoms and her own personal thoughts on things she does feel better.  I am hopeful that with time we can continue to treat her with the all the and hopefully maintain her on this without having to increase to biologic or immune modulating therapies.  We will see.  Her inflammatory markers are not ever elevated but she has had an elevated fecal calprotectin.  I like to give her another 6 weeks and recheck her fecal calprotectin and see where things are at that point.  If we are seeing that is decreasing and she continues to have some moderate improvement then I think a repeat colonoscopic evaluation in early 2021 may be considered to evaluate for mucosal healing at some point.  The patient has a persistently elevated ALT we will recheck her ALT and we will keep in mind that she has a positive anti-smooth muscle antibody.  It is not in the significant elevation that I would think of for autoimmune hepatitis but we will have to keep that in mind.  We may consider the role of a liver biopsy in 2021.  All patient questions were answered, to the best of my ability, and the patient agrees to the aforementioned plan of action with follow-up as indicated.   PLAN  Laboratories as outlined below Continue Lialda 4.8 g daily (creatinine recently checked within the last 2 weeks) Continue diarrhea diary Follow-up in January after another 6 weeks of therapy and we will see what fecal calprotectin looks like Hopeful to hold on biologic/immune modulator therapy (consider Stelara or Entyvio for her in future)   Orders Placed This Encounter  Procedures  . Hepatic function panel    New Prescriptions   No medications on file   Modified Medications   Modified Medication Previous Medication   MESALAMINE (LIALDA) 1.2 G EC TABLET mesalamine (LIALDA) 1.2 g EC tablet  Take 4 tablets (4.8 g total) by mouth daily with breakfast.    Take 4 tablets (4.8 g total) by mouth daily with  breakfast.   ONDANSETRON (ZOFRAN ODT) 8 MG DISINTEGRATING TABLET ondansetron (ZOFRAN ODT) 8 MG disintegrating tablet      Take 1 tablet (8 mg total) by mouth every 8 (eight) hours as needed for nausea or vomiting.    Take 1 tablet (8 mg total) by mouth every 8 (eight) hours as needed for nausea or vomiting.    Planned Follow Up: No follow-ups on file.   Justice Britain, MD [facility] Office # [phone]

## 2018-11-27 ENCOUNTER — Other Ambulatory Visit: Payer: Self-pay

## 2018-11-27 ENCOUNTER — Encounter (INDEPENDENT_AMBULATORY_CARE_PROVIDER_SITE_OTHER): Payer: Self-pay | Admitting: Otolaryngology

## 2018-11-27 ENCOUNTER — Ambulatory Visit (INDEPENDENT_AMBULATORY_CARE_PROVIDER_SITE_OTHER): Payer: BC Managed Care – PPO | Admitting: Otolaryngology

## 2018-11-27 VITALS — Temp 97.2°F

## 2018-11-27 DIAGNOSIS — G629 Polyneuropathy, unspecified: Secondary | ICD-10-CM | POA: Diagnosis not present

## 2018-11-27 DIAGNOSIS — H8301 Labyrinthitis, right ear: Secondary | ICD-10-CM | POA: Diagnosis not present

## 2018-11-27 NOTE — Progress Notes (Signed)
HPI: Leslie Douglas is a 53 y.o. female who presents is referred by Inda Coke, PA for evaluation of dizziness and tinnitus. Patient states that two weeks ago, she had a sudden onset of dizziness/lightheadedness, irregular heart rhythm and bilateral ringing in her ears, R>L. She presented to the ED who diagnosed her with dehydration and labyrinthitis. She was given treatment and sent home. Her dizziness and ringing have gradually improved since that time. She still has some ringing today but her balance is normal. She denies any hearing loss today.  Patient also states that she has had an intermittent shooting pain on the left side of her nose since her Mohs surgery one year ago. She states it has increased in frequency since that time and only lasts seconds. It can come after turning her head or after a cold wind comes by. It starts to the left of her nose and shoots up to behind her eye. It is a very severe pain. She denies any difficulty breathing from her nose, nasal congestion or purulent nasal discharge. She has been treated for a sinus infection without benefit.  Past Medical History:  Diagnosis Date  . Allergy   . Anxiety   . Binge eating disorder   . Borderline personality disorder ([place])   . COPD (chronic obstructive pulmonary disease) (HCC)    chronic bronchitis  . Depression   . History of chicken pox   . Pancolitis ([place])   . Skin cancer, basal cell    basal cell   Past Surgical History:  Procedure Laterality Date  . CHOLECYSTECTOMY  2003  . MOHS SURGERY  10/2017  . VAGINAL DELIVERY  1995  . WISDOM TOOTH EXTRACTION  12/2017   Social History   Socioeconomic History  . Marital status: Legally Separated    Spouse name: Not on file  . Number of children: 3  . Years of education: Not on file  . Highest education level: Not on file  Occupational History  . Occupation: Aeronautical engineer    Comment: [employer]  Social Needs  . Financial resource strain: Not on file  .  Food insecurity    Worry: Not on file    Inability: Not on file  . Transportation needs    Medical: Not on file    Non-medical: Not on file  Tobacco Use  . Smoking status: Never Smoker  . Smokeless tobacco: Never Used  Substance and Sexual Activity  . Alcohol use: Yes    Frequency: Never    Comment: rarely  . Drug use: Never  . Sexual activity: Not Currently    Birth control/protection: None  Lifestyle  . Physical activity    Days per week: Not on file    Minutes per session: Not on file  . Stress: Not on file  Relationships  . Social Herbalist on phone: Not on file    Gets together: Not on file    Attends religious service: Not on file    Active member of club or organization: Not on file    Attends meetings of clubs or organizations: Not on file    Relationship status: Not on file  Other Topics Concern  . Not on file  Social History Narrative   Divorced   Works for [employer], has Associate Degree   3 children   Family History  Problem Relation Age of Onset  . Hypertension Mother   . Colon cancer Neg Hx   . Esophageal cancer Neg Hx   .  Rectal cancer Neg Hx   . Stomach cancer Neg Hx   . Pancreatic cancer Neg Hx   . Inflammatory bowel disease Neg Hx   . Liver disease Neg Hx   . Allergic rhinitis Neg Hx   . Angioedema Neg Hx   . Asthma Neg Hx   . Eczema Neg Hx   . Urticaria Neg Hx   . Immunodeficiency Neg Hx    Allergies  Allergen Reactions  . Doxycycline Rash and Swelling    Mouth and face  . Erythromycin Shortness Of Breath  . Sulfa Antibiotics Shortness Of Breath  . Penicillins Rash   Prior to Admission medications   Medication Sig Start Date End Date Taking? Authorizing Provider  amitriptyline (ELAVIL) 150 MG tablet Take 1 tablet by mouth daily. 08/15/18  Yes [provider]  Biotin w/ Vitamins C & E (HAIR/SKIN/NAILS PO) Take by mouth.   Yes [provider]  cetirizine (ZYRTEC) 10 MG tablet Take 5 mg by mouth  daily.   Yes [provider]  cholecalciferol (VITAMIN D3) 25 MCG (1000 UT) tablet Take 1,000 Units by mouth daily.   Yes [provider]  EPINEPHrine 0.3 mg/0.3 mL IJ SOAJ injection Inject 0.3 mLs (0.3 mg total) into the muscle as needed for anaphylaxis. 03/01/18  Yes Worley, Aldona Bar, PA  fluticasone (FLONASE) 50 MCG/ACT nasal spray Place 2 sprays into both nostrils daily. 08/08/18  Yes Valentina Shaggy, MD  lamoTRIgine (LAMICTAL) 100 MG tablet Take 100 mg by mouth daily.   Yes [provider]  mesalamine (LIALDA) 1.2 g EC tablet Take 4 tablets (4.8 g total) by mouth daily with breakfast. 11/24/18  Yes Mansouraty, Telford Nab., MD  Multiple Vitamin (MULTIVITAMIN) tablet Take 1 tablet by mouth daily.   Yes [provider]  ondansetron (ZOFRAN ODT) 8 MG disintegrating tablet Take 1 tablet (8 mg total) by mouth every 8 (eight) hours as needed for nausea or vomiting. 11/24/18  Yes Mansouraty, Telford Nab., MD  propranolol (INDERAL) 10 MG tablet Take 1 tablet by mouth 3 (three) times daily as needed. 08/15/18  Yes [provider]  triamcinolone ointment (KENALOG) 0.5 % Apply twice daily for a week Patient taking differently: Apply 1 application topically as needed. Apply twice daily for a week 04/06/18  Yes Valentina Shaggy, MD     Positive ROS: otherwise negative  All other systems have been reviewed and were otherwise negative with the exception of those mentioned in the HPI and as above.  Physical Exam: Constitutional: Alert, well-appearing, no acute distress Ears: External ears without lesions or tenderness. Ear canals are clear bilaterally with intact, clear TMs. Tuning forks reveal symmetric hearing bilaterally. Audiogram shows normal hearing on the left with a normal sloping to mild high frequency loss on the right. SRTs are 15 dB on the left and 20 dB on the right. Type A tympanograms bilaterally. Nasal: External nose without lesions. Septum  midline. Mild mucosal edema. No purulent discharge. Meatus region clear bilaterally. Oral: Lips and gums without lesions. Tongue and palate mucosa without lesions. Posterior oropharynx clear. Neck: No palpable adenopathy or masses Respiratory: Breathing comfortably  Skin: No facial/neck lesions or rash noted.  Procedures  Assessment: Labyrinthitis, improved Facial pain, intermittent, likely secondary to neuropathy  Plan: Reviewed audiogram with patient. Symptoms she experienced two weeks ago were likely secondary to a viral infection which is improving. Provided patient with Lipo-flavonoid samples if tinnitus continues. Facial pain patient describes likely secondary to a neuropathy that could have  been affected when patient had Mohs surgery. Recommend she follow up with her PCP and possibly consider referral to neurology. Patient will follow up PRN.  Christin Hoffstadt, PA-C  I agree with the assessment and plan as outlined above. Radene Journey, MD

## 2018-12-12 ENCOUNTER — Ambulatory Visit: Payer: BC Managed Care – PPO | Admitting: Allergy & Immunology

## 2019-01-11 ENCOUNTER — Emergency Department
Admission: EM | Admit: 2019-01-11 | Discharge: 2019-01-11 | Disposition: A | Discharge status: Home / Self Care | Payer: BC Managed Care – PPO | Attending: Emergency Medicine | Admitting: Emergency Medicine

## 2019-01-11 ENCOUNTER — Other Ambulatory Visit: Payer: Self-pay

## 2019-01-11 DIAGNOSIS — Z79899 Other long term (current) drug therapy: Secondary | ICD-10-CM | POA: Diagnosis not present

## 2019-01-11 DIAGNOSIS — T782XXA Anaphylactic shock, unspecified, initial encounter: Secondary | ICD-10-CM | POA: Diagnosis not present

## 2019-01-11 DIAGNOSIS — J449 Chronic obstructive pulmonary disease, unspecified: Secondary | ICD-10-CM | POA: Diagnosis not present

## 2019-01-11 DIAGNOSIS — R220 Localized swelling, mass and lump, head: Secondary | ICD-10-CM | POA: Diagnosis present

## 2019-01-11 MED ORDER — EPINEPHRINE 0.3 MG/0.3ML IJ SOAJ: 0.3000 mg | INTRAMUSCULAR | 1 refills | Status: AC | PRN

## 2019-01-11 MED ORDER — METHYLPREDNISOLONE SODIUM SUCC 125 MG IJ SOLR
125.0000 mg | Freq: Once | INTRAMUSCULAR | Status: AC
  Administered 2019-01-11: 125 mg via INTRAVENOUS
  Filled 2019-01-11: qty 2

## 2019-01-11 MED ORDER — DIPHENHYDRAMINE HCL 50 MG/ML IJ SOLN
25.0000 mg | Freq: Once | INTRAMUSCULAR | Status: AC
  Administered 2019-01-11: 25 mg via INTRAVENOUS
  Filled 2019-01-11: qty 1

## 2019-01-11 MED ORDER — FAMOTIDINE IN NACL 20-0.9 MG/50ML-% IV SOLN
20.0000 mg | Freq: Once | INTRAVENOUS | Status: AC
  Administered 2019-01-11: 20 mg via INTRAVENOUS
  Filled 2019-01-11: qty 50

## 2019-01-11 MED ORDER — PREDNISONE 50 MG PO TABS: 50.0000 mg | ORAL_TABLET | Freq: Every day | ORAL | 0 refills | Status: DC

## 2019-01-11 MED ORDER — SODIUM CHLORIDE 0.9 % IV BOLUS
500.0000 mL | Freq: Once | INTRAVENOUS | Status: AC
  Administered 2019-01-11: 500 mL via INTRAVENOUS

## 2019-01-11 NOTE — ED Provider Notes (Signed)
[facility] Emergency Department Provider Note   ____________________________________________    I have reviewed the triage vital signs and the nursing notes.   HISTORY  Chief Complaint Allergic Reaction     HPI Leslie Douglas is a 53 y.o. female with a significant history of allergic reactions presents with reports of facial swelling diffuse itching.  She is used her EpiPen twice, first time at 4 AM and the second time at 6 AM with significant improvement in facial and throat swelling.  Patient reports this started yesterday on the way home from work and she felt itching inside her ears and in her antecubital area bilaterally.  Seem to be relatively stable but worsened at 3 AM.  She does have a history of anaphylaxis  Past Medical History:  Diagnosis Date  . Allergy   . Anxiety   . Binge eating disorder   . Borderline personality disorder ([place])   . COPD (chronic obstructive pulmonary disease) (HCC)    chronic bronchitis  . Depression   . History of chicken pox   . Pancolitis ([place])   . Skin cancer, basal cell    basal cell    Patient Active Problem List   Diagnosis Date Noted  . Chronic colitis 11/25/2018  . Abnormal LFTs 10/14/2018  . Positive Anti-Smooth Muscle antibody  10/04/2018  . History of allergy to aspirin 09/09/2018  . Anaphylaxis 05/14/2018  . Nausea and vomiting 05/13/2018  . Abnormal colonoscopy 04/04/2018  . Chronic diarrhea 04/04/2018  . Ulcerative pancolitis without complication ([place]) 74/[id number]/7078  . IBD (inflammatory bowel disease) 04/04/2018  . History of cholecystectomy 04/04/2018  . LUQ pain 04/04/2018  . Basal cell carcinoma 10/20/2017  . Anxiety with depression 10/20/2017  . Binge eating disorder 10/20/2017    Past Surgical History:  Procedure Laterality Date  . CHOLECYSTECTOMY  2003  . MOHS SURGERY  10/2017  . VAGINAL DELIVERY  1995  . WISDOM TOOTH EXTRACTION  12/2017    Prior to Admission medications     Medication Sig Start Date End Date Taking? Authorizing Provider  amitriptyline (ELAVIL) 150 MG tablet Take 1 tablet by mouth daily. 08/15/18   [provider]  Biotin w/ Vitamins C & E (HAIR/SKIN/NAILS PO) Take by mouth.    [provider]  cetirizine (ZYRTEC) 10 MG tablet Take 5 mg by mouth daily.    [provider]  cholecalciferol (VITAMIN D3) 25 MCG (1000 UT) tablet Take 1,000 Units by mouth daily.    [provider]  EPINEPHrine (EPIPEN 2-PAK) 0.3 mg/0.3 mL IJ SOAJ injection Inject 0.3 mLs (0.3 mg total) into the muscle as needed for anaphylaxis. 01/11/19   Lavonia Drafts, MD  EPINEPHrine 0.3 mg/0.3 mL IJ SOAJ injection Inject 0.3 mLs (0.3 mg total) into the muscle as needed for anaphylaxis. 03/01/18   Inda Coke, PA  fluticasone (FLONASE) 50 MCG/ACT nasal spray Place 2 sprays into both nostrils daily. 08/08/18   Valentina Shaggy, MD  lamoTRIgine (LAMICTAL) 100 MG tablet Take 100 mg by mouth daily.    [provider]  mesalamine (LIALDA) 1.2 g EC tablet Take 4 tablets (4.8 g total) by mouth daily with breakfast. 11/24/18   Mansouraty, Telford Nab., MD  Multiple Vitamin (MULTIVITAMIN) tablet Take 1 tablet by mouth daily.    [provider]  ondansetron (ZOFRAN ODT) 8 MG disintegrating tablet Take 1 tablet (8 mg total) by mouth every 8 (eight) hours as needed for nausea or vomiting. 11/24/18   Mansouraty,  Telford Nab., MD  predniSONE (DELTASONE) 50 MG tablet Take 1 tablet (50 mg total) by mouth daily with breakfast. 01/11/19   Lavonia Drafts, MD  propranolol (INDERAL) 10 MG tablet Take 1 tablet by mouth 3 (three) times daily as needed. 08/15/18   [provider]  triamcinolone ointment (KENALOG) 0.5 % Apply twice daily for a week Patient taking differently: Apply 1 application topically as needed. Apply twice daily for a week 04/06/18   Valentina Shaggy, MD     Allergies Doxycycline, Erythromycin, Sulfa antibiotics, and  Penicillins  Family History  Problem Relation Age of Onset  . Hypertension Mother   . Colon cancer Neg Hx   . Esophageal cancer Neg Hx   . Rectal cancer Neg Hx   . Stomach cancer Neg Hx   . Pancreatic cancer Neg Hx   . Inflammatory bowel disease Neg Hx   . Liver disease Neg Hx   . Allergic rhinitis Neg Hx   . Angioedema Neg Hx   . Asthma Neg Hx   . Eczema Neg Hx   . Urticaria Neg Hx   . Immunodeficiency Neg Hx     Social History Social History   Tobacco Use  . Smoking status: Never Smoker  . Smokeless tobacco: Never Used  Substance Use Topics  . Alcohol use: Yes    Comment: rarely  . Drug use: Never    Review of Systems  Constitutional: No dizziness Eyes: No visual changes.  ENT: Throat swelling now improved Cardiovascular: Denies chest pain. Respiratory: Denies shortness of breath. Gastrointestinal: No nausea or vomiting    Genitourinary: Negative for dysuria. Musculoskeletal: No joint swelling Skin: Erythema Neurological: Negative for headaches or weakness   ____________________________________________   PHYSICAL EXAM:  VITAL SIGNS: ED Triage Vitals [01/11/19 0739]  Enc Vitals Group     BP (!) 160/106     Pulse Rate (!) 111     Resp 18     Temp      Temp Source Oral     SpO2 100 %     Weight 131.5 kg (290 lb)     Height 1.702 m (5' 7" )     Head Circumference      Peak Flow      Pain Score 0     Pain Loc      Pain Edu?      Excl. in [place]?     Constitutional: Alert and oriented.   Nose: No congestion/rhinnorhea. Mouth/Throat: Mucous membranes are moist.  Pharynx normal, uvula normal Neck:  Painless ROM, no stridor Cardiovascular: Mild tachycardia grossly normal heart sounds.  Good peripheral circulation. Respiratory: Normal respiratory effort.  No retractions.  Gastrointestinal: Soft and nontender. No distention.  Musculoskeletal:  Warm and well perfused Neurologic:  Normal speech and language. No gross focal neurologic deficits are  appreciated.  Skin:  Skin is warm, dry and intact. No rash noted. Psychiatric: Mood and affect are normal. Speech and behavior are normal.  ____________________________________________   LABS (all labs ordered are listed, but only abnormal results are displayed)  Labs Reviewed - No data to display ____________________________________________  EKG  None ____________________________________________  RADIOLOGY  None ____________________________________________   PROCEDURES  Procedure(s) performed: No  Procedures   Critical Care performed: No ____________________________________________   INITIAL IMPRESSION / ASSESSMENT AND PLAN / ED COURSE  Pertinent labs & imaging results that were available during my care of the patient were reviewed by me and considered in my medical decision making (see chart for  details).  Patient presents with significant allergic reaction/anaphylaxis, has already given herself 2 epi injections.  Currently reports no throat or nasal swelling, normal breathing.  We will give IV Solu-Medrol, Benadryl, Pepcid, IV fluids and carefully monitor  ----------------------------------------- 10:20 AM on 01/11/2019 -----------------------------------------  Patient symptoms have been resolved for nearly 2 hours now.  She feels well.  She last used EpiPen at 6 AM.  She would like to go home and I think this is reasonable, will discharge on prednisone, strict return precautions discussed    ____________________________________________   FINAL CLINICAL IMPRESSION(S) / ED DIAGNOSES  Final diagnoses:  Anaphylaxis, initial encounter        Note:  This document was prepared using Dragon voice recognition software and may include unintentional dictation errors.   Lavonia Drafts, MD 01/11/19 1021

## 2019-01-11 NOTE — ED Triage Notes (Signed)
Pt states she woke up around 3am with rash all over and red, states she took an epi pen and another one around 6am. States she has had some swelling of the face and throat.

## 2019-01-26 ENCOUNTER — Encounter: Payer: Self-pay | Admitting: Physician Assistant

## 2019-01-29 ENCOUNTER — Ambulatory Visit (INDEPENDENT_AMBULATORY_CARE_PROVIDER_SITE_OTHER): Payer: BC Managed Care – PPO | Admitting: Physician Assistant

## 2019-01-29 ENCOUNTER — Telehealth: Payer: Self-pay

## 2019-01-29 ENCOUNTER — Encounter: Payer: Self-pay | Admitting: Physician Assistant

## 2019-01-29 ENCOUNTER — Other Ambulatory Visit: Payer: Self-pay

## 2019-01-29 DIAGNOSIS — R413 Other amnesia: Secondary | ICD-10-CM | POA: Diagnosis not present

## 2019-01-29 DIAGNOSIS — R41840 Attention and concentration deficit: Secondary | ICD-10-CM

## 2019-01-29 DIAGNOSIS — K529 Noninfective gastroenteritis and colitis, unspecified: Secondary | ICD-10-CM

## 2019-01-29 NOTE — Progress Notes (Signed)
Virtual Visit via Video   I connected with Leslie Douglas on 01/29/19 at  4:00 PM EST by a video enabled telemedicine application and verified that I am speaking with the correct person using two identifiers. Location patient: Home Location provider: [facility], Office Persons participating in the virtual visit: Gursimran K Zoye, Chandra PA-C, Anselmo Pickler, LPN   I discussed the limitations of evaluation and management by telemedicine and the availability of in person appointments. The patient expressed understanding and agreed to proceed.  I acted as a Education administrator for [employer], PA-C [employer], LPN  Subjective:   HPI:    Memory issues Pt c/o having memory issus and trouble concentrating.  She reports that she feels like it takes her more time than it normally does in order to remember what she is working on, why she walks into a room, word finding, difficulty remembering the point she was trying to make when speaking, making more typos than normal, and overall general forgetfulness.  She feels like overall symptoms are worsening with time.  Pt's therapist recommend she sees a Garment/textile technologist.  She states that she is sleeping well with amitriptyline, denies any concerns for sleep apnea.  She denies having any issues where she forgets where she is going when driving.  ROS: See pertinent positives and negatives per HPI.  Patient Active Problem List   Diagnosis Date Noted  . Chronic colitis 11/25/2018  . Abnormal LFTs 10/14/2018  . Positive Anti-Smooth Muscle antibody  10/04/2018  . History of allergy to aspirin 09/09/2018  . Anaphylaxis 05/14/2018  . Nausea and vomiting 05/13/2018  . Abnormal colonoscopy 04/04/2018  . Chronic diarrhea 04/04/2018  . Ulcerative pancolitis without complication ([place]) 32/44/0102  . IBD (inflammatory bowel disease) 04/04/2018  . History of cholecystectomy 04/04/2018  . LUQ pain 04/04/2018  . Basal cell carcinoma 10/20/2017  . Anxiety  with depression 10/20/2017  . Binge eating disorder 10/20/2017    Social History   Tobacco Use  . Smoking status: Never Smoker  . Smokeless tobacco: Never Used  Substance Use Topics  . Alcohol use: Yes    Comment: rarely    Current Outpatient Medications:  .  amitriptyline (ELAVIL) 150 MG tablet, Take 1 tablet by mouth daily., Disp: , Rfl:  .  Biotin w/ Vitamins C & E (HAIR/SKIN/NAILS PO), Take by mouth., Disp: , Rfl:  .  cetirizine (ZYRTEC) 10 MG tablet, Take 5 mg by mouth daily., Disp: , Rfl:  .  cholecalciferol (VITAMIN D3) 25 MCG (1000 UT) tablet, Take 1,000 Units by mouth daily., Disp: , Rfl:  .  EPINEPHrine (EPIPEN 2-PAK) 0.3 mg/0.3 mL IJ SOAJ injection, Inject 0.3 mLs (0.3 mg total) into the muscle as needed for anaphylaxis., Disp: 1 each, Rfl: 1 .  fluticasone (FLONASE) 50 MCG/ACT nasal spray, Place 2 sprays into both nostrils daily., Disp: 32 g, Rfl: 16 .  lamoTRIgine (LAMICTAL) 100 MG tablet, Take 100 mg by mouth daily., Disp: , Rfl:  .  mesalamine (LIALDA) 1.2 g EC tablet, Take 4 tablets (4.8 g total) by mouth daily with breakfast., Disp: 60 tablet, Rfl: 3 .  Multiple Vitamin (MULTIVITAMIN) tablet, Take 1 tablet by mouth daily., Disp: , Rfl:  .  ondansetron (ZOFRAN ODT) 8 MG disintegrating tablet, Take 1 tablet (8 mg total) by mouth every 8 (eight) hours as needed for nausea or vomiting., Disp: 20 tablet, Rfl: 4 .  propranolol (INDERAL) 10 MG tablet, Take 1 tablet by mouth 3 (three) times daily as  needed., Disp: , Rfl:  .  triamcinolone ointment (KENALOG) 0.5 %, Apply twice daily for a week (Patient taking differently: Apply 1 application topically as needed. Apply twice daily for a week), Disp: 30 g, Rfl: 1  Allergies  Allergen Reactions  . Doxycycline Rash and Swelling    Mouth and face  . Erythromycin Shortness Of Breath  . Sulfa Antibiotics Shortness Of Breath  . Penicillins Rash    Objective:   VITALS: Per patient if applicable, see vitals. GENERAL: Alert,  appears well and in no acute distress. HEENT: Atraumatic, conjunctiva clear, no obvious abnormalities on inspection of external nose and ears. NECK: Normal movements of the head and neck. CARDIOPULMONARY: No increased WOB. Speaking in clear sentences. I:E ratio WNL.  MS: Moves all visible extremities without noticeable abnormality. PSYCH: Pleasant and cooperative, well-groomed. Speech normal rate and rhythm. Affect is appropriate. Insight and judgement are appropriate. Attention is focused, linear, and appropriate.  NEURO: CN grossly intact. Oriented as arrived to appointment on time with no prompting. Moves both UE equally.  SKIN: No obvious lesions, wounds, erythema, or cyanosis noted on face or hands.  Assessment and Plan:   Mazel was seen today for memory issues.  Diagnoses and all orders for this visit:  Memory loss -     Ambulatory referral to Neurology  Concentration deficit -     Ambulatory referral to Neurology   Referral to neurology for further evaluation and treatment.  Patient verbalized understanding to plan.  No red flags on discussion.  . Reviewed expectations re: course of current medical issues. . Discussed self-management of symptoms. . Outlined signs and symptoms indicating need for more acute intervention. . Patient verbalized understanding and all questions were answered. Marland Kitchen Health Maintenance issues including appropriate healthy diet, exercise, and smoking avoidance were discussed with patient. . See orders for this visit as documented in the electronic medical record.  I discussed the assessment and treatment plan with the patient. The patient was provided an opportunity to ask questions and all were answered. The patient agreed with the plan and demonstrated an understanding of the instructions.   The patient was advised to call back or seek an in-person evaluation if the symptoms worsen or if the condition fails to improve as anticipated.   CMA or LPN  served as scribe during this visit. History, Physical, and Plan performed by medical provider. The above documentation has been reviewed and is accurate and complete.   [place], [place] 01/29/2019

## 2019-01-29 NOTE — Telephone Encounter (Signed)
-----  Message from [place], [place] sent at 11/24/2018  4:08 PM EST ----- Regarding: Labs Pt will need CBC, Cmet, ESR, CRP(HS), fecal calprotection(stool) in Jan 2021 - around 01/24/2019  Dx: Chronic Colitis

## 2019-01-29 NOTE — Telephone Encounter (Signed)
Spoke with pt this morning. Reminded her that it is time for her to have labs. Lab orders in epic. Pt will come in this week for labs.

## 2019-02-01 ENCOUNTER — Other Ambulatory Visit: Payer: Self-pay

## 2019-02-01 ENCOUNTER — Encounter: Payer: Self-pay | Admitting: Allergy & Immunology

## 2019-02-01 ENCOUNTER — Ambulatory Visit: Payer: BC Managed Care – PPO | Admitting: Allergy & Immunology

## 2019-02-01 VITALS — BP 126/88 | HR 109 | Temp 98.0°F | Resp 18 | Ht 67.0 in

## 2019-02-01 DIAGNOSIS — T782XXD Anaphylactic shock, unspecified, subsequent encounter: Secondary | ICD-10-CM | POA: Diagnosis not present

## 2019-02-01 DIAGNOSIS — J3089 Other allergic rhinitis: Secondary | ICD-10-CM | POA: Diagnosis not present

## 2019-02-01 DIAGNOSIS — J302 Other seasonal allergic rhinitis: Secondary | ICD-10-CM

## 2019-02-01 NOTE — Progress Notes (Signed)
FOLLOW UP  Date of Service/Encounter:  02/01/19   Assessment:   Anaphylaxis- unknown trigger  Seasonal and perennial allergic rhinitis(grasses, ragweed, trees, indoor molds, outdoor molds, dust mites, cat and dog)  Borderline personality disorder - initiating intense DBT    Chronic ulcerative colitis - followed by Dr.Mansouraty  Idiopathic anaphylaxis - unknown trigger   Leslie Douglas continues to have episodes of anaphylaxis, although the frequency seems to be decreasing. Her most recent episode is difficult to pin down with regards to a trigger. Her symptoms seem to be so classic for alpha gal syndrome, so we are going to send another alpha gal panel (it was negative around one year ago). I am also sending a stinging insect panel as well as a serum tryptase. Serum tryptase has been normal in the past. We again discussed Xolair as a means of immunomodulation. She is going to consider this.   Plan/Recommendations:   1. Seasonal and perennial allergic rhinitis (grasses, ragweed, trees, indoor molds, outdoor molds, dust mites, cat and dog) - Continue taking: Zyrtec (cetirizine) 40m tablet once daily and Flonase (fluticasone) two sprays per nostril daily as needed - You can use an extra dose of the antihistamine, if needed, for breakthrough symptoms.  - Consider nasal saline rinses 1-2 times daily to remove allergens from the nasal cavities as well as help with mucous clearance (this is especially helpful to do before the nasal sprays are given) - Consider allergy shots as a means of long-term control.   2. Anaphylactic reactions - with normal workup thus far (occurs ~ 4 times per year) - We are going to repeat the tryptase (was normal one year ago) and the alpha gal panel (to look for a red meat sensitivity).  - We are also getting a stinging insect panel just to check.  - Consider starting Xolair for immunomodulation.  3. Return in about 4 months (around 06/01/2019). This can be  an in-person, a virtual Webex or a telephone follow up visit.  Subjective:   Leslie Douglas a 54y.o. female presenting today for follow up of  Chief Complaint  Patient presents with  . Allergic Rhinitis   . Anxiety  . Allergic Reaction    stress related    Leslie K OHerdthas a history of the following: Patient Active Problem List   Diagnosis Date Noted  . Chronic colitis 11/25/2018  . Abnormal LFTs 10/14/2018  . Positive Anti-Smooth Muscle antibody  10/04/2018  . History of allergy to aspirin 09/09/2018  . Anaphylaxis 05/14/2018  . Nausea and vomiting 05/13/2018  . Abnormal colonoscopy 04/04/2018  . Chronic diarrhea 04/04/2018  . Ulcerative pancolitis without complication ([place] 088/50/2774 . IBD (inflammatory bowel disease) 04/04/2018  . History of cholecystectomy 04/04/2018  . LUQ pain 04/04/2018  . Basal cell carcinoma 10/20/2017  . Anxiety with depression 10/20/2017  . Binge eating disorder 10/20/2017    History obtained from: chart review and patient.  [name] is a 54y.o. female presenting for a follow up visit.  She was last seen in September 2020 when she came in for an aspirin challenge.  At that time, she did pass her aspirin challenge.  This was all done in anticipation of her starting an aspirin containing medication for her chronic ulcerative colitis.  [place] itching on [place] inside. This was all that she had and it never changed. She took a nap because of  her stomach pain and then went to bed at a regular time. She woke up at 3:30am and then had to go a few more times (diarrhea). She reports that she was feeling  "loopy". She used her EpiPen a couple of times before going to the emergency room. She was in the ED for four hours or so.   In the interim, she was seen in the emergency room on December 31 with anaphylaxis.  At that time, she received diphenhydramine,  methylprednisolone, and famotidine.  She continues to feel that this is stress triggered.   She is getting ready to go out on medical leave, starting Monday. She is going to be out for 6 weeks. She is going to be doing DBT therapy intensely for six weeks. 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  Otherwise, there have been no changes to her past medical history, surgical history, family history, or social history.    Review of Systems  Constitutional: Negative.  Negative for chills, fever, malaise/fatigue and weight loss.  HENT: Negative.  Negative for congestion, ear discharge and ear pain.   Eyes: Negative for pain, discharge and redness.  Respiratory: Negative for cough, sputum production, shortness of breath and wheezing.   Cardiovascular: Negative.  Negative for chest pain and palpitations.  Gastrointestinal: Negative for abdominal pain, constipation, diarrhea, heartburn, nausea and vomiting.  Skin: Positive for itching and rash.  Neurological: Negative for dizziness and headaches.  Endo/Heme/Allergies: Negative for environmental allergies. Does not bruise/bleed easily.       Objective:   Blood pressure 126/88, pulse (!) 109, temperature 98 F (36.7 C), temperature source Temporal, resp. rate 18, height 5' 7"  (1.702 m), SpO2 96 %. Body mass index is 45.42 kg/m.   Physical Exam:  Physical Exam  Constitutional: She appears well-developed.  Somewhat anxious female.   HENT:  Head: Normocephalic and atraumatic.  Right Ear: Tympanic membrane, external ear and ear canal normal.  Left Ear: Tympanic membrane, external ear and ear canal normal.  Nose: Mucosal edema and rhinorrhea present. No nasal deformity or septal deviation. No epistaxis. Right sinus exhibits no maxillary sinus tenderness and no frontal sinus tenderness. Left sinus exhibits no maxillary sinus tenderness and no frontal sinus tenderness.  Mouth/Throat: Uvula is midline  and oropharynx is clear and moist. Mucous membranes are not pale and not dry.  Turbinates enlarged bilaterally.   Eyes: Pupils are equal, round, and reactive to light. Conjunctivae and EOM are normal. Right eye exhibits no chemosis and no discharge. Left eye exhibits no chemosis and no discharge. Right conjunctiva is not injected. Left conjunctiva is not injected.  Cardiovascular: Normal rate, regular rhythm and normal heart sounds.  Respiratory: Effort normal and breath sounds normal. No accessory muscle usage. No tachypnea. No respiratory distress. She has no wheezes. She has no rhonchi. She has no rales. She exhibits no tenderness.  Moving air well in all lung fields.   Lymphadenopathy:    She has no cervical adenopathy.  Neurological: She is alert.  Skin: No abrasion, no petechiae and no rash noted. Rash is not papular, not vesicular and not urticarial. No erythema. No pallor.  No erythematous patches appreciated.   Psychiatric: She has a normal mood and affect.     Diagnostic studies: none     Salvatore Marvel, MD  [facility]

## 2019-02-01 NOTE — Patient Instructions (Addendum)
1. Seasonal and perennial allergic rhinitis (grasses, ragweed, trees, indoor molds, outdoor molds, dust mites, cat and dog) - Continue taking: Zyrtec (cetirizine) 31m tablet once daily and Flonase (fluticasone) two sprays per nostril daily as needed - You can use an extra dose of the antihistamine, if needed, for breakthrough symptoms.  - Consider nasal saline rinses 1-2 times daily to remove allergens from the nasal cavities as well as help with mucous clearance (this is especially helpful to do before the nasal sprays are given) - Consider allergy shots as a means of long-term control.   2. Anaphylactic reactions - with normal workup thus far (occurs ~ 4 times per year) - We are going to repeat the tryptase (was normal one year ago) and the alpha gal panel (to look for a red meat sensitivity).  - We are also getting a stinging insect panel just to check.  - Consider starting Xolair for immunomodulation.  3. Return in about 4 months (around 06/01/2019). This can be an in-person, a virtual Webex or a telephone follow up visit.   Please inform [place] any Emergency Department visits, hospitalizations, or changes in symptoms. Call [place] going to the ED for breathing or allergy symptoms since we might be able to fit you in for a sick visit. Feel free to contact [place]time with any questions, problems, or concerns.  It was a pleasure to see you again today! Good luck with your DBT sessions!   Websites that have reliable patient information: 1. American Academy of Asthma, Allergy, and Immunology: www.aaaai.org 2. Food Allergy Research and Education (FARE): foodallergy.org 3. Mothers of Asthmatics: http://www.asthmacommunitynetwork.org 4. American College of Allergy, Asthma, and Immunology: www.acaai.org   COVID-19 Vaccine Information can be found at: [email or web address] questions related to vaccine distribution or appointments,  please email vaccine@ .com or call [phone]     "Like" [facility] Facebook and Instagram for our latest updates!        Make sure you are registered to vote! If you have moved or changed any of your contact information, you will need to get this updated before voting!  In some cases, you MAY be able to register to vote online: [email or web address]

## 2019-02-04 LAB — ALLERGEN STINGING INSECT PANEL
Honeybee IgE: <0.1 kU/L
Hornet, White Face, IgE: <0.1 kU/L
Hornet, Yellow, IgE: <0.1 kU/L
Paper Wasp IgE: <0.1 kU/L
Yellow Jacket, IgE: <0.1 kU/L

## 2019-02-06 LAB — TRYPTASE: Tryptase: 6.6 ug/L (ref 2.2–13.2)

## 2019-02-06 LAB — ALPHA-GAL PANEL
Alpha Gal IgE*: <0.1 kU/L (ref <0.10)
Beef (Bos spp) IgE: <0.1 kU/L (ref <0.35)
Class Interpretation: 0
Class Interpretation: 0
Class Interpretation: 0
Lamb/Mutton (Ovis spp) IgE: <0.1 kU/L (ref <0.35)
Pork (Sus spp) IgE: <0.1 kU/L (ref <0.35)

## 2019-02-08 ENCOUNTER — Other Ambulatory Visit: Payer: Self-pay

## 2019-02-08 ENCOUNTER — Other Ambulatory Visit (INDEPENDENT_AMBULATORY_CARE_PROVIDER_SITE_OTHER): Payer: BC Managed Care – PPO

## 2019-02-08 DIAGNOSIS — K529 Noninfective gastroenteritis and colitis, unspecified: Secondary | ICD-10-CM | POA: Diagnosis not present

## 2019-02-08 LAB — COMPREHENSIVE METABOLIC PANEL
ALT: 52 U/L — ABNORMAL HIGH (ref 0–35)
AST: 37 U/L (ref 0–37)
Albumin: 4.2 g/dL (ref 3.5–5.2)
Alkaline Phosphatase: 61 U/L (ref 39–117)
BUN: 10 mg/dL (ref 6–23)
CO2: 27 mEq/L (ref 19–32)
Calcium: 9.9 mg/dL (ref 8.4–10.5)
Chloride: 103 mEq/L (ref 96–112)
Creatinine, Ser: 0.84 mg/dL (ref 0.40–1.20)
GFR: 70.22 mL/min (ref >60.00)
Glucose, Bld: 87 mg/dL (ref 70–99)
Potassium: 4.1 mEq/L (ref 3.5–5.1)
Sodium: 139 mEq/L (ref 135–145)
Total Bilirubin: 0.3 mg/dL (ref 0.2–1.2)
Total Protein: 7.1 g/dL (ref 6.0–8.3)

## 2019-02-08 LAB — CBC
HCT: 40.2 % (ref 36.0–46.0)
Hemoglobin: 13.6 g/dL (ref 12.0–15.0)
MCHC: 33.8 g/dL (ref 30.0–36.0)
MCV: 90.7 fl (ref 78.0–100.0)
Platelets: 406 10*3/uL — ABNORMAL HIGH (ref 150.0–400.0)
RBC: 4.43 Mil/uL (ref 3.87–5.11)
RDW: 13.3 % (ref 11.5–15.5)
WBC: 7.7 10*3/uL (ref 4.0–10.5)

## 2019-02-08 LAB — SEDIMENTATION RATE: Sed Rate: 23 mm/hr (ref 0–30)

## 2019-02-08 LAB — HIGH SENSITIVITY CRP: CRP, High Sensitivity: 1.69 mg/L (ref 0.000–5.000)

## 2019-02-12 ENCOUNTER — Telehealth: Payer: Self-pay

## 2019-02-12 ENCOUNTER — Other Ambulatory Visit: Payer: BC Managed Care – PPO

## 2019-02-12 DIAGNOSIS — K529 Noninfective gastroenteritis and colitis, unspecified: Secondary | ICD-10-CM

## 2019-02-12 MED ORDER — MESALAMINE 1.2 G PO TBEC: 4.8000 g | DELAYED_RELEASE_TABLET | Freq: Every day | ORAL | 3 refills | Status: DC

## 2019-02-12 NOTE — Addendum Note (Signed)
Addended by: Raliegh Ip on: 02/12/2019 10:29 AM   Modules accepted: Orders

## 2019-02-12 NOTE — Telephone Encounter (Signed)
Refill sent to pharmacy. Pt scheduled for OV 04/06/19. Pt informed.

## 2019-02-12 NOTE — Telephone Encounter (Signed)
-----   Message from Irving Copas., MD sent at 02/12/2019  5:02 PM EST ----- Regarding: Medication refill Leslie Douglas,Please see the MyChart message between patient and myself.  She needs a refill of Lialda, please place a few months of refills for the patient and ensure she has a follow-up with me in clinic in the coming weeks/month.Thanks.GM

## 2019-02-15 ENCOUNTER — Telehealth: Payer: Self-pay | Admitting: *Deleted

## 2019-02-15 NOTE — Telephone Encounter (Signed)
-----   Message from Valentina Shaggy, MD sent at 02/01/2019 11:01 PM EST ----- Possible Xolair start - although she is rather nervous about Xolair. She is nervous about a lot of things.

## 2019-02-15 NOTE — Telephone Encounter (Signed)
Reached out to patient regarding starting xolair for her reactions per Dr Ernst Bowler.  Per patient she is on medical leave and would like to wait at this time. I advised patient to reach back out when she decides she would like to start therapy and she agreed

## 2019-02-19 LAB — CALPROTECTIN, FECAL: Calprotectin, Fecal: 32 ug/g (ref 0–120)

## 2019-03-23 ENCOUNTER — Encounter: Payer: Self-pay | Admitting: Physician Assistant

## 2019-03-23 NOTE — Telephone Encounter (Signed)
Called and LVM to schedule appt

## 2019-03-26 NOTE — Progress Notes (Signed)
Leslie Douglas is a 54 y.o. female is here to discuss: Sjogrens's Syndrome  I acted as a Education administrator for [employer], PA-C Anselmo Pickler, LPN  History of Present Illness:   Chief Complaint  Patient presents with  . Discuss Sjogren's syndrome    HPI   Patient is here to discuss a multitude of health concerns.  Of note she did return to work last week after a 6-week hiatus due to her mental health.  Her symptoms started last week.  Dry Mouth; Dry Eyes Patient reports that she is experiencing dry mouth, dry eyes, dry skin.  She states that she looked up her symptoms and feels as though she could potentially have Sjogren's syndrome.  She sees Dr. Herbert Deaner for her eye care and actually had to miss an appointment recently due to health concerns.  She states that recently she woke up and the roof of her mouth and tongue were very dry, felt like her tongue was stuck to the roof of her mouth.  She does snore at night.  She is on amitriptyline, but has been on this for multiple years, denies any prior history with this causing dry mouth.  She states that she is currently trying to up her water intake.  Hand, ankle and feet swelling She has dry cough that seems to be intermittently bothering her. Cough is usually during the day. Has also experienced swelling in her lower extremities, has had issues in the left greater than right.  She denies any chest pain, shortness of breath, waking up in the middle of the night gasping for air, and requiring use of multiple pillows for breathing at night.  Fatigue She is uncertain if this is medication related or even mood related.  She states that she feels overall really tired.  She has never been tested for sleep apnea.  She is working on hydration.  She states that she eats normal foods, no dietary regimens.  She did recently start [place].  Denies any unusual bleeding, unintentional weight loss.  Toe changes Over the past month patient has noticed some  changes to the toes on her right foot.  She states at times her tips of toes develop whiteness and underlying grayness under the nails.  When her toes returned to normal color she gets a prickly sensation.  She is concerned that she may have Raynaud's.  She denies any numbness to the area or trauma.  Rash Patient reports that she has a history of rosacea, but feels as though the rash on her cheeks has been worsened recently.  She has not used anything specifically for this.  She is concerned that she may have lupus or some other underlying autoimmune disorder.  There are no preventive care reminders to display for this patient.  Past Medical History:  Diagnosis Date  . Allergy   . Anxiety   . Binge eating disorder   . Borderline personality disorder ([place])   . COPD (chronic obstructive pulmonary disease) (HCC)    chronic bronchitis  . Depression   . History of chicken pox   . Pancolitis ([place])   . Skin cancer, basal cell    basal cell     Social History   Socioeconomic History  . Marital status: Legally Separated    Spouse name: Not on file  . Number of children: 3  . Years of education: Not on file  . Highest education level: Not on file  Occupational History  . Occupation: Aeronautical engineer  Comment: [employer]  Tobacco Use  . Smoking status: Never Smoker  . Smokeless tobacco: Never Used  Substance and Sexual Activity  . Alcohol use: Yes    Comment: rarely  . Drug use: Never  . Sexual activity: Not Currently    Birth control/protection: None  Other Topics Concern  . Not on file  Social History Narrative   Divorced   Works for [employer], has Designer, industrial/product   3 children   Social Determinants of Radio broadcast assistant Strain:   . Difficulty of Paying Living Expenses:   Food Insecurity:   . Worried About Charity fundraiser in the Last Year:   . Arboriculturist in the Last Year:   Transportation Needs:   . Film/video editor (Medical):   Marland Kitchen  Lack of Transportation (Non-Medical):   Physical Activity:   . Days of Exercise per Week:   . Minutes of Exercise per Session:   Stress:   . Feeling of Stress :   Social Connections:   . Frequency of Communication with Friends and Family:   . Frequency of Social Gatherings with Friends and Family:   . Attends Religious Services:   . Active Member of Clubs or Organizations:   . Attends Archivist Meetings:   Marland Kitchen Marital Status:   Intimate Partner Violence:   . Fear of Current or Ex-Partner:   . Emotionally Abused:   Marland Kitchen Physically Abused:   . Sexually Abused:     Past Surgical History:  Procedure Laterality Date  . CHOLECYSTECTOMY  2003  . MOHS SURGERY  10/2017  . VAGINAL DELIVERY  1995  . WISDOM TOOTH EXTRACTION  12/2017    Family History  Problem Relation Age of Onset  . Hypertension Mother   . Colon cancer Neg Hx   . Esophageal cancer Neg Hx   . Rectal cancer Neg Hx   . Stomach cancer Neg Hx   . Pancreatic cancer Neg Hx   . Inflammatory bowel disease Neg Hx   . Liver disease Neg Hx   . Allergic rhinitis Neg Hx   . Angioedema Neg Hx   . Asthma Neg Hx   . Eczema Neg Hx   . Urticaria Neg Hx   . Immunodeficiency Neg Hx     PMHx, SurgHx, SocialHx, FamHx, Medications, and Allergies were reviewed in the Visit Navigator and updated as appropriate.   Patient Active Problem List   Diagnosis Date Noted  . Borderline personality disorder ([place]) 03/27/2019  . Chronic colitis 11/25/2018  . Abnormal LFTs 10/14/2018  . Positive Anti-Smooth Muscle antibody  10/04/2018  . History of allergy to aspirin 09/09/2018  . Anaphylaxis 05/14/2018  . Nausea and vomiting 05/13/2018  . Abnormal colonoscopy 04/04/2018  . Chronic diarrhea 04/04/2018  . Ulcerative pancolitis without complication ([place]) 52/84/1324  . IBD (inflammatory bowel disease) 04/04/2018  . History of cholecystectomy 04/04/2018  . LUQ pain 04/04/2018  . Basal cell carcinoma 10/20/2017  . Anxiety with  depression 10/20/2017  . Binge eating disorder 10/20/2017    Social History   Tobacco Use  . Smoking status: Never Smoker  . Smokeless tobacco: Never Used  Substance Use Topics  . Alcohol use: Yes    Comment: rarely  . Drug use: Never    Current Medications and Allergies:    Current Outpatient Medications:  .  amitriptyline (ELAVIL) 150 MG tablet, Take 1 tablet by mouth daily., Disp: , Rfl:  .  ARIPiprazole (ABILIFY) 2 MG tablet,  Take 2 mg by mouth in the morning and at bedtime., Disp: , Rfl:  .  Biotin w/ Vitamins C & E (HAIR/SKIN/NAILS PO), Take by mouth., Disp: , Rfl:  .  cetirizine (ZYRTEC) 10 MG tablet, Take 5 mg by mouth daily., Disp: , Rfl:  .  cholecalciferol (VITAMIN D3) 25 MCG (1000 UT) tablet, Take 1,000 Units by mouth daily., Disp: , Rfl:  .  EPINEPHrine (EPIPEN 2-PAK) 0.3 mg/0.3 mL IJ SOAJ injection, Inject 0.3 mLs (0.3 mg total) into the muscle as needed for anaphylaxis., Disp: 1 each, Rfl: 1 .  fluticasone (FLONASE) 50 MCG/ACT nasal spray, Place 2 sprays into both nostrils daily., Disp: 32 g, Rfl: 16 .  lamoTRIgine (LAMICTAL) 100 MG tablet, Take 100 mg by mouth daily., Disp: , Rfl:  .  mesalamine (LIALDA) 1.2 g EC tablet, Take 4 tablets (4.8 g total) by mouth daily with breakfast., Disp: 60 tablet, Rfl: 3 .  Multiple Vitamin (MULTIVITAMIN) tablet, Take 1 tablet by mouth daily., Disp: , Rfl:  .  ondansetron (ZOFRAN ODT) 8 MG disintegrating tablet, Take 1 tablet (8 mg total) by mouth every 8 (eight) hours as needed for nausea or vomiting., Disp: 20 tablet, Rfl: 4 .  propranolol (INDERAL) 10 MG tablet, Take 1 tablet by mouth 3 (three) times daily as needed., Disp: , Rfl:    Allergies  Allergen Reactions  . Doxycycline Rash and Swelling    Mouth and face  . Erythromycin Shortness Of Breath  . Sulfa Antibiotics Shortness Of Breath  . Penicillins Rash    Review of Systems   ROS  Negative unless otherwise specified per HPI.  Vitals:   Vitals:   03/27/19 0913    BP: 130/80  Pulse: 86  Temp: 97.9 F (36.6 C)  TempSrc: Temporal  SpO2: 97%  Weight: 294 lb 3.2 oz (133.4 kg)  Height: 5' 7"  (1.702 m)     Body mass index is 46.08 kg/m.   Physical Exam:    Physical Exam Vitals and nursing note reviewed.  Constitutional:      General: She is not in acute distress.    Appearance: She is well-developed. She is not ill-appearing or toxic-appearing.  Cardiovascular:     Rate and Rhythm: Normal rate and regular rhythm.     Pulses: Normal pulses.     Heart sounds: Normal heart sounds, S1 normal and S2 normal.     Comments: 1+ edema on left ankle and trace edema on right ankle  Normal capillary refill in right toes Pulmonary:     Effort: Pulmonary effort is normal.     Breath sounds: Normal breath sounds.  Feet:     Comments: No perceived decrease sensitivity to toes Skin:    General: Skin is warm and dry.     Comments: Erythematous area to bilateral cheeks, no pustules evident  Neurological:     Mental Status: She is alert.     GCS: GCS eye subscore is 4. GCS verbal subscore is 5. GCS motor subscore is 6.  Psychiatric:        Speech: Speech normal.        Behavior: Behavior normal. Behavior is cooperative.      Assessment and Plan:    Leslie Douglas was seen today for discuss sjogren's syndrome.  Diagnoses and all orders for this visit:  Fatigue, unspecified type Unclear etiology.  I do think that we need to rule out sleep apnea given her daytime fatigue, dry mouth in the morning, and ongoing weight  gain.  Did encourage her to continue her nutrition program.  Recommended that she avoid [employer] and continue to work on hydration.  She is also looking for a new job which could be playing a role to her mental health.  Follow-up based on lab results and clinical response.  No red flags on discussion. -     CBC with Differential/Platelet -     Comprehensive metabolic panel -     TSH -     B12 -     Urinalysis, Routine w reflex  microscopic  Rash and nonspecific skin eruption Rash likely consistent with rosacea however we are going to complete autoimmune testing at this time due to her concerns for Sjogren's syndrome and lupus.  Discussed using topical treatment for rosacea if her labs have returned negative.  We may consider topical brimonidine for her rash. -     ANA w/Reflex if Positive -     C-reactive protein -     CYCLIC CITRUL PEPTIDE ANTIBODY, IGG/IGA -     Rheumatoid factor -     Sedimentation rate  Dry mouth Will update labs to assess for possible Sjogren's syndrome.  Recommend pushing fluids.  Dry eyes I recommended that she follow-up with her eye doctor so they can pursue further testing to assess the severity of her dry eyes.  Patient is agreeable to this.  Toe anomaly She is not experiencing this anomaly today in the office.  I recommended that she send [name] a picture if it does occur again.  Ankle swelling, unspecified laterality Suspect dependent edema.  If labs are normal, will consider as needed Lasix to help with swelling.  Did discuss that if she develops any chest pain, shortness of breath or other cardiac concerns she needs to let us know so we can further evaluate potential causes. .  Reviewed expectations re: course of current medical issues. . Discussed self-management of symptoms. . Outlined signs and symptoms indicating need for more acute intervention. . Patient verbalized understanding and all questions were answered. . See orders for this visit as documented in the electronic medical record. . Patient received an After Visit Summary.  CMA or LPN served as scribe during this visit. History, Physical, and Plan performed by medical provider. The above documentation has been reviewed and is accurate and complete.   Inda Coke, PA-C [place] 03/27/2019  Follow-up: No follow-ups on file.

## 2019-03-27 ENCOUNTER — Other Ambulatory Visit: Payer: Self-pay

## 2019-03-27 ENCOUNTER — Ambulatory Visit (INDEPENDENT_AMBULATORY_CARE_PROVIDER_SITE_OTHER): Payer: BC Managed Care – PPO | Admitting: Physician Assistant

## 2019-03-27 ENCOUNTER — Encounter: Payer: Self-pay | Admitting: Physician Assistant

## 2019-03-27 VITALS — BP 130/80 | HR 86 | Temp 97.9°F | Ht 67.0 in | Wt 294.2 lb

## 2019-03-27 DIAGNOSIS — R682 Dry mouth, unspecified: Secondary | ICD-10-CM

## 2019-03-27 DIAGNOSIS — R21 Rash and other nonspecific skin eruption: Secondary | ICD-10-CM

## 2019-03-27 DIAGNOSIS — H04123 Dry eye syndrome of bilateral lacrimal glands: Secondary | ICD-10-CM | POA: Diagnosis not present

## 2019-03-27 DIAGNOSIS — R5383 Other fatigue: Secondary | ICD-10-CM | POA: Diagnosis not present

## 2019-03-27 DIAGNOSIS — M25473 Effusion, unspecified ankle: Secondary | ICD-10-CM

## 2019-03-27 DIAGNOSIS — Q742 Other congenital malformations of lower limb(s), including pelvic girdle: Secondary | ICD-10-CM

## 2019-03-27 DIAGNOSIS — F603 Borderline personality disorder: Secondary | ICD-10-CM | POA: Insufficient documentation

## 2019-03-27 LAB — C-REACTIVE PROTEIN: CRP: <1 mg/dL (ref 0.5–20.0)

## 2019-03-27 LAB — CBC WITH DIFFERENTIAL/PLATELET
Basophils Absolute: 0 10*3/uL (ref 0.0–0.1)
Basophils Relative: 0.4 % (ref 0.0–3.0)
Eosinophils Absolute: 0.1 10*3/uL (ref 0.0–0.7)
Eosinophils Relative: 2 % (ref 0.0–5.0)
HCT: 38.3 % (ref 36.0–46.0)
Hemoglobin: 12.8 g/dL (ref 12.0–15.0)
Lymphocytes Relative: 39.6 % (ref 12.0–46.0)
Lymphs Abs: 2 10*3/uL (ref 0.7–4.0)
MCHC: 33.3 g/dL (ref 30.0–36.0)
MCV: 92.5 fl (ref 78.0–100.0)
Monocytes Absolute: 0.2 10*3/uL (ref 0.1–1.0)
Monocytes Relative: 5 % (ref 3.0–12.0)
Neutro Abs: 2.6 10*3/uL (ref 1.4–7.7)
Neutrophils Relative %: 53 % (ref 43.0–77.0)
Platelets: 347 10*3/uL (ref 150.0–400.0)
RBC: 4.14 Mil/uL (ref 3.87–5.11)
RDW: 13.5 % (ref 11.5–15.5)
WBC: 5 10*3/uL (ref 4.0–10.5)

## 2019-03-27 LAB — URINALYSIS, ROUTINE W REFLEX MICROSCOPIC
Bilirubin Urine: NEGATIVE
Hgb urine dipstick: NEGATIVE
Ketones, ur: NEGATIVE
Leukocytes,Ua: NEGATIVE
Nitrite: NEGATIVE
RBC / HPF: NONE SEEN (ref 0–?)
Specific Gravity, Urine: 1.02 (ref 1.000–1.030)
Total Protein, Urine: NEGATIVE
Urine Glucose: NEGATIVE
Urobilinogen, UA: 0.2 (ref 0.0–1.0)
pH: 6 (ref 5.0–8.0)

## 2019-03-27 LAB — SEDIMENTATION RATE: Sed Rate: 10 mm/hr (ref 0–30)

## 2019-03-27 LAB — COMPREHENSIVE METABOLIC PANEL
ALT: 41 U/L — ABNORMAL HIGH (ref 0–35)
AST: 30 U/L (ref 0–37)
Albumin: 4.1 g/dL (ref 3.5–5.2)
Alkaline Phosphatase: 63 U/L (ref 39–117)
BUN: 15 mg/dL (ref 6–23)
CO2: 29 mEq/L (ref 19–32)
Calcium: 9.5 mg/dL (ref 8.4–10.5)
Chloride: 102 mEq/L (ref 96–112)
Creatinine, Ser: 0.82 mg/dL (ref 0.40–1.20)
GFR: 72.16 mL/min (ref >60.00)
Glucose, Bld: 91 mg/dL (ref 70–99)
Potassium: 4 mEq/L (ref 3.5–5.1)
Sodium: 138 mEq/L (ref 135–145)
Total Bilirubin: 0.4 mg/dL (ref 0.2–1.2)
Total Protein: 6.9 g/dL (ref 6.0–8.3)

## 2019-03-27 LAB — TSH: TSH: 2.32 u[IU]/mL (ref 0.35–4.50)

## 2019-03-27 LAB — VITAMIN B12: Vitamin B-12: 609 pg/mL (ref 211–911)

## 2019-03-27 NOTE — Patient Instructions (Addendum)
It was great to see you!  I will be in touch with your lab results.  Someone from [facility] will be in touch regarding your sleep study evaluation.  Try to stay off google!  Take care,  Inda Coke PA-C

## 2019-03-28 LAB — RHEUMATOID FACTOR: Rheumatoid fact SerPl-aCnc: <14 IU/mL (ref <14)

## 2019-03-29 LAB — ANA W/REFLEX IF POSITIVE: Anti Nuclear Antibody (ANA): NEGATIVE

## 2019-03-29 LAB — CYCLIC CITRUL PEPTIDE ANTIBODY, IGG/IGA: Cyclic Citrullin Peptide Ab: 6 units (ref 0–19)

## 2019-03-30 ENCOUNTER — Encounter: Payer: Self-pay | Admitting: Physician Assistant

## 2019-03-30 ENCOUNTER — Other Ambulatory Visit: Payer: Self-pay | Admitting: Physician Assistant

## 2019-03-30 MED ORDER — FUROSEMIDE 20 MG PO TABS: 20.0000 mg | ORAL_TABLET | Freq: Every day | ORAL | 0 refills | Status: DC | PRN

## 2019-03-30 MED ORDER — POTASSIUM CHLORIDE CRYS ER 20 MEQ PO TBCR: 20.0000 meq | EXTENDED_RELEASE_TABLET | Freq: Every day | ORAL | 0 refills | Status: DC | PRN

## 2019-03-30 NOTE — Progress Notes (Signed)
laxi

## 2019-04-03 ENCOUNTER — Other Ambulatory Visit: Payer: Self-pay

## 2019-04-03 ENCOUNTER — Ambulatory Visit (INDEPENDENT_AMBULATORY_CARE_PROVIDER_SITE_OTHER): Payer: BC Managed Care – PPO | Admitting: Neurology

## 2019-04-03 ENCOUNTER — Encounter: Payer: Self-pay | Admitting: Neurology

## 2019-04-03 VITALS — BP 138/84 | HR 96 | Ht 67.0 in | Wt 297.8 lb

## 2019-04-03 DIAGNOSIS — R413 Other amnesia: Secondary | ICD-10-CM | POA: Diagnosis not present

## 2019-04-03 DIAGNOSIS — G500 Trigeminal neuralgia: Secondary | ICD-10-CM | POA: Diagnosis not present

## 2019-04-03 NOTE — Patient Instructions (Addendum)
1. Schedule MRI brain without contrast, MRI face/trigeminal with contrast  2. Schedule Neurocognitive testing  3. Continue follow-up with [facility] in there!   RECOMMENDATIONS FOR ALL PATIENTS WITH MEMORY PROBLEMS: 1. Continue to exercise (Recommend 30 minutes of walking everyday, or 3 hours every week) 2. Increase social interactions - continue going to [place] and enjoy social gatherings with friends and family 3. Eat healthy, avoid fried foods and eat more fruits and vegetables 4. Maintain adequate blood pressure, blood sugar, and blood cholesterol level. Reducing the risk of stroke and cardiovascular disease also helps promoting better memory. 5. Avoid stressful situations. Live a simple life and avoid aggravations. Organize your time and prepare for the next day in anticipation. 6. Sleep well, avoid any interruptions of sleep and avoid any distractions in the bedroom that may interfere with adequate sleep quality 7. Avoid sugar, avoid sweets as there is a strong link between excessive sugar intake, diabetes, and cognitive impairment The Mediterranean diet has been shown to help patients reduce the risk of progressive memory disorders and reduces cardiovascular risk. This includes eating fish, eat fruits and green leafy vegetables, nuts like almonds and hazelnuts, walnuts, and also use olive oil. Avoid fast foods and fried foods as much as possible. Avoid sweets and sugar as sugar use has been linked to worsening of memory function.
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
## 2019-04-06 ENCOUNTER — Ambulatory Visit: Payer: BC Managed Care – PPO | Admitting: Gastroenterology

## 2019-04-06 ENCOUNTER — Encounter: Payer: Self-pay | Admitting: Gastroenterology

## 2019-04-06 VITALS — BP 134/80 | HR 96 | Temp 98.7°F | Ht 67.0 in | Wt 295.0 lb

## 2019-04-06 DIAGNOSIS — K5100 Ulcerative (chronic) pancolitis without complications: Secondary | ICD-10-CM

## 2019-04-06 DIAGNOSIS — R760 Raised antibody titer: Secondary | ICD-10-CM

## 2019-04-06 DIAGNOSIS — R7989 Other specified abnormal findings of blood chemistry: Secondary | ICD-10-CM

## 2019-04-06 DIAGNOSIS — K760 Fatty (change of) liver, not elsewhere classified: Secondary | ICD-10-CM

## 2019-04-06 DIAGNOSIS — R945 Abnormal results of liver function studies: Secondary | ICD-10-CM

## 2019-04-06 DIAGNOSIS — Z01818 Encounter for other preprocedural examination: Secondary | ICD-10-CM

## 2019-04-06 MED ORDER — MESALAMINE 1.2 G PO TBEC: 4.8000 g | DELAYED_RELEASE_TABLET | Freq: Every day | ORAL | 3 refills | Status: DC

## 2019-04-06 MED ORDER — NA SULFATE-K SULFATE-MG SULF 17.5-3.13-1.6 GM/177ML PO SOLN: 1.0000 | ORAL | 0 refills | Status: AC

## 2019-04-06 NOTE — Patient Instructions (Addendum)
You have been scheduled for a colonoscopy. Please follow written instructions given to you at your visit today.  Please pick up your prep supplies at the pharmacy within the next 1-3 days. If you use inhalers (even only as needed), please bring them with you on the day of your procedure.  I value your feedback and thank you for entrusting [name] with your care. If you get a [facility] patient survey, I would appreciate you taking the time to let us know about your experience today. Thank you!   Due to recent changes in healthcare laws, you may see the results of your imaging and laboratory studies on MyChart before your provider has had a chance to review them.  We understand that in some cases there may be results that are confusing or concerning to you. Not all laboratory results come back in the same time frame and the provider may be waiting for multiple results in order to interpret others.  Please give [name] 48 hours in order for your provider to thoroughly review all the results before contacting the office for clarification of your results.    We have sent the following medications to your pharmacy for you to pick up at your convenience: Lialda

## 2019-04-07 ENCOUNTER — Encounter: Payer: Self-pay | Admitting: Gastroenterology

## 2019-04-07 NOTE — Progress Notes (Signed)
[facility]IS[facility]   Primary Care Provider Inda Coke, [place] [address] [place] [address] [phone]  Patient Profile: Leslie Douglas is a 54 y.o. female with a pmh significant for hx of BCC of the skin, Anxiety/MDD, COPD, Unclear Anaphylactoid reactions, Chronic Diarrhea with now working diagnosis of UC.  The patient presents to the [facility] for an evaluation and management of problem(s) noted below:  Problem List 1. Ulcerative pancolitis without complication ([place])   2. Abnormal LFTs   3. Positive Anti-Smooth Muscle antibody    4. Fatty liver   5. Encounter for other preprocedural examination     History of Present Illness: Please see initial consultation note and progress notes for full details of HPI.  Interval History The patient returns for scheduled follow-up.  She had recently given [name] a stool sample showing improvement in fecal calprotectin and it is now normal.  She remains on Lialda 4.8 g/day.  From a GI perspective her IBD diary is as below.  She is not taking significant nonsteroidals.  Unfortunately, her work has been significantly stressful requiring her to have a period away however she has restarted back at work.  Things become hectic.  She is planning a potential transition of work opportunities based on how things go.  She does not think that the stress is causing any progression or worsening of her symptoms, however.   IBD diary is as below: BMs per day - 2-4 Nocturnal BMs - infrequently Blood - None Mucous - Less frequent Tenesmus - Denies Urgency - less than 50% of the time Incomplete evaluation - less than 20% Skin Manifestations - no new skin changes Eye Manifestations - no new vision change Joint Manifestations - unchanged joint discomfort  GI Review of Systems Positive as above Negative for nausea, vomiting, dysphagia, pain, bloating   Review of Systems General: Denies  fevers/chills HEENT: Denies oral lesions/thrush Cardiovascular: Denies chest pain  Pulmonary: Denies shortness of breath Gastroenterological: See HPI Genitourinary: Denies darkened urine Hematological: Denies easy bruising Dermatological: As per HPI Psychological: Mood is anxious as a result of social stressors at work Allergy & Immunology: No new allergies Musculoskeletal: No new significant arthralgias   Medications Current Outpatient Medications  Medication Sig Dispense Refill  . amitriptyline (ELAVIL) 150 MG tablet Take 1 tablet by mouth daily.    . ARIPiprazole (ABILIFY) 2 MG tablet Take 4 mg by mouth in the morning and at bedtime.     . Biotin w/ Vitamins C & E (HAIR/SKIN/NAILS PO) Take by mouth.    . cetirizine (ZYRTEC) 10 MG tablet Take 5 mg by mouth daily.    . cholecalciferol (VITAMIN D3) 25 MCG (1000 UT) tablet Take 1,000 Units by mouth daily.    Marland Kitchen EPINEPHrine (EPIPEN 2-PAK) 0.3 mg/0.3 mL IJ SOAJ injection Inject 0.3 mLs (0.3 mg total) into the muscle as needed for anaphylaxis. 1 each 1  . fluticasone (FLONASE) 50 MCG/ACT nasal spray Place 2 sprays into both nostrils daily. 32 g 16  . furosemide (LASIX) 20 MG tablet Take 1 tablet (20 mg total) by mouth daily as needed for fluid. 30 tablet 0  . lamoTRIgine (LAMICTAL) 100 MG tablet Take 100 mg by mouth daily.    . mesalamine (LIALDA) 1.2 g EC tablet Take 4 tablets (4.8 g total) by mouth daily with breakfast. 60 tablet 3  . Multiple Vitamin (MULTIVITAMIN) tablet Take 1 tablet by mouth daily.    . ondansetron (ZOFRAN ODT) 8 MG disintegrating  tablet Take 1 tablet (8 mg total) by mouth every 8 (eight) hours as needed for nausea or vomiting. 20 tablet 4  . potassium chloride SA (KLOR-CON) 20 MEQ tablet Take 1 tablet (20 mEq total) by mouth daily as needed. 30 tablet 0  . propranolol (INDERAL) 10 MG tablet Take 1 tablet by mouth 3 (three) times daily as needed.    . Na Sulfate-K Sulfate-Mg Sulf 17.5-3.13-1.6 GM/177ML SOLN Take 1 kit  by mouth as directed. 354 mL 0   No current facility-administered medications for this visit.    Allergies Allergies  Allergen Reactions  . Doxycycline Rash and Swelling    Mouth and face  . Erythromycin Shortness Of Breath  . Sulfa Antibiotics Shortness Of Breath  . Penicillins Rash    Histories Past Medical History:  Diagnosis Date  . Allergy   . Anxiety   . Binge eating disorder   . Borderline personality disorder ([place])   . COPD (chronic obstructive pulmonary disease) (HCC)    chronic bronchitis  . Depression   . History of chicken pox   . Pancolitis ([place])   . Skin cancer, basal cell    basal cell   Past Surgical History:  Procedure Laterality Date  . CHOLECYSTECTOMY  2003  . MOHS SURGERY  10/2017  . VAGINAL DELIVERY  1995  . WISDOM TOOTH EXTRACTION  12/2017   Social History   Socioeconomic History  . Marital status: Legally Separated    Spouse name: Not on file  . Number of children: 3  . Years of education: Not on file  . Highest education level: Not on file  Occupational History  . Occupation: Aeronautical engineer    Comment: [employer]  Tobacco Use  . Smoking status: Never Smoker  . Smokeless tobacco: Never Used  Substance and Sexual Activity  . Alcohol use: Yes    Comment: rarely  . Drug use: Never  . Sexual activity: Not Currently    Birth control/protection: None  Other Topics Concern  . Not on file  Social History Narrative   Divorced   Works for [employer], has Designer, industrial/product   3 children   Right handed    Social Determinants of Radio broadcast assistant Strain:   . Difficulty of Paying Living Expenses:   Food Insecurity:   . Worried About Charity fundraiser in the Last Year:   . Arboriculturist in the Last Year:   Transportation Needs:   . Film/video editor (Medical):   Marland Kitchen Lack of Transportation (Non-Medical):   Physical Activity:   . Days of Exercise per Week:   . Minutes of Exercise per Session:   Stress:   .  Feeling of Stress :   Social Connections:   . Frequency of Communication with Friends and Family:   . Frequency of Social Gatherings with Friends and Family:   . Attends Religious Services:   . Active Member of Clubs or Organizations:   . Attends Archivist Meetings:   Marland Kitchen Marital Status:   Intimate Partner Violence:   . Fear of Current or Ex-Partner:   . Emotionally Abused:   Marland Kitchen Physically Abused:   . Sexually Abused:    Family History  Problem Relation Age of Onset  . Hypertension Mother   . Colon cancer Neg Hx   . Esophageal cancer Neg Hx   . Rectal cancer Neg Hx   . Stomach cancer Neg Hx   . Pancreatic cancer Neg Hx   .  Inflammatory bowel disease Neg Hx   . Liver disease Neg Hx   . Allergic rhinitis Neg Hx   . Angioedema Neg Hx   . Asthma Neg Hx   . Eczema Neg Hx   . Urticaria Neg Hx   . Immunodeficiency Neg Hx    I have reviewed her medical, social, and family history in detail and updated the electronic medical record as necessary.    PHYSICAL EXAMINATION  BP 134/80   Pulse 96   Temp 98.7 F (37.1 C)   Ht _0  (1.702 m)   Wt 295 lb (133.8 kg)   BMI 46.20 kg/m  GEN: NAD, appears stated age, doesn't appear chronically ill PSYCH: Cooperative, without pressured speech EYE: Conjunctivae pink, sclerae anicteric ENT: MMM CV: RR without R/Gs  RESP: No wheezing present GI: NABS, soft, rounded, nontender, without rebound or guarding MSK/EXT: No lower extremity edema SKIN: No jaundice NEURO:  Alert & Oriented x 3, no focal deficits   REVIEW OF DATA  I reviewed the following data at the time of this encounter:  GI Procedures and Studies  Previously reviewed  Laboratory Studies  Reviewed in epic once again  Imaging Studies  No relevant studies to review   ASSESSMENT  Ms. Presswood is a 54 y.o. female with a pmh significant for hx of BCC of the skin, Anxiety/MDD, COPD, Unclear Anaphylactoid reactions, Chronic Diarrhea with now working diagnosis of UC.   The patient is seen today for evaluation and management of:  1. Ulcerative pancolitis without complication ([place])   2. Abnormal LFTs   3. Positive Anti-Smooth Muscle antibody    4. Fatty liver   5. Encounter for other preprocedural examination    The patient is hemodynamically stable.  From the standpoint of the patient's hard data, we have an improved fecal calprotectin and continued stability in the patient's inflammatory markers via blood work.  However, the patient is still having symptoms.  It has been nearly a year since our endoscopic evaluation.  I think it is reasonable as the patient has been on therapy now for over 6 months for [name] to reevaluate and see if we have found that she has mucosal healing.  If she has and she has no significant mucosal problems, then we will need to think about other modalities to try and help some of her symptoms.  With that being said, if she continues to have inflammation then we will need to think about biologic therapy.  The risks and benefits of endoscopic evaluation were discussed with the patient; these include but are not limited to the risk of perforation, infection, bleeding, missed lesions, lack of diagnosis, severe illness requiring hospitalization, as well as anesthesia and sedation related illnesses.  The patient is agreeable to proceed.   She certainly could be experiencing some symptoms as a result of the significant stressors at work and so it will be interesting to see what we find and how things look from the inside.  She still has some abnormal liver biochemical testing and we will plan to further consider a liver biopsy later this year depending on how things are going.  All patient questions were answered, to the best of my ability, and the patient agrees to the aforementioned plan of action with follow-up as indicated.   PLAN  Continue Lialda 4.8 g daily Continue diarrhea diary Plan to proceed with surveillance colonoscopy to ensure mucosal  healing Hopeful to hold on biologic/immune modulator therapy (consider Stelara or Entyvio for her  in future) but may need to consider if she continues to have inflammation Her biopsy at some point in 2021 if ALT persists (remember she has elevated anti-smooth muscle antibody)   Orders Placed This Encounter  Procedures  . SARS Coronavirus 2 ([facility] ONLY)  . Ambulatory referral to Gastroenterology    New Prescriptions   NA SULFATE-K SULFATE-MG SULF 17.5-3.13-1.6 GM/177ML SOLN    Take 1 kit by mouth as directed.   Modified Medications   Modified Medication Previous Medication   MESALAMINE (LIALDA) 1.2 G EC TABLET mesalamine (LIALDA) 1.2 g EC tablet      Take 4 tablets (4.8 g total) by mouth daily with breakfast.    Take 4 tablets (4.8 g total) by mouth daily with breakfast.    Planned Follow Up: Return in about 4 weeks (around 05/04/2019) for post colonoscopy.   Total Time in Face-to-Face and in Coordination of Care for patient including independent/personal interpretation/review of prior testing, medical history, examination, medication adjustment, communicating results with the patient directly, and documentation with the EHR is 25 minutes.   Justice Britain, MD [facility]oscopy Office # [id number]

## 2019-04-08 DIAGNOSIS — Z01818 Encounter for other preprocedural examination: Secondary | ICD-10-CM | POA: Insufficient documentation

## 2019-04-08 DIAGNOSIS — K760 Fatty (change of) liver, not elsewhere classified: Secondary | ICD-10-CM | POA: Insufficient documentation

## 2019-04-12 ENCOUNTER — Other Ambulatory Visit: Payer: Self-pay

## 2019-04-12 ENCOUNTER — Ambulatory Visit (INDEPENDENT_AMBULATORY_CARE_PROVIDER_SITE_OTHER): Payer: BC Managed Care – PPO | Admitting: Family Medicine

## 2019-04-12 ENCOUNTER — Encounter (INDEPENDENT_AMBULATORY_CARE_PROVIDER_SITE_OTHER): Payer: Self-pay | Admitting: Family Medicine

## 2019-04-12 VITALS — BP 142/87 | HR 88 | Temp 97.8°F | Ht 67.0 in | Wt 297.0 lb

## 2019-04-12 DIAGNOSIS — R7401 Elevation of levels of liver transaminase levels: Secondary | ICD-10-CM

## 2019-04-12 DIAGNOSIS — E559 Vitamin D deficiency, unspecified: Secondary | ICD-10-CM | POA: Diagnosis not present

## 2019-04-12 DIAGNOSIS — Z0289 Encounter for other administrative examinations: Secondary | ICD-10-CM

## 2019-04-12 DIAGNOSIS — Z9189 Other specified personal risk factors, not elsewhere classified: Secondary | ICD-10-CM | POA: Diagnosis not present

## 2019-04-12 DIAGNOSIS — R5383 Other fatigue: Secondary | ICD-10-CM | POA: Diagnosis not present

## 2019-04-12 DIAGNOSIS — F5081 Binge eating disorder: Secondary | ICD-10-CM

## 2019-04-12 DIAGNOSIS — Z1331 Encounter for screening for depression: Secondary | ICD-10-CM

## 2019-04-12 DIAGNOSIS — Z6841 Body Mass Index (BMI) 40.0 and over, adult: Secondary | ICD-10-CM

## 2019-04-12 DIAGNOSIS — R0602 Shortness of breath: Secondary | ICD-10-CM

## 2019-04-12 DIAGNOSIS — K51819 Other ulcerative colitis with unspecified complications: Secondary | ICD-10-CM

## 2019-04-12 NOTE — Progress Notes (Signed)
Chief Complaint:   OBESITY ONIA SHIFLETT (MR# [id number]) is a 54 y.o. female who presents for evaluation and treatment of obesity and related comorbidities. Current BMI is Body mass index is 46.52 kg/m. Jaloni has been struggling with her weight for many years and has been unsuccessful in either losing weight, maintaining weight loss, or reaching her healthy weight goal.  Rashonda is currently in the action stage of change and ready to dedicate time achieving and maintaining a healthier weight. Cindia is interested in becoming our patient and working on intensive lifestyle modifications including (but not limited to) diet and exercise for weight loss.  Pleasant Hope works in [employer], 40 hours per week.  She is separated and lives alone. She is hoping for help with weight management. Note: Patient is followed by her PCP, Psychiatry, and GI. Most concerning for MWM, is that she has BED, BPD, with possible UC. Luckily, she is very aware of her diagnoses and open to treating them.   Maram's habits were reviewed today and are as follows: she struggles with family and or coworkers weight loss sabotage, her desired weight loss is 95+ pounds, she has been heavy most of her life, she started gaining weight her senior year in high school, her heaviest weight ever was her current weight, she craves fast food and desserts, she snacks frequently in the evenings, she skips breakfast, and sometimes lunch, frequently, she is frequently drinking liquids with calories, she frequently makes poor food choices, she frequently eats larger portions than normal, she has binge eating behaviors and she struggles with emotional eating.  Depression Screen Raylynn's Food and Mood (modified PHQ-9) score was 19.  Depression screen PHQ 2/9 04/12/2019  Decreased Interest 3  Down, Depressed, Hopeless 3  PHQ - 2 Score 6  Altered sleeping 0  Tired, decreased energy 3  Change in appetite 3  Feeling bad or  failure about yourself  3  Trouble concentrating 3  Moving slowly or fidgety/restless 0  Suicidal thoughts 1  PHQ-9 Score 19  Difficult doing work/chores Extremely dIfficult   Subjective:   1. Other fatigue Idalee denies daytime somnolence and sometimes wakes up still tired. Patent has a history of symptoms of morning fatigue. Margherita generally gets 4-6 hours of sleep per night, and states that she has generally restful sleep. Snoring is not present. Apneic episodes are not present. Epworth Sleepiness Score is 3.  2. SOB (shortness of breath) on exertion Ammi notes increasing shortness of breath with exercising and seems to be worsening over time with weight gain. She notes getting out of breath sooner with activity than she used to. Danyla denies shortness of breath at rest or orthopnea.  3. Other ulcerative colitis with complication ([place]) Jisel is followed by GI for this. Previous visit was reviewed together.   4. Vitamin D deficiency She is currently taking OTC vitamin D 1000 each day. She denies nausea, vomiting or muscle weakness.  5. Elevated ALT measurement Likely due to fatty liver.  Already monitored by GI.  Stable.  Lab Results  Component Value Date   ALT 41 (H) 03/27/2019   6. Binge eating disorder Jaquelyn is followed by Dr. Daron Offer, Psychiatry, and also goes to therapy at [facility].    7. Depression screening Mechell was screened for depression today as part of her new patient workup.  PHQ-9 is 19.  8. At risk for malnutrition Aubrea is at increased risk for malnutrition due to ulcerative colitis.  Assessment/Plan:  1. Other fatigue Bennette does feel that her weight is causing her energy to be lower than it should be. Fatigue may be related to obesity, depression or many other causes. Labs will be ordered, and in the meanwhile, Jacquelene will focus on self care including making healthy food choices, increasing physical activity and focusing on stress  reduction.  Orders - EKG 12-Lead - Insulin, random  2. SOB (shortness of breath) on exertion Semaya does feel that she gets out of breath more easily that she used to when she exercises. Aprel's shortness of breath appears to be obesity related and exercise induced. She has agreed to work on weight loss and gradually increase exercise to treat her exercise induced shortness of breath. Will continue to monitor closely.  3. Other ulcerative colitis with complication ([place]) Followed by GI for this problem. Those encounter notes were reviewed. Orders and follow up as documented in patient record.  4. Vitamin D deficiency Low Vitamin D level contributes to fatigue and are associated with obesity, breast, and colon cancer. She agrees to continue to take OTC Vitamin D @1 ,000 IU daily and will follow-up for routine testing of Vitamin D, at least 2-3 times per year to avoid over-replacement.  Orders - VITAMIN D 25 Hydroxy (Vit-D Deficiency, Fractures)  5. Elevated ALT measurement Followed by GI for this problem. Those encounter notes were reviewed. Lab results reviewed with patient. Counseling: Intensive lifestyle modifications are the first line treatment for this issue. We discussed several lifestyle modifications today and she will continue to work on diet, exercise and weight loss efforts. We will continue to monitor. Orders and follow up as documented in patient record.   6. Binge eating disorder Followed by Psychiatry for this problem. Those encounter notes were reviewed. Counseling: Intensive lifestyle modifications are the first line treatment for this issue. We discussed several lifestyle modifications today and she will continue to work on diet, exercise and weight loss efforts. Orders and follow up as documented in patient record.  7. Depression screening Stori had a positive depression screening. Depression is commonly associated with obesity and often results in emotional eating  behaviors. We will monitor this closely and work on CBT to help improve the non-hunger eating patterns. Referral to Psychology may be required if no improvement is seen as she continues in our clinic.  8. At risk for malnutrition Nili was given approximately 15 minutes of counseling today regarding prevention of malnutrition and ways to meet macronutrient goals..   9. Class 3 severe obesity with serious comorbidity and body mass index (BMI) of 45.0 to 49.9 in adult, unspecified obesity type ([place]) Ayo is currently in the action stage of change and her goal is to continue with weight loss efforts. I recommend Yuliana begin the structured treatment plan as follows:  She has agreed to practicing portion control and making smarter food choices, such as increasing vegetables and decreasing simple carbohydrates.  Exercise goals: No exercise has been prescribed at this time.   Behavioral modification strategies: keeping a strict food journal.  She was informed of the importance of frequent follow-up visits to maximize her success with intensive lifestyle modifications for her multiple health conditions. She was informed we would discuss her lab results at her next visit unless there is a critical issue that needs to be addressed sooner. Ayliana agreed to keep her next visit at the agreed upon time to discuss these results.  Objective:   Blood pressure (!) 142/87, pulse 88, temperature 97.8 F (36.6 C),  temperature source Oral, height 5' 7"  (1.702 m), weight 297 lb (134.7 kg), SpO2 99 %. Body mass index is 46.52 kg/m.  EKG: Normal sinus rhythm, rate 99 bpm.  Indirect Calorimeter completed today shows a VO2 of 260 and a REE of 1813.  Her calculated basal metabolic rate is 7366 thus her basal metabolic rate is worse than expected.  General: Cooperative, alert, well developed, in no acute distress. HEENT: Conjunctivae and lids unremarkable. Cardiovascular: Regular rhythm.  Lungs: Normal work  of breathing. Neurologic: No focal deficits.   Lab Results  Component Value Date   CREATININE 0.82 03/27/2019   BUN 15 03/27/2019   NA 138 03/27/2019   K 4.0 03/27/2019   CL 102 03/27/2019   CO2 29 03/27/2019   Lab Results  Component Value Date   ALT 41 (H) 03/27/2019   AST 30 03/27/2019   ALKPHOS 63 03/27/2019   BILITOT 0.4 03/27/2019   Lab Results  Component Value Date   HGBA1C 5.5 10/20/2017   Lab Results  Component Value Date   TSH 2.32 03/27/2019   Lab Results  Component Value Date   CHOL 224 (H) 10/20/2017   HDL 53 10/20/2017   LDLCALC 142 (H) 10/20/2017   TRIG 147 10/20/2017   CHOLHDL 4.2 10/20/2017   Lab Results  Component Value Date   WBC 5.0 03/27/2019   HGB 12.8 03/27/2019   HCT 38.3 03/27/2019   MCV 92.5 03/27/2019   PLT 347.0 03/27/2019   Lab Results  Component Value Date   IRON 89 09/21/2018   TIBC 346 09/21/2018   FERRITIN 90.3 09/21/2018   Attestation Statements:   This is the patient's first visit at [facility]. The patient's NEW PATIENT PACKET was reviewed at length. Included in the packet: current and past health history, medications, allergies, ROS, gynecologic history (women only), surgical history, family history, social history, weight history, weight loss surgery history (for those that have had weight loss surgery), nutritional evaluation, mood and food questionnaire, PHQ9, Epworth questionnaire, sleep habits questionnaire, patient life and health improvement goals questionnaire. These will all be scanned into the patient's chart under media.   During the visit, I independently reviewed the patient's EKG, bioimpedance scale results, and indirect calorimeter results. I used this information to tailor a meal plan for the patient that will help her to lose weight and will improve her obesity-related conditions going forward. I performed a medically necessary appropriate examination and/or evaluation. I discussed the  assessment and treatment plan with the patient. The patient was provided an opportunity to ask questions and all were answered. The patient agreed with the plan and demonstrated an understanding of the instructions. Labs were ordered at this visit and will be reviewed at the next visit unless more critical results need to be addressed immediately. Clinical information was updated and documented in the EMR.   I, Water quality scientist, CMA, am acting as Location manager for [employer], DO.  I have reviewed the above documentation for accuracy and completeness, and I agree with the above. Briscoe Deutscher, DO

## 2019-04-13 LAB — INSULIN, RANDOM: INSULIN: 18.8 u[IU]/mL (ref 2.6–24.9)

## 2019-04-13 LAB — VITAMIN D 25 HYDROXY (VIT D DEFICIENCY, FRACTURES): Vit D, 25-Hydroxy: 24.6 ng/mL — ABNORMAL LOW (ref 30.0–100.0)

## 2019-04-26 ENCOUNTER — Encounter (INDEPENDENT_AMBULATORY_CARE_PROVIDER_SITE_OTHER): Payer: Self-pay | Admitting: Family Medicine

## 2019-04-26 ENCOUNTER — Ambulatory Visit (INDEPENDENT_AMBULATORY_CARE_PROVIDER_SITE_OTHER): Payer: BC Managed Care – PPO | Admitting: Family Medicine

## 2019-04-26 ENCOUNTER — Other Ambulatory Visit: Payer: Self-pay

## 2019-04-26 VITALS — BP 116/81 | HR 102 | Temp 98.0°F | Ht 67.0 in | Wt 296.0 lb

## 2019-04-26 DIAGNOSIS — Z6841 Body Mass Index (BMI) 40.0 and over, adult: Secondary | ICD-10-CM

## 2019-04-26 DIAGNOSIS — E8881 Metabolic syndrome: Secondary | ICD-10-CM

## 2019-04-26 DIAGNOSIS — Z9189 Other specified personal risk factors, not elsewhere classified: Secondary | ICD-10-CM | POA: Diagnosis not present

## 2019-04-26 DIAGNOSIS — F5081 Binge eating disorder: Secondary | ICD-10-CM

## 2019-04-26 DIAGNOSIS — E559 Vitamin D deficiency, unspecified: Secondary | ICD-10-CM | POA: Diagnosis not present

## 2019-04-26 MED ORDER — BD PEN NEEDLE NANO 2ND GEN 32G X 4 MM MISC: 1.0000 | Freq: Every day | 0 refills | Status: DC

## 2019-04-26 MED ORDER — SAXENDA 18 MG/3ML ~~LOC~~ SOPN: 3.0000 mg | PEN_INJECTOR | Freq: Every day | SUBCUTANEOUS | 0 refills | Status: DC

## 2019-04-26 MED ORDER — VITAMIN D (ERGOCALCIFEROL) 1.25 MG (50000 UNIT) PO CAPS: 50000.0000 [IU] | ORAL_CAPSULE | ORAL | 0 refills | Status: DC

## 2019-04-26 NOTE — Progress Notes (Signed)
Chief Complaint:   OBESITY Leslie Douglas is here to discuss her progress with her obesity treatment plan along with follow-up of her obesity related diagnoses. Leslie Douglas is on practicing portion control and making smarter food choices, such as increasing vegetables and decreasing simple carbohydrates and states she is following her eating plan approximately 0% of the time. Leslie Douglas states she is exercising for 0 minutes 0 times per week.  Today's visit was #: 2 Starting weight: 297 lbs Starting date: 04/12/2019 Today's weight: 296 lbs Today's date: 04/26/2019 Total lbs lost to date: 1 lb Total lbs lost since last in-office visit: 1 lb  Interim History: [name] has been practicing mindfulness.  She was told to journal during the last 2 weeks.  She journaled foods and feelings.  We reviewed together and will scan into media.  Subjective:   1. Vitamin D deficiency Leslie Douglas's Vitamin D level was 24.6 on 04/12/2019. She is currently taking OTC vitamin D 1000 IU each day. She denies nausea, vomiting or muscle weakness.  2. Insulin resistance Leslie Douglas has a diagnosis of insulin resistance based on her elevated fasting insulin level >5. She continues to work on diet and exercise to decrease her risk of diabetes.  Lab Results  Component Value Date   INSULIN 18.8 04/12/2019   Lab Results  Component Value Date   HGBA1C 5.5 10/20/2017   3. Binge eating disorder She is followed by Dr. Daron Offer. In order to avoid triggering a binge, we plan to work slowly through healthy food choice education and habits. Leslie Douglas did a wonderful job of journaling over the past 2 weeks. Interestingly, her food choices improved consistently over the two week period. We reviewed "good days" and "bad days." She feels that planning, especially grocery shopping and having food available, helps her to avoid binging.   4. At risk for nausea Leslie Douglas is at risk for nausea due to starting Saxenda.  Assessment/Plan:   1. Vitamin D  deficiency Low Vitamin D level contributes to fatigue and are associated with obesity, breast, and colon cancer. She agrees to start to take prescription Vitamin D @50 ,000 IU every week and will follow-up for routine testing of Vitamin D, at least 2-3 times per year to avoid over-replacement.  Orders - Vitamin D, Ergocalciferol, (DRISDOL) 1.25 MG (50000 UNIT) CAPS capsule; Take 1 capsule (50,000 Units total) by mouth every 7 (seven) days.  Dispense: 4 capsule; Refill: 0  2. Insulin resistance Leslie Douglas will continue to work on weight loss, exercise, and decreasing simple carbohydrates to help decrease the risk of diabetes. Leslie Douglas agreed to follow-up with [name] as directed to closely monitor her progress.  3. Binge eating disorder Considering Vyvanse going forward. The patient has already discussed Saxenda and Vyvanse as choices for treatment (per our discussion at the last visit). He agrees to either/both medications but recommends watching for increased anxiety with Vyvnase.   4. At risk for nausea Leslie Douglas was given approximately 15 minutes of nausea prevention counseling today. Leslie Douglas is at risk for nausea due to her new or current medication. She was encouraged to titrate her medication slowly, make sure to stay hydrated, eat smaller portions throughout the day, and avoid high fat meals.   5. Class 3 severe obesity with serious comorbidity and body mass index (BMI) of 45.0 to 49.9 in adult, unspecified obesity type ([place])  Orders - Liraglutide -Weight Management (SAXENDA) 18 MG/3ML SOPN; Inject 0.5 mLs (3 mg total) into the skin daily.  Dispense: 1 pen; Refill:  0 - Insulin Pen Needle (BD PEN NEEDLE NANO 2ND GEN) 32G X 4 MM MISC; 1 Package by Does not apply route daily.  Dispense: 100 each; Refill: 0  Leslie Douglas is currently in the action stage of change. As such, her goal is to continue with weight loss efforts. She has agreed to practicing portion control and making smarter food choices,  such as increasing vegetables and decreasing simple carbohydrates.   Exercise goals: Recumbent bike 3 times a week for 15 minutes.  Behavioral modification strategies: emotional eating strategies.  Practice leaving food on the plate.  Leslie Douglas has agreed to follow-up with our clinic in 2 weeks. She was informed of the importance of frequent follow-up visits to maximize her success with intensive lifestyle modifications for her multiple health conditions.   Objective:   Blood pressure 116/81, pulse (!) 102, temperature 98 F (36.7 C), temperature source Oral, height 5' 7"  (1.702 m), weight 296 lb (134.3 kg), SpO2 98 %. Body mass index is 46.36 kg/m.  General: Cooperative, alert, well developed, in no acute distress. HEENT: Conjunctivae and lids unremarkable. Cardiovascular: Regular rhythm.  Lungs: Normal work of breathing. Neurologic: No focal deficits.   Lab Results  Component Value Date   CREATININE 0.82 03/27/2019   BUN 15 03/27/2019   NA 138 03/27/2019   K 4.0 03/27/2019   CL 102 03/27/2019   CO2 29 03/27/2019   Lab Results  Component Value Date   ALT 41 (H) 03/27/2019   AST 30 03/27/2019   ALKPHOS 63 03/27/2019   BILITOT 0.4 03/27/2019   Lab Results  Component Value Date   HGBA1C 5.5 10/20/2017   Lab Results  Component Value Date   INSULIN 18.8 04/12/2019   Lab Results  Component Value Date   TSH 2.32 03/27/2019   Lab Results  Component Value Date   CHOL 224 (H) 10/20/2017   HDL 53 10/20/2017   LDLCALC 142 (H) 10/20/2017   TRIG 147 10/20/2017   CHOLHDL 4.2 10/20/2017   Lab Results  Component Value Date   WBC 5.0 03/27/2019   HGB 12.8 03/27/2019   HCT 38.3 03/27/2019   MCV 92.5 03/27/2019   PLT 347.0 03/27/2019   Lab Results  Component Value Date   IRON 89 09/21/2018   TIBC 346 09/21/2018   FERRITIN 90.3 09/21/2018   Attestation Statements:   Reviewed by clinician on day of visit: allergies, medications, problem list, medical history,  surgical history, family history, social history, and previous encounter notes.  I, Water quality scientist, CMA, am acting as Location manager for [employer], DO.  I have reviewed the above documentation for accuracy and completeness, and I agree with the above. Briscoe Deutscher, DO

## 2019-05-01 ENCOUNTER — Encounter (INDEPENDENT_AMBULATORY_CARE_PROVIDER_SITE_OTHER): Payer: Self-pay | Admitting: Family Medicine

## 2019-05-03 ENCOUNTER — Encounter (INDEPENDENT_AMBULATORY_CARE_PROVIDER_SITE_OTHER): Payer: Self-pay

## 2019-05-03 ENCOUNTER — Ambulatory Visit (INDEPENDENT_AMBULATORY_CARE_PROVIDER_SITE_OTHER): Payer: BC Managed Care – PPO | Admitting: Counselor

## 2019-05-03 ENCOUNTER — Ambulatory Visit: Payer: BC Managed Care – PPO

## 2019-05-03 ENCOUNTER — Other Ambulatory Visit: Payer: Self-pay

## 2019-05-03 ENCOUNTER — Encounter: Payer: Self-pay | Admitting: Counselor

## 2019-05-03 DIAGNOSIS — R413 Other amnesia: Secondary | ICD-10-CM

## 2019-05-03 DIAGNOSIS — R4189 Other symptoms and signs involving cognitive functions and awareness: Secondary | ICD-10-CM

## 2019-05-03 DIAGNOSIS — F603 Borderline personality disorder: Secondary | ICD-10-CM

## 2019-05-03 NOTE — Progress Notes (Signed)
   Psychometrist Note   Cognitive testing was administered to Leslie Douglas by Lamar Benes, B.S. (Technician) under the supervision of Alphonzo Severance, Psy.D., ABN. Leslie Douglas was able to tolerate all test procedures. Dr. Nicole Kindred met with the patient as needed to manage any emotional reactions to the testing procedures (if applicable). Rest breaks were offered.    The battery of tests administered was selected by Dr. Nicole Kindred with consideration to the patient's current level of functioning, the nature of her symptoms, emotional and behavioral responses during the interview, level of literacy, observed level of motivation/effort, and the nature of the referral question. This battery was communicated to the psychometrist. Communication between Dr. Nicole Kindred and the psychometrist was ongoing throughout the evaluation and Dr. Nicole Kindred was immediately accessible at all times. Dr. Nicole Kindred provided supervision to the technician on the date of this service, to the extent necessary to assure the quality of all services provided.    Leslie Douglas will return in approximately one week for an interactive feedback session with Dr. Nicole Kindred, at which time female test performance, clinical impressions, and treatment recommendations will be reviewed in detail. The patient understands she can contact our office should she require our assistance before this time.   A total of 100 minutes of billable time were spent with Leslie Douglas by the technician, including test administration and scoring time. Billing for these services is reflected in Dr. Les Pou note.   This note reflects time spent with the psychometrician and does not include test scores, clinical history, or any interpretations made by Dr. Nicole Kindred. The full report will follow in a separate note.
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
## 2019-05-04 ENCOUNTER — Encounter: Payer: Self-pay | Admitting: Counselor

## 2019-05-04 ENCOUNTER — Other Ambulatory Visit: Payer: Self-pay | Admitting: Gastroenterology

## 2019-05-04 ENCOUNTER — Ambulatory Visit (INDEPENDENT_AMBULATORY_CARE_PROVIDER_SITE_OTHER): Payer: BC Managed Care – PPO

## 2019-05-04 DIAGNOSIS — Z1159 Encounter for screening for other viral diseases: Secondary | ICD-10-CM

## 2019-05-04 LAB — SARS CORONAVIRUS 2 (TAT 6-24 HRS): SARS Coronavirus 2: NEGATIVE

## 2019-05-05 ENCOUNTER — Ambulatory Visit
Admission: RE | Admit: 2019-05-05 | Discharge: 2019-05-05 | Disposition: A | Discharge status: Home / Self Care | Payer: BC Managed Care – PPO | Source: Ambulatory Visit | Attending: Neurology | Admitting: Neurology

## 2019-05-05 DIAGNOSIS — R413 Other amnesia: Secondary | ICD-10-CM

## 2019-05-05 DIAGNOSIS — G500 Trigeminal neuralgia: Secondary | ICD-10-CM

## 2019-05-05 MED ORDER — GADOBENATE DIMEGLUMINE 529 MG/ML IV SOLN
20.0000 mL | Freq: Once | INTRAVENOUS | Status: AC | PRN
  Administered 2019-05-05: 20 mL via INTRAVENOUS

## 2019-05-08 ENCOUNTER — Encounter: Payer: Self-pay | Admitting: Gastroenterology

## 2019-05-08 ENCOUNTER — Ambulatory Visit (AMBULATORY_SURGERY_CENTER): Payer: BC Managed Care – PPO | Admitting: Gastroenterology

## 2019-05-08 ENCOUNTER — Other Ambulatory Visit: Payer: Self-pay

## 2019-05-08 VITALS — BP 102/64 | HR 88 | Temp 97.1°F | Resp 21 | Ht 67.0 in | Wt 295.0 lb

## 2019-05-08 DIAGNOSIS — K5100 Ulcerative (chronic) pancolitis without complications: Secondary | ICD-10-CM

## 2019-05-08 DIAGNOSIS — K529 Noninfective gastroenteritis and colitis, unspecified: Secondary | ICD-10-CM | POA: Diagnosis not present

## 2019-05-08 DIAGNOSIS — K648 Other hemorrhoids: Secondary | ICD-10-CM | POA: Diagnosis not present

## 2019-05-08 DIAGNOSIS — K5150 Left sided colitis without complications: Secondary | ICD-10-CM | POA: Diagnosis not present

## 2019-05-08 MED ORDER — SODIUM CHLORIDE 0.9 % IV SOLN: 500.0000 mL | Freq: Once | INTRAVENOUS | Status: DC

## 2019-05-08 NOTE — Progress Notes (Signed)
Pt's states no medical or surgical changes since previsit or office visit. 

## 2019-05-08 NOTE — Patient Instructions (Signed)
YOU HAD AN ENDOSCOPIC PROCEDURE TODAY AT [facility]:   Refer to the procedure report that was given to you for any specific questions about what was found during the examination.  If the procedure report does not answer your questions, please call your gastroenterologist to clarify.  If you requested that your care partner not be given the details of your procedure findings, then the procedure report has been included in a sealed envelope for you to review at your convenience later Please read all handouts given to you by your recovery room  Nurse.  Thank-you for choosing [name] for your healthcare needs today.   YOU SHOULD EXPECT: Some feelings of bloating in the abdomen. Passage of more gas than usual.  Walking can help get rid of the air that was put into your GI tract during the procedure and reduce the bloating. If you had a lower endoscopy (such as a colonoscopy or flexible sigmoidoscopy) you may notice spotting of blood in your stool or on the toilet paper. If you underwent a bowel prep for your procedure, you may not have a normal bowel movement for a few days.  Please Note:  You might notice some irritation and congestion in your nose or some drainage.  This is from the oxygen used during your procedure.  There is no need for concern and it should clear up in a day or so.  SYMPTOMS TO REPORT IMMEDIATELY:   Following lower endoscopy (colonoscopy or flexible sigmoidoscopy):  Excessive amounts of blood in the stool  Significant tenderness or worsening of abdominal pains  Swelling of the abdomen that is new, acute  Fever of 100F or higher   For urgent or emergent issues, a gastroenterologist can be reached at any hour by calling [phone]. Do not use MyChart messaging for urgent concerns.    DIET:  We do recommend a small meal at first, but then you may proceed to your regular diet.  Drink plenty of fluids but you should avoid alcoholic beverages for 24 hours.  Try to  increase the fiber in your diet, and drink plenty of water.  ACTIVITY:  You should plan to take it easy for the rest of today and you should NOT DRIVE or use heavy machinery until tomorrow (because of the sedation medicines used during the test).    FOLLOW UP: Our staff will call the number listed on your records 48-72 hours following your procedure to check on you and address any questions or concerns that you may have regarding the information given to you following your procedure. If we do not reach you, we will leave a message.  We will attempt to reach you two times.  During this call, we will ask if you have developed any symptoms of COVID 19. If you develop any symptoms (ie: fever, flu-like symptoms, shortness of breath, cough etc.) before then, please call [phone].  If you test positive for Covid 19 in the 2 weeks post procedure, please call and report this information to [name].    If any biopsies were taken you will be contacted by phone or by letter within the next 1-3 weeks.  Please call us at [phone] if you have not heard about the biopsies in 3 weeks.    SIGNATURES/CONFIDENTIALITY: You and/or your care partner have signed paperwork which will be entered into your electronic medical record.  These signatures attest to the fact that that the information above on your After Visit Summary has been  reviewed and is understood.  Full responsibility of the confidentiality of this discharge information lies with you and/or your care-partner.

## 2019-05-08 NOTE — Progress Notes (Signed)
Report given to PACU, vss 

## 2019-05-08 NOTE — Progress Notes (Signed)
[facility]  Patient Name: Leslie Douglas MRN: [id number] Date of Birth: 1965-08-09 Age: 54 y.o. Education: 14 years  Measurement properties of test scores: IQ, Index, and Standard Scores (SS): Mean = 100; Standard Deviation = 15 Scaled Scores (Ss): Mean = 10; Standard Deviation = 3 Z scores (Z): Mean = 0; Standard Deviation = 1 T scores (T); Mean = 50; Standard Deviation = 10  TEST SCORES:    Note: This summary of test scores accompanies the interpretive report and should not be considered in isolation without reference to the appropriate sections in the text. Test scores are relative to age, gender, and educational history as available and appropriate.   Performance Validity        ACS: Raw Descriptor      Word Choice: 50 Within Expectation      The Dot Counting Test: Raw Descriptor      E-Score 12 Within Expectation      Embedded Measures: Raw Descriptor      RBANS Effort Index: 3 Within Expectation      WAIS-IV Reliable Digit Span: 9 Within Expectation      WAIS-IV Reliable Digit Span Revised 12 Within Expectation      Expected Functioning        Wide Range Achievement Test (Word Reading): Standard/Scaled Score Percentile       Word Reading 109 73      Cognitive Testing        RBANS, Form : Standard/Scaled Score Percentile  Total Score 70 2  Immediate Memory 61 <1      List Learning 2 <1      Story Memory 5 5  Visuospatial/Constructional 96 39      Figure Copy   (18) 10 50      Line Orientation --- 26-50  Language 84 14      Picture Naming --- 51-75      Semantic Fluency 5 5  Attention 79 8      Digit Span 10 50      Coding 3 1  Delayed Memory 64 1      List Recall   (3) --- 3-9      List Recognition   (14) --- <2      Story Recall   (5) 5 5      Figure Recall   (9) 6 9      Wechsler Adult Intelligence Scale - IV: Standard/Scaled Score Percentile  Working Memory Index 86 18      Digit Span 8 25          Digit  Span Forward 10 50          Digit Span Backward 8 25          Digit Span Sequencing 6 9      Arithmetic 7 16  Processing Speed Index 79 8      Symbol Search 7 16      Coding 5 5      Verbal Fluency: T-score Percentile      Controlled Oral Word Association (F-A-S) 36 8      Semantic Fluency (Animals) 31 3      Trail Making Test: T-Score Percentile      Part A 34 5      Part B 31 3      Wisconsin Card Sorting Test - 64: T-score Percentile      Categories --- 6-10%  Total Errors 37 10      Perseverative Errors 42 21      Nonperseverative Errors 33 4      Boston Diagnostic Aphasia Exam: Raw Score Scaled Score      Complex Ideational Material 11 9      Rating Scales         Raw Score Descriptor  Patient Health Questionnaire - 9 18 Severe Depression  GAD-7 17 Severe Anxiety   Peter V. Nicole Kindred PsyD, [place] Clinical Neuropsychologist

## 2019-05-08 NOTE — Op Note (Signed)
[place] Patient Name: Leslie Douglas Procedure Date: 05/08/2019 9:35 AM MRN: [id number] Endoscopist: Justice Britain , MD Age: 54 Referring MD:  Date of Birth: 1965-10-04 Gender: Female Account #: [id number] Procedure:                Colonoscopy Indications:              History of Chronic ulcerative pancolitis, Assess                            therapeutic response to therapy of chronic                            ulcerative pancolitis Medicines:                Monitored Anesthesia Care Procedure:                Pre-Anesthesia Assessment:                           - Prior to the procedure, a History and Physical                            was performed, and patient medications and                            allergies were reviewed. The patient's tolerance of                            previous anesthesia was also reviewed. The risks                            and benefits of the procedure and the sedation                            options and risks were discussed with the patient.                            All questions were answered, and informed consent                            was obtained. Prior Anticoagulants: The patient has                            taken no previous anticoagulant or antiplatelet                            agents. ASA Grade Assessment: III - A patient with                            severe systemic disease. After reviewing the risks                            and benefits, the patient was deemed in  satisfactory condition to undergo the procedure.                           After obtaining informed consent, the colonoscope                            was passed under direct vision. Throughout the                            procedure, the patient's blood pressure, pulse, and                            oxygen saturations were monitored continuously. The                            Colonoscope was introduced through the  anus and                            advanced to the 4 cm into the ileum. The                            colonoscopy was performed without difficulty. The                            patient tolerated the procedure. The quality of the                            bowel preparation was good. The terminal ileum,                            ileocecal valve, appendiceal orifice, and rectum                            were photographed. Scope In: 9:41:06 AM Scope Out: 10:02:51 AM Scope Withdrawal Time: 0 hours 16 minutes 46 seconds  Total Procedure Duration: 0 hours 21 minutes 45 seconds  Findings:                 The digital rectal exam findings include                            hemorrhoids. Pertinent negatives include no                            palpable rectal lesions.                           The terminal ileum and ileocecal valve appeared                            normal. Biopsies were taken with a cold forceps for                            histology.                             Normal mucosa was found in the entire colon.                            Biopsies were taken with a cold forceps for                            histology from the right colon every 10 cm.                            Biopsies were taken with a cold forceps for                            histology from the left colon every 10 cm. Biopsies                            were taken with a cold forceps for histology in the                            rectum.                           Non-bleeding non-thrombosed external and internal                            hemorrhoids were found during retroflexion, during                            perianal exam and during digital exam. The                            hemorrhoids were Grade II (internal hemorrhoids                            that prolapse but reduce spontaneously). Complications:            No immediate complications. Estimated Blood Loss:     Estimated blood loss was  minimal. Impression:               - Hemorrhoids found on digital rectal exam.                           - The examined portion of the ileum was normal.                            Biopsied.                           - Normal mucosa in the entire examined colon.                            Biopsied for IBD surveillance.                           - Non-bleeding non-thrombosed external and internal                              hemorrhoids. Recommendation:           - The patient will be observed post-procedure,                            until all discharge criteria are met.                           - Discharge patient to home.                           - Patient has a contact number available for                            emergencies. The signs and symptoms of potential                            delayed complications were discussed with the                            patient. Return to normal activities tomorrow.                            Written discharge instructions were provided to the                            patient.                           - High fiber diet.                           - Continue present medications.                           - Await pathology results.                           - Repeat colonoscopy in likely 2 years for                            surveillance based on pathology results for IBD                            surveillance, unless clinical changes occur such                            that concern for flare is present.                           - The findings and recommendations were discussed                            with the patient. Justice Britain, MD 05/08/2019 10:09:09 AM

## 2019-05-08 NOTE — Progress Notes (Signed)
Called to room to assist during endoscopic procedure.  Patient ID and intended procedure confirmed with present staff. Received instructions for my participation in the procedure from the performing physician.  

## 2019-05-10 ENCOUNTER — Telehealth: Payer: Self-pay

## 2019-05-10 ENCOUNTER — Other Ambulatory Visit: Payer: Self-pay

## 2019-05-10 ENCOUNTER — Encounter: Payer: Self-pay | Admitting: Counselor

## 2019-05-10 ENCOUNTER — Ambulatory Visit (INDEPENDENT_AMBULATORY_CARE_PROVIDER_SITE_OTHER): Payer: BC Managed Care – PPO | Admitting: Counselor

## 2019-05-10 DIAGNOSIS — F09 Unspecified mental disorder due to known physiological condition: Secondary | ICD-10-CM | POA: Diagnosis not present

## 2019-05-10 DIAGNOSIS — F603 Borderline personality disorder: Secondary | ICD-10-CM | POA: Diagnosis not present

## 2019-05-10 NOTE — Progress Notes (Signed)
[facility]  I met with Leslie Douglas to review the findings resulting from her neuropsychological evaluation. Since the last appointment, things have been about the same. She continues to have a stressful time at work, too much is asked of her, and her management actually divided some of her responsibilities because they realized she was being pulled "in too many directions." Time was spent reviewing the impressions and recommendations that are detailed in the evaluation report. Areas of emphasis included impression of some mild cognitive diminishment although given nonspecific pattern, preserved MRI, demographics, I think it's more likely her issues are on the basis of psychiatric factors, sleep, medication side effects and the like than any progressive condition. We talked at length about sleep hygiene recommendations, avoiding overfocus on cognition, and I provided psychoeducation on side effects of anticholinergic medications. Interventions provided during this encounter included psychoeducation, empathic reflection. I took time to explain the findings and answer all the patient's questions. I encouraged [name] to contact me should she have any further questions or if further follow up is desired.   Current Medications and Medical History   Current Outpatient Medications  Medication Sig Dispense Refill  . amitriptyline (ELAVIL) 150 MG tablet Take 1 tablet by mouth daily.    . ARIPiprazole (ABILIFY) 5 MG tablet Take 5 mg by mouth daily.    . Biotin w/ Vitamins C & E (HAIR/SKIN/NAILS PO) Take by mouth.    . cetirizine (ZYRTEC) 10 MG tablet Take 5 mg by mouth daily.    . cholecalciferol (VITAMIN D3) 25 MCG (1000 UT) tablet Take 1,000 Units by mouth daily.    Marland Kitchen EPINEPHrine (EPIPEN 2-PAK) 0.3 mg/0.3 mL IJ SOAJ injection Inject 0.3 mLs (0.3 mg total) into the muscle as needed for anaphylaxis. 1 each 1  . fluticasone (FLONASE) 50 MCG/ACT nasal spray Place  2 sprays into both nostrils daily. 32 g 16  . furosemide (LASIX) 20 MG tablet Take 1 tablet (20 mg total) by mouth daily as needed for fluid. 30 tablet 0  . Insulin Pen Needle (BD PEN NEEDLE NANO 2ND GEN) 32G X 4 MM MISC 1 Package by Does not apply route daily. 100 each 0  . lamoTRIgine (LAMICTAL) 100 MG tablet Take 100 mg by mouth daily.    . Liraglutide -Weight Management (SAXENDA) 18 MG/3ML SOPN Inject 0.5 mLs (3 mg total) into the skin daily. 1 pen 0  . mesalamine (LIALDA) 1.2 g EC tablet Take 4 tablets (4.8 g total) by mouth daily with breakfast. 60 tablet 3  . Multiple Vitamin (MULTIVITAMIN) tablet Take 1 tablet by mouth daily.    . ondansetron (ZOFRAN ODT) 8 MG disintegrating tablet Take 1 tablet (8 mg total) by mouth every 8 (eight) hours as needed for nausea or vomiting. 20 tablet 4  . potassium chloride SA (KLOR-CON) 20 MEQ tablet Take 1 tablet (20 mEq total) by mouth daily as needed. 30 tablet 0  . propranolol (INDERAL) 10 MG tablet Take 1 tablet by mouth 3 (three) times daily as needed.    . Vitamin D, Ergocalciferol, (DRISDOL) 1.25 MG (50000 UNIT) CAPS capsule Take 1 capsule (50,000 Units total) by mouth every 7 (seven) days. 4 capsule 0   No current facility-administered medications for this visit.    Patient Active Problem List   Diagnosis Date Noted  . Fatty liver 04/08/2019  . Encounter for other preprocedural examination 04/08/2019  . Borderline personality disorder ([place]) 03/27/2019  . Chronic colitis 11/25/2018  .  Abnormal LFTs 10/14/2018  . Positive Anti-Smooth Muscle antibody  10/04/2018  . History of allergy to aspirin 09/09/2018  . Anaphylaxis 05/14/2018  . Nausea and vomiting 05/13/2018  . Abnormal colonoscopy 04/04/2018  . Chronic diarrhea 04/04/2018  . Ulcerative pancolitis without complication ([place]) 11/13/1115  . IBD (inflammatory bowel disease) 04/04/2018  . History of cholecystectomy 04/04/2018  . LUQ pain 04/04/2018  . Basal cell carcinoma 10/20/2017  .  Anxiety with depression 10/20/2017  . Binge eating disorder 10/20/2017    Mental Status and Behavioral Observations  Ms. Tolson was available at the prespecified time for this telephonic appointment. She was alert and fully oriented. Self-reported mood was "good" and affect was euthymic to neutral with an undercurrent of stress that she seems to be handling adequately. Thought process was logical, linear, and goal oriented. There were no safety concerns at today's visit.   Plan  Feedback provided regarding the patient's neuropsychological evaluation. We discussed likely contributing factors including BPD, changes at work with resultant stress and decompensation, medication side effects, and sleep. Details in patient instructions. She will discuss the elavil with Dr. Sylvester Harder. Leslie Douglas was encouraged to contact me if any questions arise or if further follow up is desired.   Viviano Simas Nicole Kindred, PsyD, ABN Clinical Neuropsychologist  Service(s) Provided at This Encounter: 30 minutes [phone]; Psychotherapy with patient/family)

## 2019-05-10 NOTE — Progress Notes (Signed)
[facility]  Patient Name: Leslie Douglas MRN: [id number] Date of Birth: November 08, 1965 Age: 54 y.o. Education: 14 years  Clinical Impressions  Leslie Douglas is a 54 y.o., right-hand dominant, separated woman with a history of borderline personality disorder and memory problems over the past 16 months. She has been under increased stress at work related to the pandemic and had to take a leave of absence for 6 weeks, returning on March 8th. Her day-to-day symptoms include difficulties taking notes in therapy skills classes, problems switching between computer screens at work, and she had one concerning incident where she woke up at [place], thought she was late to work and called her boss, but then realized it was 12 in the morning (not the afternoon). She is still functioning adequately with finances, medications, scheduling medical appointments, and at work although her efficiency is diminished.   Neuropsychological test findings show marginal performance validity, tempering expectations for the patient's cognitive test scores. There is a nonspecific pattern of mild diminishment in most areas including on measures of memory encoding and delayed recall, processing speed, verbal fluency, and executive function. She does not appear frankly amnestic and did retain much of the information that she learned across time. She did well on measures of visuospatial and constructional functioning, naming, and digit repetition forward. She reported severe levels of symptoms associated with depression and anxiety and that her symptoms have made life "extremely difficult" over the past two weeks.   Overall impression is one of mild cognitive diminishment in a nonspecific pattern, which could be related to medication side effects, psychiatric illness, poor sleep, or more likely some combination thereof. Neurodegeneration or other progressive etiology viewed as unlikely given the  nonspecific pattern of findings and demographics.   Diagnostic Impressions: Mild Cognitive Disorder Borderline Personality Disorder (by history)  Binge Eating Disorder (by history)  Recommendations to be discussed with patient  Your performance on neuropsychological testing suggested some marginal validity findings, meaning that it reduces the confidence that we can place in the test data. Apart from that you showed mildly diminished performance in most areas. The pattern of difficulties that you had is nonspecific and involved things like processing speed and efficiency, memory encoding, working memory, and executive function. Due to your demographics and the nonspecific pattern of findings, I think it is unlikely that you have a progressive cause for your cognitive problems, such as Alzheimer's disease. I think it is more likely that they are due to a combination of reversible factors including poor sleep, medication side effects, significant stress, and psychiatric difficulties.   You should know that many of the medications you are on for sleep and mood can have cognitively interfering side effects, including Abilify and Elevil. There is a cost-benefit analysis that you must undertake with your prescribing providers, because obviously you are on these medications for a reason and the symptoms that they are helping to control can also be a source of cognitive interference. I would recommend that your prescribing providers attempt, to the extent possible, to minimize the use of brain impairing medications.   You reported very high levels of symptoms associated with depression and anxiety. You are already following with a psychiatrist and are in [place] skills treatment, which I highly recommend you continue.   For sleep, I recommend against using medications, which can have lingering sedating effects on the brain and rob your brain of restful REM sleep. Instead, consider trying some of the following  sleep  hygiene recommendations. They may not work at once and may take effort, but the effort you spend is likely to be rewarded with better sleep eventually:  . Stick to a sleep schedule of the same bedtime and wake time even on the weekends, which can help to regulate your body's internal clock so that you fall asleep and stay asleep.  . Practice a relaxing bedtime ritual (conducted away from bright lights) which will help separate your sleep from stimulating activities and prepare your body to fall asleep when you go to bed.  . Avoid naps, especially in the afternoon.  . Evaluate your room and create conditions that will promote sleep such as keeping it cool (between 60 - 67 degrees), quiet, and free from any lights. Consider using blackout curtains, a "white noise" generator, or fan that will help mask any noises that might prevent you from going to sleep or awaken you during the night.  . Sleep on a comfortable mattress and pillows.  . Avoid bright light in the evening and excessive use of portable electronic devices right before bed that may contain light frequencies that can contribute to sleep problems.  . Avoid alcohol, cigarettes, or heavy meals in the evening. If you must eat, consume a light snack 45 minutes before bed.  . Use your bed only for sleep to strengthen the association between your bed and sleep.  . If you can't go to sleep within 30 minutes, go into another room and do something relaxing until you feel tired. Then, come back and try to go to sleep again for 30 minutes and repeat until sleep is achieved.  . Some people find over the counter melatonin to be helpful for sleep, which you could discuss with a pharmacist or prescribing provider.   Avoid overfocusing on cognitive performance. Memory and cognition are notoriously fallible and if you are looking for cognitive problems, you are bound to find them. Once someone gets worried about their memory and thinking, they may overfocus  on how they are doing day-to-day, and then when normal day-to-day cognitive errors are made, this becomes a cause for more concern. This concern and anxiety then decreases focus from the task at hand, reducing concentration, causing more cognitive problems, and creating a vicious cycle. Rather than critiquing your performance, I would encourage you to remain present minded and focus on the task at hand. Perhaps most importantly, have reasonable expectations for yourself.  Healthy people forget things, lose focus, and do not perform 100% correctly all the time. Some cognitive errors are normal and are not necessarily a sign that there is something wrong with your brain.   Above all, I would recommend that you try to cultivate an attitude of mindfulness and focus when you are applying yourself to a task requiring performance, such as work. To the extent that you are distracted by internal or external factors, you are more likely to make errors and to have difficulties. Try to be an observer, stay present minded, remind yourself that the things on your mind can wait until after the task is done. Attempt to create an environment conducive to focus and work such as by removing distractions.   As a general recommendation, I would suggest that you engage in health lifestyle behaviors including getting recommended (usually 8 hours) amounts of sleep, eating a healthy diet rich in unprocessed foods such as vegetables and whole grains, assertively monitor and manage underlying medical conditions that can contribute to cognitive impairment (e.g., hypertension, high  cholesterol, etc.), getting regular exercise (preferably 20-30 minutes a day), and engaging in satisfying social interactions with others.   Test Findings  Test scores are summarized in additional documentation associated with this encounter. Test scores are relative to age, gender, and educational history as available and appropriate. There were minor  concerns about performance validity with one embedded measure at the very margin of expectations. Ms. Italiano was noted to have a somewhat slow test-taking tempo that did not respond when she was asked to hurry and I wonder if that didn't affect her scores. Nonetheless, the data do not appear to be frankly invalid and she did perform within expectations on several standalone and embedded measures.   General Intellectual Functioning/Achievement:  Performance was toward the upper most extreme of the average range on single word reading, suggesting that average to high average is a reasonable comparison for her cognitive test data.   Attention and Processing Efficiency: Performance on indicators of working memory was within gross limits of normal if not a bit marginal with a low average score on the Working Memory Index of the WAIS-IV. With respect to auditory attention, digit repetition forward and backward were average whereas digit resequencing in ascending order was unusually low. Solving of arithmetical word problems without paper and pencil was low average.   Performance was low on most measures of processing speed with an unusually low score overall on the WAIS-IV Processing Speed Index. Low average performance was observed on a measure of efficient visual scanning and efficient visual matching. By contrast, she achieved unusually to extremely low scores on two different measures of timed number-symbol coding. Simple numeric sequencing was also unusually low.    Language: Language findings suggested intact visual object confrontation naming and diminished verbal fluency, some of which is likely to reduced cognitive efficiency. Semantic and phonemic fluency were both unusually low.   Visuospatial Function: Performance within this domain represented an area of strength for Ms. Wah with an average score on the visuospatial/constructional index of the RBANS. Copy of a line drawing and judgment of  angular line orientations were both average.   Learning and Memory: Weak scores were obtained on all measures of learning and delayed recall. Despite the performance level, Ms. Neyra did remember much of the information she encoded across time and as such, does not appear to have a frank storage difficulty.   In the verbal realm, immediate recall for a 10-item word list was extremely low and memory for a short story was unusually low. Following a standard delay, delayed recall for the words from the list was unusually low and delayed recall for the short story was unusually low. Yes/no recognition of the words from the list versus foils was extremely low, which is a finding of uncertain significance in the context of better free recall performance, and may reflect some indecisiveness.   In the visual realm, delayed recall of a modestly complex figure was unusually low.   Executive Functions: Executive findings generated mainly weak scores with unusually low scores on most indicators. Generation of words in response to the letters F-A-S and alternating sequencing of numbers and letters of the alphabet were low average. Categories completed fell on the margin of the low average and unusually low ranges as did total errors and nonperseverative on the [employer]. Perspeverative errors was low average.  Rating Scale(s): Ms. How reported severe levels of symptoms associated with anxiety and depression on self-rating scales, including feeling bothered by thoughts that she  is a failure or has let herself or her family down, trouble concentrating, feeling tired/having little energy, and little interest or pleasure in doing things every day.  Viviano Simas Nicole Kindred PsyD, [place] Clinical Neuropsychologist

## 2019-05-10 NOTE — Telephone Encounter (Signed)
  Follow up Call-  Call back number 05/08/2019 02/21/2018  Post procedure Call Back phone  # [phone]  Permission to leave phone message Yes Yes  Some recent data might be hidden     Patient questions:  Do you have a fever, pain , or abdominal swelling? No. Pain Score  0 *  Have you tolerated food without any problems? Yes.    Have you been able to return to your normal activities? Yes.    Do you have any questions about your discharge instructions: Diet   No. Medications  No. Follow up visit  No.  Do you have questions or concerns about your Care? No.  Actions: * If pain score is 4 or above: No action needed, pain <4. 1. Have you developed a fever since your procedure? no  2.   Have you had an respiratory symptoms (SOB or cough) since your procedure? no  3.   Have you tested positive for COVID 19 since your procedure no  4.   Have you had any family members/close contacts diagnosed with the COVID 19 since your procedure?  no   If yes to any of these questions please route to Joylene John, RN and Erenest Rasher, RN

## 2019-05-11 ENCOUNTER — Encounter: Payer: Self-pay | Admitting: Gastroenterology

## 2019-05-21 ENCOUNTER — Encounter (INDEPENDENT_AMBULATORY_CARE_PROVIDER_SITE_OTHER): Payer: Self-pay | Admitting: Family Medicine

## 2019-05-21 ENCOUNTER — Ambulatory Visit (INDEPENDENT_AMBULATORY_CARE_PROVIDER_SITE_OTHER): Payer: BC Managed Care – PPO | Admitting: Family Medicine

## 2019-05-21 ENCOUNTER — Other Ambulatory Visit: Payer: Self-pay

## 2019-05-21 VITALS — BP 126/81 | HR 97 | Temp 98.2°F | Ht 67.0 in | Wt 294.0 lb

## 2019-05-21 DIAGNOSIS — E8881 Metabolic syndrome: Secondary | ICD-10-CM | POA: Diagnosis not present

## 2019-05-21 DIAGNOSIS — E559 Vitamin D deficiency, unspecified: Secondary | ICD-10-CM

## 2019-05-21 DIAGNOSIS — Z6841 Body Mass Index (BMI) 40.0 and over, adult: Secondary | ICD-10-CM

## 2019-05-21 DIAGNOSIS — Z9189 Other specified personal risk factors, not elsewhere classified: Secondary | ICD-10-CM

## 2019-05-21 DIAGNOSIS — F5081 Binge eating disorder: Secondary | ICD-10-CM | POA: Diagnosis not present

## 2019-05-21 MED ORDER — VITAMIN D (ERGOCALCIFEROL) 1.25 MG (50000 UNIT) PO CAPS: 50000.0000 [IU] | ORAL_CAPSULE | ORAL | 0 refills | Status: DC

## 2019-05-21 MED ORDER — SAXENDA 18 MG/3ML ~~LOC~~ SOPN: 3.0000 mg | PEN_INJECTOR | Freq: Every day | SUBCUTANEOUS | 0 refills | Status: DC

## 2019-05-21 MED ORDER — BD PEN NEEDLE NANO 2ND GEN 32G X 4 MM MISC: 1.0000 | Freq: Every day | 0 refills | Status: DC

## 2019-05-21 NOTE — Progress Notes (Signed)
Chief Complaint:   OBESITY Leslie Douglas is here to discuss her progress with her obesity treatment plan along with follow-up of her obesity related diagnoses. Leslie Douglas is not currently following an eating plan. Leslie Douglas states she is exercising for 0 minutes 0 times per week.  Today's visit was #: 3 Starting weight: 297 lbs Starting date: 04/12/2019 Today's weight: 294 lbs Today's date: 05/21/2019 Total lbs lost to date: 3 lbs Total lbs lost since last in-office visit: 2 lbs  Interim History: Leslie Douglas mistakenly took Saxenda 3 mg for her first dose last week.  She also had her second COVID vaccine that day.  She has felt terrible all week.  She says she is ready to retry Saxenda.  Subjective:   1. Insulin resistance Leslie Douglas has a diagnosis of insulin resistance based on her elevated fasting insulin level >5. She continues to work on diet and exercise to decrease her risk of diabetes.    Lab Results  Component Value Date   INSULIN 18.8 04/12/2019   Lab Results  Component Value Date   HGBA1C 5.5 10/20/2017   2. Vitamin D deficiency Leslie Douglas's Vitamin D level was 24.6 on 04/12/2019. She is currently taking prescription vitamin D 50,000 IU each week. She denies nausea, vomiting or muscle weakness.  3. Binge eating disorder She is followed by Dr. Daron Offer.  She has journaled over the past 2 weeks.  4. At risk for constipation Greenly is at increased risk for constipation due to inadequate water intake, changes in diet, and/or use of medications such as GLP1 agonists. Leslie Douglas denies hard, infrequent stools currently.   Assessment/Plan:   1. Insulin resistance Leslie Douglas will continue to work on weight loss, exercise, and decreasing simple carbohydrates to help decrease the risk of diabetes. Leslie Douglas agreed to follow-up with Leslie Douglas as directed to closely monitor her progress.  2. Vitamin D deficiency Low Vitamin D level contributes to fatigue and are associated with obesity, breast, and colon  cancer. She agrees to continue to take prescription Vitamin D @50 ,000 IU every week and will follow-up for routine testing of Vitamin D, at least 2-3 times per year to avoid over-replacement.   Orders - Vitamin D, Ergocalciferol, (DRISDOL) 1.25 MG (50000 UNIT) CAPS capsule; Take 1 capsule (50,000 Units total) by mouth every 7 (seven) days.  Dispense: 4 capsule; Refill: 0  3. Binge eating disorder Will continue to monitor.   Counseling Eat only in one specified room of the house, such as the dining room. Limit all of your eating, even snacks, to that one room. Identify a similar place at work. Slow down your eating. Give your stomach a chance to register that it is full. Put your fork, knife or spoon down between bites of food. Serve foods in the desired portions directly on your plate. Do not use serving bowls at the dinner table. Avoid second helpings by putting extra food away before you sit down for at the meal. Do not eat while you are involved in other activities. Avoid sitting in front of the television or reading a book while you are eating. When you eat, eating should be the main focus of your attention.  4. At risk for constipation Leslie Douglas was given approximately 15 minutes of counseling today regarding prevention of constipation. She was encouraged to increase water and fiber intake.   5. Class 3 severe obesity with serious comorbidity and body mass index (BMI) of 45.0 to 49.9 in adult, unspecified obesity type ([place])  Orders - Liraglutide -Weight  Management (SAXENDA) 18 MG/3ML SOPN; Inject 0.5 mLs (3 mg total) into the skin daily.  Dispense: 1 pen; Refill: 0 - Insulin Pen Needle (BD PEN NEEDLE NANO 2ND GEN) 32G X 4 MM MISC; 1 Package by Does not apply route daily.  Dispense: 100 each; Refill: 0  Leslie Douglas is currently in the action stage of change. As such, her goal is to continue with weight loss efforts. She has agreed to practicing portion control and making smarter food choices, such  as increasing vegetables and decreasing simple carbohydrates.   Exercise goals: All adults should avoid inactivity. Some physical activity is better than none, and adults who participate in any amount of physical activity gain some health benefits.  Behavioral modification strategies: emotional eating strategies.  Leslie Douglas has agreed to follow-up with our clinic in 2 weeks. She was informed of the importance of frequent follow-up visits to maximize her success with intensive lifestyle modifications for her multiple health conditions.   Objective:   Blood pressure 126/81, pulse 97, temperature 98.2 F (36.8 C), temperature source Oral, height 5' 7"  (1.702 m), weight 294 lb (133.4 kg), SpO2 99 %. Body mass index is 46.05 kg/m.  General: Cooperative, alert, well developed, in no acute distress. HEENT: Conjunctivae and lids unremarkable. Cardiovascular: Regular rhythm.  Lungs: Normal work of breathing. Neurologic: No focal deficits.   Lab Results  Component Value Date   CREATININE 0.82 03/27/2019   BUN 15 03/27/2019   NA 138 03/27/2019   K 4.0 03/27/2019   CL 102 03/27/2019   CO2 29 03/27/2019   Lab Results  Component Value Date   ALT 41 (H) 03/27/2019   AST 30 03/27/2019   ALKPHOS 63 03/27/2019   BILITOT 0.4 03/27/2019   Lab Results  Component Value Date   HGBA1C 5.5 10/20/2017   Lab Results  Component Value Date   INSULIN 18.8 04/12/2019   Lab Results  Component Value Date   TSH 2.32 03/27/2019   Lab Results  Component Value Date   CHOL 224 (H) 10/20/2017   HDL 53 10/20/2017   LDLCALC 142 (H) 10/20/2017   TRIG 147 10/20/2017   CHOLHDL 4.2 10/20/2017   Lab Results  Component Value Date   WBC 5.0 03/27/2019   HGB 12.8 03/27/2019   HCT 38.3 03/27/2019   MCV 92.5 03/27/2019   PLT 347.0 03/27/2019   Lab Results  Component Value Date   IRON 89 09/21/2018   TIBC 346 09/21/2018   FERRITIN 90.3 09/21/2018   Attestation Statements:   Reviewed by clinician  on day of visit: allergies, medications, problem list, medical history, surgical history, family history, social history, and previous encounter notes.  I, Water quality scientist, CMA, am acting as Location manager for [employer], DO.  I have reviewed the above documentation for accuracy and completeness, and I agree with the above. Briscoe Deutscher, DO

## 2019-05-23 ENCOUNTER — Encounter (INDEPENDENT_AMBULATORY_CARE_PROVIDER_SITE_OTHER): Payer: Self-pay | Admitting: *Deleted

## 2019-05-24 ENCOUNTER — Ambulatory Visit: Payer: BC Managed Care – PPO | Admitting: Gastroenterology

## 2019-05-24 ENCOUNTER — Encounter: Payer: Self-pay | Admitting: Gastroenterology

## 2019-05-24 ENCOUNTER — Other Ambulatory Visit (INDEPENDENT_AMBULATORY_CARE_PROVIDER_SITE_OTHER): Payer: BC Managed Care – PPO

## 2019-05-24 VITALS — BP 110/70 | HR 96 | Temp 98.1°F | Ht 67.0 in | Wt 300.4 lb

## 2019-05-24 DIAGNOSIS — K51019 Ulcerative (chronic) pancolitis with unspecified complications: Secondary | ICD-10-CM

## 2019-05-24 DIAGNOSIS — K760 Fatty (change of) liver, not elsewhere classified: Secondary | ICD-10-CM

## 2019-05-24 DIAGNOSIS — R152 Fecal urgency: Secondary | ICD-10-CM | POA: Diagnosis not present

## 2019-05-24 DIAGNOSIS — K529 Noninfective gastroenteritis and colitis, unspecified: Secondary | ICD-10-CM | POA: Diagnosis not present

## 2019-05-24 DIAGNOSIS — K5100 Ulcerative (chronic) pancolitis without complications: Secondary | ICD-10-CM | POA: Diagnosis not present

## 2019-05-24 LAB — COMPREHENSIVE METABOLIC PANEL
ALT: 39 U/L — ABNORMAL HIGH (ref 0–35)
AST: 28 U/L (ref 0–37)
Albumin: 4.3 g/dL (ref 3.5–5.2)
Alkaline Phosphatase: 67 U/L (ref 39–117)
BUN: 16 mg/dL (ref 6–23)
CO2: 32 mEq/L (ref 19–32)
Calcium: 9.5 mg/dL (ref 8.4–10.5)
Chloride: 102 mEq/L (ref 96–112)
Creatinine, Ser: 0.85 mg/dL (ref 0.40–1.20)
GFR: 69.19 mL/min (ref >60.00)
Glucose, Bld: 106 mg/dL — ABNORMAL HIGH (ref 70–99)
Potassium: 4.1 mEq/L (ref 3.5–5.1)
Sodium: 139 mEq/L (ref 135–145)
Total Bilirubin: 0.3 mg/dL (ref 0.2–1.2)
Total Protein: 7.2 g/dL (ref 6.0–8.3)

## 2019-05-24 LAB — SEDIMENTATION RATE: Sed Rate: 13 mm/hr (ref 0–30)

## 2019-05-24 LAB — CBC
HCT: 38.3 % (ref 36.0–46.0)
Hemoglobin: 12.9 g/dL (ref 12.0–15.0)
MCHC: 33.6 g/dL (ref 30.0–36.0)
MCV: 91.1 fl (ref 78.0–100.0)
Platelets: 383 10*3/uL (ref 150.0–400.0)
RBC: 4.2 Mil/uL (ref 3.87–5.11)
RDW: 13.1 % (ref 11.5–15.5)
WBC: 7.3 10*3/uL (ref 4.0–10.5)

## 2019-05-24 LAB — HIGH SENSITIVITY CRP: CRP, High Sensitivity: 2.57 mg/L (ref 0.000–5.000)

## 2019-05-24 MED ORDER — MESALAMINE 1000 MG RE SUPP
1000.0000 mg | Freq: Every day | RECTAL | 1 refills | Status: DC
Start: 2019-05-24 — End: 2019-10-23

## 2019-05-24 NOTE — Progress Notes (Signed)
[facility]IS[facility]   Primary Care Provider Inda Coke, [place] [address] [place] [address] [phone]  Patient Profile: Leslie Douglas is a 54 y.o. female with a pmh significant for hx of BCC of the skin, Anxiety/MDD, COPD, Unclear Anaphylactoid reactions, Ulcerative pancolitis (diagnosed 2020 as etiology for her chronic diarrhea).  The patient presents to the [facility] for an evaluation and management of problem(s) noted below:  Problem List 1. Ulcerative chronic pancolitis without complications ([place])   2. Fecal urgency   3. Chronic diarrhea   4. Fatty liver     History of Present Illness: Please see initial consultation note and progress notes for full details of HPI.  Interval History Today, the patient returns for scheduled follow-up.  Since her last clinic visit we performed a colonoscopy with results that suggested normal endoscopic appearance of her entire colon.  Biopsies of her right colon showed no active or chronic disease and biopsies of her left colon and rectum showed improving minimal chronic activity.  She remains on Lialda 4.8 g daily.  She definitely now knows the difference over the course of 6 months where she was before initiation of treatment as to where she is now.  However things continue to be an issue for her in regards to the number of bowel movements that she has as well as fecal urgency.  Work stress seems to be slightly better than when I had seen her in clinic last time as well as that her colonoscopy visit.  No blood in her stools.  IBD diary is as below: BMs per day -3-5 Nocturnal BMs -none Blood -none Mucous -periodically Ten[place]mus - Denies Urgency -at least 50% of the time Incomplete evaluation -periodically Skin Manifestations - no new skin changes Eye Manifestations - no new vision change Joint Manifestations - unchanged joint discomfort  The patient is initiating liraglutide for  attempt at weight loss under the direction of a physician.  She is hopeful that this will help with her obesity and help her stay as healthy as she can.  GI Review of Systems Positive as above Negative for pain, nausea, vomiting, dysphagia, odynophagia  Review of Systems General: Denies fevers/chills/unintentional weight loss HEENT: Denies oral lesions Cardiovascular: Denies chest pain  Pulmonary: Denies shortness of breath Gastroenterological: See HPI Genitourinary: Denies darkened urine Hematological: Denies easy bruising Dermatological: As per HPI Psychological: Mood is stable Allergy & Immunology: No new allergies Musculoskeletal: No new significant arthralgias   Medications Current Outpatient Medications  Medication Sig Dispense Refill  . amitriptyline (ELAVIL) 150 MG tablet Take 1 tablet by mouth daily.    . ARIPiprazole (ABILIFY) 5 MG tablet Take 5 mg by mouth daily.    . Biotin w/ Vitamins C & E (HAIR/SKIN/NAILS PO) Take by mouth.    . cetirizine (ZYRTEC) 10 MG tablet Take 5 mg by mouth daily.    . cholecalciferol (VITAMIN D3) 25 MCG (1000 UT) tablet Take 1,000 Units by mouth daily.    Marland Kitchen EPINEPHrine (EPIPEN 2-PAK) 0.3 mg/0.3 mL IJ SOAJ injection Inject 0.3 mLs (0.3 mg total) into the muscle as needed for anaphylaxis. 1 each 1  . fluticasone (FLONASE) 50 MCG/ACT nasal spray Place 2 sprays into both nostrils daily. 32 g 16  . furosemide (LASIX) 20 MG tablet Take 1 tablet (20 mg total) by mouth daily as needed for fluid. 30 tablet 0  . Insulin Pen Needle (BD PEN NEEDLE NANO 2ND GEN) 32G X 4 MM MISC 1 Package  by Does not apply route daily. 100 each 0  . lamoTRIgine (LAMICTAL) 150 MG tablet Take 150 mg by mouth daily.    . Liraglutide -Weight Management (SAXENDA) 18 MG/3ML SOPN Inject 0.5 mLs (3 mg total) into the skin daily. 1 pen 0  . mesalamine (LIALDA) 1.2 g EC tablet Take 4 tablets (4.8 g total) by mouth daily with breakfast. 60 tablet 3  . Multiple Vitamin (MULTIVITAMIN)  tablet Take 1 tablet by mouth daily.    . ondansetron (ZOFRAN ODT) 8 MG disintegrating tablet Take 1 tablet (8 mg total) by mouth every 8 (eight) hours as needed for nausea or vomiting. 20 tablet 4  . potassium chloride SA (KLOR-CON) 20 MEQ tablet Take 1 tablet (20 mEq total) by mouth daily as needed. 30 tablet 0  . propranolol (INDERAL) 10 MG tablet Take 1 tablet by mouth 3 (three) times daily as needed.    . Vitamin D, Ergocalciferol, (DRISDOL) 1.25 MG (50000 UNIT) CAPS capsule Take 1 capsule (50,000 Units total) by mouth every 7 (seven) days. 4 capsule 0  . mesalamine (CANASA) 1000 MG suppository Place 1 suppository (1,000 mg total) rectally at bedtime. 30 suppository 1   No current facility-administered medications for this visit.    Allergies Allergies  Allergen Reactions  . Doxycycline Rash and Swelling    Mouth and face  . Erythromycin Shortness Of Breath  . Sulfa Antibiotics Shortness Of Breath  . Penicillins Rash    Histories Past Medical History:  Diagnosis Date  . Allergy   . Anxiety   . Back pain   . Binge eating disorder   . Borderline personality disorder ([place])   . COPD (chronic obstructive pulmonary disease) ([facility])    chronic bronchitis  . Depression   . Edema, lower extremity   . Hip pain   . History of chicken pox   . Joint pain   . Memory loss   . Pancolitis ([place])   . Poor concentration   . Seasonal allergies   . Skin cancer, basal cell    basal cell  . Tinnitus   . Vitamin D deficiency    Past Surgical History:  Procedure Laterality Date  . CHOLECYSTECTOMY  2003  . MOHS SURGERY  10/2017  . VAGINAL DELIVERY  1995  . WISDOM TOOTH EXTRACTION  12/2017   Social History   Socioeconomic History  . Marital status: Legally Separated    Spouse name: Not on file  . Number of children: 3  . Years of education: Not on file  . Highest education level: Not on file  Occupational History  . Occupation: Aeronautical engineer    Comment: [employer]  Tobacco Use    . Smoking status: Never Smoker  . Smokeless tobacco: Never Used  Substance and Sexual Activity  . Alcohol use: Yes    Comment: rarely  . Drug use: Never  . Sexual activity: Not Currently    Birth control/protection: None  Other Topics Concern  . Not on file  Social History Narrative   Divorced   Works for [employer], has Designer, industrial/product   3 children   Right handed    Social Determinants of Radio broadcast assistant Strain:   . Difficulty of Paying Living Expenses:   Food Insecurity:   . Worried About Charity fundraiser in the Last Year:   . Arboriculturist in the Last Year:   Transportation Needs:   . Film/video editor (Medical):   Marland Kitchen Lack  of Transportation (Non-Medical):   Physical Activity:   . Days of Exercise per Week:   . Minutes of Exercise per Session:   Stress:   . Feeling of Stress :   Social Connections:   . Frequency of Communication with Friends and Family:   . Frequency of Social Gatherings with Friends and Family:   . Attends Religious Services:   . Active Member of Clubs or Organizations:   . Attends Archivist Meetings:   Marland Kitchen Marital Status:   Intimate Partner Violence:   . Fear of Current or Ex-Partner:   . Emotionally Abused:   Marland Kitchen Physically Abused:   . Sexually Abused:    Family History  Problem Relation Age of Onset  . Hypertension Mother   . Diabetes Father   . Colon cancer Neg Hx   . Esophageal cancer Neg Hx   . Rectal cancer Neg Hx   . Stomach cancer Neg Hx   . Pancreatic cancer Neg Hx   . Inflammatory bowel disease Neg Hx   . Liver disease Neg Hx   . Allergic rhinitis Neg Hx   . Angioedema Neg Hx   . Asthma Neg Hx   . Eczema Neg Hx   . Urticaria Neg Hx   . Immunodeficiency Neg Hx    I have reviewed her medical, social, and family history in detail and updated the electronic medical record as necessary.    PHYSICAL EXAMINATION  BP 110/70   Pulse 96   Temp 98.1 F (36.7 C)   Ht 5' 7" (1.702 m)   Wt  (!) 300 lb 6.4 oz (136.3 kg)   BMI 47.05 kg/m  GEN: NAD, appears stated age, doesn't appear chronically ill PSYCH: Cooperative, without pressured speech EYE: Conjunctivae pink, sclerae anicteric ENT: MMM CV: RR without R/Gs  RESP: No wheezing present GI: NABS, soft, rounded, nontender, without rebound or guarding MSK/EXT: No lower extremity edema SKIN: No jaundice NEURO:  Alert & Oriented x 3, no focal deficits   REVIEW OF DATA  I reviewed the following data at the time of this encounter:  GI Procedures and Studies  April 2021 Colonoscopy - Hemorrhoids found on digital rectal exam. - The examined portion of the ileum was normal. Biopsied. - Normal mucosa in the entire examined colon. Biopsied for IBD surveillance. - Non-bleeding non-thrombosed external and internal hemorrhoids. Pathology Diagnosis 1. Surgical [P], small bowel, terminal ileum - BENIGN SMALL BOWEL MUCOSA. - NO VILLOUS ATROPHY, INFLAMMATION OR OTHER ABNORMALITIES PRESENT. 2. Surgical [P], right colon - BENIGN COLONIC MUCOSA. - NO SIGNIFICANT INFLAMMATION OR OTHER ABNORMALITIES IDENTIFIED. 3. Surgical [P], left colon biopsies - MINIMALLY ACTIVE CHRONIC COLITIS, CONSISTENT WITH INFLAMMATORY BOWEL DISEASE. - THERE IS NO EVIDENCE OF DYSPLASIA OR MALIGNANCY. 4. Surgical [P], colon, rectum - MINIMALLY ACTIVE CHRONIC COLITIS, CONSISTENT WITH INFLAMMATORY BOWEL DISEASE. - THERE IS NO EVIDENCE OF DYSPLASIA OR MALIGNANCY.   Laboratory Studies  Reviewed in epic  Imaging Studies  No relevant studies to review   ASSESSMENT  Ms. Milone is a 54 y.o. female with a pmh significant for hx of BCC of the skin, Anxiety/MDD, COPD, Unclear Anaphylactoid reactions, Ulcerative pancolitis (diagnosed 2020 as etiology for her chronic diarrhea).  The patient is seen today for evaluation and management of:  1. 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 Fecal urgency   3. Chronic diarrhea   4. Fatty liver     The patient is hemodynamically and clinically stable.  Endoscopically, her colon has improved significantly  from last year and to this year.  She still has microscopic evidence of disease in her left colon and rectum although again this is improved from last year.  She has been on her current therapy for approximately 7 months at this time.  We discussed the potential role of stepup therapy for her.  This included the potential use of topical therapies such as suppositories or enemas to the left colon/rectum.  We also discussed the potential role of immunomodulators such as 6-MP and azathioprine.  We also discussed the potential role of Biologics.  As the patient is doing better but still having symptoms I believe we may end up needing to move up regards to her therapy.  We are going to try topical therapies first and thus we will start with mesalamine suppositories once nightly for a month and then every other night for the next month and see how she does and follow-up.  If her symptoms are significantly improving then we will hopefully be able to discontinue the a similar type fashion there.  If not, then we will need to consider stepping up with immunomodulators or Biologics.  She has history of basal cell carcinomas that have been removed and thus there is increased risks noted with both of these therapies as well as lymphomas and other anaphylactic-like reactions that can occur.  We will consider Entyvio or Stelara or Morrie Sheldon as something that may not have as much issue systemically but certainly continues to have growing data for its use.  She is initiating [place] with diet and I told her that GI issues such as diarrhea or constipation or nausea and vomiting pain can occur with this.  Before we make any major moves we will see how she does with the [place] of time under the direction of her other providers.  All patient questions were answered, to the best of my ability, and the patient agrees to the  aforementioned plan of action with follow-up as indicated.   PLAN  Continue Lialda 4.8 g daily Initiate mesalamine nightly for 1 month and then every other evening the next month Continue diarrhea diary We will discuss biologic/immune modulator therapy in future We will consider liver biopsy in future for her elevated ALT (as well as elevated anti-smooth muscle antibody) Laboratories as outlined below Fecal calprotectin to be drawn a few weeks before follow-up in clinic   Orders Placed This Encounter  Procedures  . Calprotectin, Fecal  . CBC  . Comp Met (CMET)  . Sedimentation rate  . CRP High sensitivity    New Prescriptions   MESALAMINE (CANASA) 1000 MG SUPPOSITORY    Place 1 suppository (1,000 mg total) rectally at bedtime.   Modified Medications   No medications on file    Planned Follow Up: Return in about 6 months (around 11/24/2019).   Total Time in Face-to-Face and in Coordination of Care for patient including independent/personal interpretation/review of prior testing, medical history, examination, medication adjustment, communicating results with the patient directly, and documentation with the EHR is 25 minutes.   Justice Britain, MD [facility] Office # [phone]

## 2019-05-24 NOTE — Patient Instructions (Signed)
Your provider has requested that you go to the basement level for lab work before leaving today. Press "B" on the elevator. The lab is located at the first door on the left as you exit the elevator.  We have sent the following medications to your pharmacy for you to pick up at your convenience: Mesalamine Suppositories- once per rectum for 1 month, then use every other night thereafter.   Biologics -see below   Humira- injection  Remicade- Infusion  Entyvio -Infusion  Xeljanz-oral  Stelara- Infusion to oral.    If you are age 27 or older, your body mass index should be between 23-30. Your Body mass index is 47.05 kg/m. If this is out of the aforementioned range listed, please consider follow up with your Primary Care Provider.  If you are age 44 or younger, your body mass index should be between 19-25. Your Body mass index is 47.05 kg/m. If this is out of the aformentioned range listed, please consider follow up with your Primary Care Provider.   Due to recent changes in healthcare laws, you may see the results of your imaging and laboratory studies on MyChart before your provider has had a chance to review them.  We understand that in some cases there may be results that are confusing or concerning to you. Not all laboratory results come back in the same time frame and the provider may be waiting for multiple results in order to interpret others.  Please give [name] 48 hours in order for your provider to thoroughly review all the results before contacting the office for clarification of your results.   Thank you for choosing me and [facility].  Dr. Rush Landmark

## 2019-05-26 ENCOUNTER — Encounter: Payer: Self-pay | Admitting: Gastroenterology

## 2019-05-26 DIAGNOSIS — R152 Fecal urgency: Secondary | ICD-10-CM | POA: Insufficient documentation

## 2019-06-05 ENCOUNTER — Other Ambulatory Visit: Payer: Self-pay

## 2019-06-05 ENCOUNTER — Ambulatory Visit: Payer: BC Managed Care – PPO | Admitting: Allergy & Immunology

## 2019-06-05 ENCOUNTER — Encounter: Payer: Self-pay | Admitting: Allergy & Immunology

## 2019-06-05 VITALS — BP 130/84 | HR 99 | Temp 97.7°F | Resp 18

## 2019-06-05 DIAGNOSIS — J3089 Other allergic rhinitis: Secondary | ICD-10-CM

## 2019-06-05 DIAGNOSIS — Z888 Allergy status to other drugs, medicaments and biological substances status: Secondary | ICD-10-CM

## 2019-06-05 DIAGNOSIS — T782XXD Anaphylactic shock, unspecified, subsequent encounter: Secondary | ICD-10-CM

## 2019-06-05 DIAGNOSIS — J302 Other seasonal allergic rhinitis: Secondary | ICD-10-CM

## 2019-06-05 MED ORDER — LEVOCETIRIZINE DIHYDROCHLORIDE 5 MG PO TABS: 5.0000 mg | ORAL_TABLET | Freq: Every evening | ORAL | 5 refills | Status: DC

## 2019-06-05 MED ORDER — FLUTICASONE PROPIONATE 50 MCG/ACT NA SUSP: 2.0000 | Freq: Every day | NASAL | 5 refills | Status: DC | PRN

## 2019-06-05 MED ORDER — IPRATROPIUM BROMIDE 0.03 % NA SOLN: 1.0000 | Freq: Four times a day (QID) | NASAL | 5 refills | Status: DC

## 2019-06-05 NOTE — Progress Notes (Signed)
FOLLOW UP  Date of Service/Encounter:  06/05/19   Assessment:   Anaphylaxis- unknown trigger  Seasonal and perennial allergic rhinitis(grasses, ragweed, trees, indoor molds, outdoor molds, dust mites, cat and dog)  Borderline personality disorder - currently receiving DBT    Chronic ulcerative colitis - followed by Dr.Mansouraty  Idiopathic anaphylaxis - unknown trigger   Plan/Recommendations:   1. Seasonal and perennial allergic rhinitis (grasses, ragweed, trees, indoor molds, outdoor molds, dust mites, cat and dog) - Continue taking: Flonase (fluticasone) two sprays per nostril daily as needed - Add on nasal Atrovent one spray per nostril up to four times daily (start with twice daily for now), but this does cause nsoe bleeds with frequent use.  - Stop the Zyrtec and start Xyzal 51m daily. - You can use an extra dose of the antihistamine, if needed, for breakthrough symptoms.  - Consider nasal saline rinses 1-2 times daily to remove allergens from the nasal cavities as well as help with mucous clearance (this is especially helpful to do before the nasal sprays are given) - Consider allergy shots as a means of long-term control.  - CPT codes provided.   2. Anaphylactic reactions - with normal workup thus far (occurs ~ 4 times per year) - I am still unsure of the trigger, but it seems to be becoming less frequent.  3. Return in about 6 months (around 12/06/2019). This can be an in-person, a virtual Webex or a telephone follow up visit.   Subjective:   Leslie KAMARIS DELAFUENTEis a 54y.o. female presenting today for follow up of  Chief Complaint  Patient presents with  . Allergies    Post Nasal Drip   . Allergic Reaction    To a weight loss drug in May (Saxenda)    Leslie K OHinchcliffhas a history of the following: Patient Active Problem List   Diagnosis Date Noted  . Fecal urgency 05/26/2019  . Fatty liver 04/08/2019  . Encounter for other preprocedural  examination 04/08/2019  . Borderline personality disorder ([place] 03/27/2019  . Chronic colitis 11/25/2018  . Abnormal LFTs 10/14/2018  . Positive Anti-Smooth Muscle antibody  10/04/2018  . History of allergy to aspirin 09/09/2018  . Anaphylaxis 05/14/2018  . Nausea and vomiting 05/13/2018  . Abnormal colonoscopy 04/04/2018  . Chronic diarrhea 04/04/2018  . 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 IBD (inflammatory bowel disease) 04/04/2018  . History of cholecystectomy 04/04/2018  . LUQ pain 04/04/2018  . Basal cell carcinoma 10/20/2017  . Anxiety with depression 10/20/2017  . Binge eating disorder 10/20/2017    History obtained from: chart review and patient.  Tola is a 54y.o. female presenting for a follow up visit. She was last seen in January 2021. At that time, we continued with cetirizine 148mdaily as well as fluticasone two sprays per nostril daily. We did discuss allergen immunotherapy as a means of long term control. For her history of anaphylaxis, she was having around 4 episodes per year. We got a repeat tryptase that was normal. We also obtained an alpha gal panel that was negative. We discussed initiating Xolair for immunomodulation.   Since hte last visit, she has mostly done well. She did go to [facility] in May 2021. She was started on Saxenda for weight loss (injectable) and she was experiencing nausea. This was intense nausea and vomiting.  Allergic Rhinitis Symptom History: Her postnasal drip continues to be an issue with fluticasone and cetirizine.  She  is open to changing nose sprays.  She is open to allergy shots as well and would like to find out from her insurance company how much this would be.  She has not required any antibiotics at all since last visit.  Her allergic reactions have overall decreased in frequency.  She has not had one since New Year's Eve.   There is discussion about her starting a biologic for  her ulcerative colitis.  Apparently her latest colonoscopy showed colitis and inflammation in the left side of the colon.  However, this was better than the first time when she had pancolitis.  The addition of the mesalamine has helped with that.  Otherwise, there have been no changes to her past medical history, surgical history, family history, or social history.    Review of Systems  Constitutional: Negative.  Negative for chills, fever, malaise/fatigue and weight loss.  HENT: Negative.  Negative for congestion, ear discharge, ear pain, sinus pain and sore throat.   Eyes: Negative for pain, discharge and redness.  Respiratory: Negative for cough, sputum production, shortness of breath and wheezing.   Cardiovascular: Negative.  Negative for chest pain and palpitations.  Gastrointestinal: Negative for abdominal pain, constipation, diarrhea, heartburn, nausea and vomiting.  Skin: Negative.  Negative for itching and rash.  Neurological: Negative for dizziness and headaches.  Endo/Heme/Allergies: Negative for environmental allergies. Does not bruise/bleed easily.       Objective:   Blood pressure 130/84, pulse 99, temperature 97.7 F (36.5 C), temperature source Temporal, resp. rate 18, SpO2 97 %. There is no height or weight on file to calculate BMI.   Physical Exam:  Physical Exam  Constitutional: She appears well-developed and well-nourished.  Obese female.  Pleasant.  HENT:  Head: Normocephalic and atraumatic.  Right Ear: Tympanic membrane, external ear and ear canal normal.  Left Ear: Tympanic membrane, external ear and ear canal normal.  Nose: Mucosal edema and rhinorrhea present. No nasal deformity or septal deviation. No epistaxis. Right sinus exhibits no maxillary sinus tenderness and no frontal sinus tenderness. Left sinus exhibits no maxillary sinus tenderness and no frontal sinus tenderness.  Mouth/Throat: Uvula is midline and oropharynx is clear and moist. Mucous  membranes are not pale and not dry.  Tonsils enlarged bilaterally.  There is some cobblestoning apparent.  Eyes: Pupils are equal, round, and reactive to light. Conjunctivae and EOM are normal. Right eye exhibits no chemosis and no discharge. Left eye exhibits no chemosis and no discharge. Right conjunctiva is not injected. Left conjunctiva is not injected.  Cardiovascular: Normal rate, regular rhythm and normal heart sounds.  Respiratory: Effort normal and breath sounds normal. No accessory muscle usage. No tachypnea. No respiratory distress. She has no wheezes. She has no rhonchi. She has no rales. She exhibits no tenderness.  Moving air well in all lung fields.  No increased work of breathing.  Lymphadenopathy:    She has no cervical adenopathy.  Neurological: She is alert.  Skin: No abrasion, no petechiae and no rash noted. Rash is not papular, not vesicular and not urticarial. No erythema. No pallor.  No eczematous or urticarial lesions noted.  Psychiatric: She has a normal mood and affect.     Diagnostic studies: none     Salvatore Marvel, MD  [facility] [place] [place]

## 2019-06-05 NOTE — Patient Instructions (Addendum)
1. Seasonal and perennial allergic rhinitis (grasses, ragweed, trees, indoor molds, outdoor molds, dust mites, cat and dog) - Continue taking: Flonase (fluticasone) two sprays per nostril daily as needed - Add on nasal Atrovent one spray per nostril up to four times daily (start with twice daily for now), but this does cause nsoe bleeds with frequent use.  - Stop the Zyrtec and start Xyzal 29m daily. - You can use an extra dose of the antihistamine, if needed, for breakthrough symptoms.  - Consider nasal saline rinses 1-2 times daily to remove allergens from the nasal cavities as well as help with mucous clearance (this is especially helpful to do before the nasal sprays are given) - Consider allergy shots as a means of long-term control.  - CPT codes provided.   2. Anaphylactic reactions - with normal workup thus far (occurs ~ 4 times per year) - I am still unsure of the trigger, but it seems to be becoming less frequent.  3. Return in about 6 months (around 12/06/2019). This can be an in-person, a virtual Webex or a telephone follow up visit.   Please inform [place] any Emergency Department visits, hospitalizations, or changes in symptoms. Call [place]efore going to the ED for breathing or allergy symptoms since we might be able to fit you in for a sick visit. Feel free to contact u[place]anytime with any questions, problems, or concerns.  It was a pleasure to see you again today!  Websites that have reliable patient information: 1. American Academy of Asthma, Allergy, and Immunology: www.aaaai.org 2. Food Allergy Research and Education (FARE): foodallergy.org 3. Mothers of Asthmatics: http://www.asthmacommunitynetwork.org 4. American College of Allergy, Asthma, and Immunology: www.acaai.org   COVID-19 Vaccine Information can be found at: [email or web address] questions related to vaccine distribution or appointments, please email  [email or web address] or call [phone]     "Like" [facility] Facebook and Instagram for our latest updates!       HAPPY SPRING!  Make sure you are registered to vote! If you have moved or changed any of your contact information, you will need to get this updated before voting!  In some cases, you MAY be able to register to vote online: [email or web address]

## 2019-06-08 ENCOUNTER — Encounter (INDEPENDENT_AMBULATORY_CARE_PROVIDER_SITE_OTHER): Payer: Self-pay | Admitting: Family Medicine

## 2019-06-12 ENCOUNTER — Other Ambulatory Visit: Payer: BC Managed Care – PPO

## 2019-06-12 DIAGNOSIS — R152 Fecal urgency: Secondary | ICD-10-CM

## 2019-06-12 DIAGNOSIS — K5100 Ulcerative (chronic) pancolitis without complications: Secondary | ICD-10-CM

## 2019-06-12 NOTE — Telephone Encounter (Signed)
Ms. Malachy Mood please r/s   Dr. Evelina Dun

## 2019-06-13 ENCOUNTER — Ambulatory Visit (INDEPENDENT_AMBULATORY_CARE_PROVIDER_SITE_OTHER): Payer: BC Managed Care – PPO | Admitting: Family Medicine

## 2019-06-15 LAB — CALPROTECTIN, FECAL: Calprotectin, Fecal: 47 ug/g (ref 0–120)

## 2019-07-03 ENCOUNTER — Other Ambulatory Visit: Payer: Self-pay

## 2019-07-03 ENCOUNTER — Encounter (INDEPENDENT_AMBULATORY_CARE_PROVIDER_SITE_OTHER): Payer: Self-pay | Admitting: Family Medicine

## 2019-07-03 ENCOUNTER — Ambulatory Visit (INDEPENDENT_AMBULATORY_CARE_PROVIDER_SITE_OTHER): Payer: BC Managed Care – PPO | Admitting: Family Medicine

## 2019-07-03 VITALS — BP 138/92 | HR 101 | Temp 97.6°F | Ht 67.0 in | Wt 302.0 lb

## 2019-07-03 DIAGNOSIS — E8881 Metabolic syndrome: Secondary | ICD-10-CM

## 2019-07-03 DIAGNOSIS — Z9189 Other specified personal risk factors, not elsewhere classified: Secondary | ICD-10-CM | POA: Diagnosis not present

## 2019-07-03 DIAGNOSIS — Z6841 Body Mass Index (BMI) 40.0 and over, adult: Secondary | ICD-10-CM

## 2019-07-03 DIAGNOSIS — F5081 Binge eating disorder: Secondary | ICD-10-CM | POA: Diagnosis not present

## 2019-07-03 DIAGNOSIS — E559 Vitamin D deficiency, unspecified: Secondary | ICD-10-CM

## 2019-07-03 DIAGNOSIS — F50819 Binge eating disorder, unspecified: Secondary | ICD-10-CM

## 2019-07-03 DIAGNOSIS — E88819 Insulin resistance, unspecified: Secondary | ICD-10-CM

## 2019-07-03 MED ORDER — VITAMIN D (ERGOCALCIFEROL) 1.25 MG (50000 UNIT) PO CAPS: 50000.0000 [IU] | ORAL_CAPSULE | ORAL | 0 refills | Status: DC

## 2019-07-03 MED ORDER — LISDEXAMFETAMINE DIMESYLATE 20 MG PO CAPS: 20.0000 mg | ORAL_CAPSULE | Freq: Every morning | ORAL | 0 refills | Status: DC

## 2019-07-03 NOTE — Progress Notes (Signed)
Chief Complaint:   OBESITY Leslie Douglas is here to discuss her progress with her obesity treatment plan along with follow-up of her obesity related diagnoses. Leslie Douglas is on practicing portion control and making smarter food choices, such as increasing vegetables and decreasing simple carbohydrates and states she is following her eating plan approximately 0% of the time. Leslie Douglas states she is exercising for 0 minutes 0 times per week.  Today's visit was #: 4 Starting weight: 297 lbs Starting date: 04/12/2019 Today's weight: 302 lbs Today's date: 07/03/2019 Total lbs lost to date: 0 Total lbs lost since last in-office visit: 0  Interim History: Leslie Douglas says she purchased a Warden/ranger.  It was an [employer] deal.  She did not tolerate Saxenda.    Subjective:   1. Vitamin D deficiency Leslie Douglas's Vitamin D level was 24.6 on 04/12/2019. She is currently taking prescription vitamin D 50,000 IU each week. She denies nausea, vomiting or muscle weakness.  2. Insulin resistance Leslie Douglas has a diagnosis of insulin resistance based on her elevated fasting insulin level >5. She continues to work on diet and exercise to decrease her risk of diabetes.  She did not tolerate Saxenda.  Lab Results  Component Value Date   INSULIN 18.8 04/12/2019   Lab Results  Component Value Date   HGBA1C 5.5 10/20/2017   3. Binge eating disorder Leslie Douglas has binge eating disorder and is followed by Dr. Daron Offer.  Assessment/Plan:   1. Vitamin D deficiency Low Vitamin D level contributes to fatigue and are associated with obesity, breast, and colon cancer. She agrees to continue to take prescription Vitamin D @50 ,000 IU every week and will follow-up for routine testing of Vitamin D, at least 2-3 times per year to avoid over-replacement.  Orders - Vitamin D, Ergocalciferol, (DRISDOL) 1.25 MG (50000 UNIT) CAPS capsule; Take 1 capsule (50,000 Units total) by mouth every 7 (seven) days.  Dispense: 4 capsule; Refill:  0  2. Insulin resistance Leslie Douglas will continue to work on weight loss, exercise, and decreasing simple carbohydrates to help decrease the risk of diabetes. Leslie Douglas agreed to follow-up with [name] as directed to closely monitor her progress.  3. Binge eating disorder Leslie Douglas will start Vyvanse, as per below.  Orders - lisdexamfetamine (VYVANSE) 20 MG capsule; Take 1 capsule (20 mg total) by mouth in the morning.  Dispense: 30 capsule; Refill: 0  4. At risk for hypertension Leslie Douglas was given approximately 15 minutes of hypertension prevention counseling today. Leslie Douglas is at risk for hypertension due to obesity. We discussed intensive lifestyle modifications today with an emphasis on weight loss as well as increasing exercise and decreasing salt intake.  Repetitive spaced learning was employed today to elicit superior memory formation and behavioral change.  5. Class 3 severe obesity with serious comorbidity and body mass index (BMI) of 45.0 to 49.9 in adult, unspecified obesity type (Leslie Douglas) Leslie Douglas is currently in the action stage of change. As such, her goal is to continue with weight loss efforts. She has agreed to practicing portion control and making smarter food choices, such as increasing vegetables and decreasing simple carbohydrates.   Exercise goals: For substantial health benefits, adults should do at least 150 minutes (2 hours and 30 minutes) a week of moderate-intensity, or 75 minutes (1 hour and 15 minutes) a week of vigorous-intensity aerobic physical activity, or an equivalent combination of moderate- and vigorous-intensity aerobic activity. Aerobic activity should be performed in episodes of at least 10 minutes, and preferably, it should  be spread throughout the week.  Behavioral modification strategies: increasing lean protein intake, increasing water intake and decreasing sodium intake.  Increase water and incorporate 1 fruit/vegetable per day.  She has Lasix at home and  will use it.  Leslie Douglas has agreed to follow-up with our clinic in 3 weeks. She was informed of the importance of frequent follow-up visits to maximize her success with intensive lifestyle modifications for her multiple health conditions.   Objective:   Blood pressure (!) 138/92, pulse (!) 101, temperature 97.6 F (36.4 C), temperature source Oral, height 5' 7"  (1.702 m), weight (!) 302 lb (137 kg), SpO2 97 %. Body mass index is 47.3 kg/m.  General: Cooperative, alert, well developed, in no acute distress. HEENT: Conjunctivae and lids unremarkable. Cardiovascular: Regular rhythm.  Lungs: Normal work of breathing. Neurologic: No focal deficits.   Lab Results  Component Value Date   CREATININE 0.85 05/24/2019   BUN 16 05/24/2019   NA 139 05/24/2019   K 4.1 05/24/2019   CL 102 05/24/2019   CO2 32 05/24/2019   Lab Results  Component Value Date   ALT 39 (H) 05/24/2019   AST 28 05/24/2019   ALKPHOS 67 05/24/2019   BILITOT 0.3 05/24/2019   Lab Results  Component Value Date   HGBA1C 5.5 10/20/2017   Lab Results  Component Value Date   INSULIN 18.8 04/12/2019   Lab Results  Component Value Date   TSH 2.32 03/27/2019   Lab Results  Component Value Date   CHOL 224 (H) 10/20/2017   HDL 53 10/20/2017   LDLCALC 142 (H) 10/20/2017   TRIG 147 10/20/2017   CHOLHDL 4.2 10/20/2017   Lab Results  Component Value Date   WBC 7.3 05/24/2019   HGB 12.9 05/24/2019   HCT 38.3 05/24/2019   MCV 91.1 05/24/2019   PLT 383.0 05/24/2019   Lab Results  Component Value Date   IRON 89 09/21/2018   TIBC 346 09/21/2018   FERRITIN 90.3 09/21/2018   Attestation Statements:   Reviewed by clinician on day of visit: allergies, medications, problem list, medical history, surgical history, family history, social history, and previous encounter notes.  I, Water quality scientist, CMA, am acting as transcriptionist for Briscoe Deutscher, DO  I have reviewed the above documentation for accuracy and  completeness, and I agree with the above. Briscoe Deutscher, DO

## 2019-07-25 ENCOUNTER — Ambulatory Visit (INDEPENDENT_AMBULATORY_CARE_PROVIDER_SITE_OTHER): Payer: BC Managed Care – PPO | Admitting: Family Medicine

## 2019-07-25 ENCOUNTER — Other Ambulatory Visit: Payer: Self-pay

## 2019-07-25 ENCOUNTER — Encounter (INDEPENDENT_AMBULATORY_CARE_PROVIDER_SITE_OTHER): Payer: Self-pay | Admitting: Family Medicine

## 2019-07-25 VITALS — BP 120/86 | HR 101 | Temp 97.9°F | Ht 67.0 in | Wt 300.0 lb

## 2019-07-25 DIAGNOSIS — F5081 Binge eating disorder: Secondary | ICD-10-CM | POA: Diagnosis not present

## 2019-07-25 DIAGNOSIS — E559 Vitamin D deficiency, unspecified: Secondary | ICD-10-CM | POA: Diagnosis not present

## 2019-07-25 DIAGNOSIS — Z9189 Other specified personal risk factors, not elsewhere classified: Secondary | ICD-10-CM | POA: Diagnosis not present

## 2019-07-25 DIAGNOSIS — Z6841 Body Mass Index (BMI) 40.0 and over, adult: Secondary | ICD-10-CM

## 2019-07-25 DIAGNOSIS — F50819 Binge eating disorder, unspecified: Secondary | ICD-10-CM

## 2019-07-25 MED ORDER — VYVANSE 40 MG PO CHEW: 1.0000 | CHEWABLE_TABLET | Freq: Every day | ORAL | 0 refills | Status: DC

## 2019-07-25 MED ORDER — VITAMIN D (ERGOCALCIFEROL) 1.25 MG (50000 UNIT) PO CAPS: 50000.0000 [IU] | ORAL_CAPSULE | ORAL | 0 refills | Status: DC

## 2019-07-25 NOTE — Progress Notes (Signed)
Chief Complaint:   OBESITY Leslie Douglas is here to discuss her progress with her obesity treatment plan along with follow-up of her obesity related diagnoses. Leslie Douglas is on practicing portion control and making smarter food choices, such as increasing vegetables and decreasing simple carbohydrates and states she is following her eating plan approximately 0% of the time. Leslie Douglas states she is increasing her walking for exercise.  Today's visit was #: 5 Starting weight: 297 lbs Starting date: 04/12/2019 Today's weight: 300 lbs Today's date: 07/25/2019 Total lbs lost to date: 0 Total lbs lost since last in-office visit: 2 lbs  Interim History: Leslie Douglas has been using her Nutribullet and making a smoothie with [place] yogurt, oat milk, creamy banana, and other fruit.  She has a sandwich for lunch with lettuce and tomato.  She has worked on increasing her fruits and vegetables since her last visit.  Subjective:   1. Vitamin D deficiency Tuesday's Vitamin D level was 24.6 on 04/12/2019. She is currently taking prescription vitamin D 50,000 IU each week. She denies nausea, vomiting or muscle weakness.  2. Binge eating disorder Leslie Douglas is taking Vyvanse 20 mg daily for binge eating disorder.  3. At risk for constipation Leslie Douglas is at increased risk for constipation due to inadequate water intake, changes in diet, and/or use of medications such as GLP1 agonists. Leslie Douglas denies hard, infrequent stools currently.   Assessment/Plan:   1. Vitamin D deficiency Low Vitamin D level contributes to fatigue and are associated with obesity, breast, and colon cancer. She agrees to continue to take prescription Vitamin D @50 ,000 IU every week and will follow-up for routine testing of Vitamin D, at least 2-3 times per year to avoid over-replacement.  Orders - Vitamin D, Ergocalciferol, (DRISDOL) 1.25 MG (50000 UNIT) CAPS capsule; Take 1 capsule (50,000 Units total) by mouth every 7 (seven) days.  Dispense: 4  capsule; Refill: 0  2. Binge eating disorder Will increase Vyvanse to 40 mg every morning, as per below.  Orders - lisdexamfetamine (VYVANSE) 40 MG capsule; Take 1 capsule (40 mg total) by mouth in the morning.  Dispense: 30 capsule; Refill: 0  3. At risk for constipation Leslie Douglas was given approximately 15 minutes of counseling today regarding prevention of constipation. She was encouraged to increase water and fiber intake.   4. Class 3 severe obesity with serious comorbidity and body mass index (BMI) of 45.0 to 49.9 in adult, unspecified obesity type (Leslie Douglas) Leslie Douglas is currently in the action stage of change. As such, her goal is to continue with weight loss efforts. She has agreed to practicing portion control and making smarter food choices, such as increasing vegetables and decreasing simple carbohydrates.   Exercise goals: For substantial health benefits, adults should do at least 150 minutes (2 hours and 30 minutes) a week of moderate-intensity, or 75 minutes (1 hour and 15 minutes) a week of vigorous-intensity aerobic physical activity, or an equivalent combination of moderate- and vigorous-intensity aerobic activity. Aerobic activity should be performed in episodes of at least 10 minutes, and preferably, it should be spread throughout the week. Increase number of days on the treadmill.  Behavioral modification strategies: increasing lean protein intake and increasing water intake.  Leslie Douglas has agreed to follow-up with our clinic in 3 weeks. She was informed of the importance of frequent follow-up visits to maximize her success with intensive lifestyle modifications for her multiple health conditions.   Objective:   Blood pressure 120/86, pulse (!) 101, temperature 97.9 F (36.6 C), temperature  source Oral, height 5' 7"  (1.702 m), weight 300 lb (136.1 kg), SpO2 94 %. Body mass index is 46.99 kg/m.  General: Cooperative, alert, well developed, in no acute distress. HEENT:  Conjunctivae and lids unremarkable. Cardiovascular: Regular rhythm.  Lungs: Normal work of breathing. Neurologic: No focal deficits.   Lab Results  Component Value Date   CREATININE 0.85 05/24/2019   BUN 16 05/24/2019   NA 139 05/24/2019   K 4.1 05/24/2019   CL 102 05/24/2019   CO2 32 05/24/2019   Lab Results  Component Value Date   ALT 39 (H) 05/24/2019   AST 28 05/24/2019   ALKPHOS 67 05/24/2019   BILITOT 0.3 05/24/2019   Lab Results  Component Value Date   HGBA1C 5.5 10/20/2017   Lab Results  Component Value Date   INSULIN 18.8 04/12/2019   Lab Results  Component Value Date   TSH 2.32 03/27/2019   Lab Results  Component Value Date   CHOL 224 (H) 10/20/2017   HDL 53 10/20/2017   LDLCALC 142 (H) 10/20/2017   TRIG 147 10/20/2017   CHOLHDL 4.2 10/20/2017   Lab Results  Component Value Date   WBC 7.3 05/24/2019   HGB 12.9 05/24/2019   HCT 38.3 05/24/2019   MCV 91.1 05/24/2019   PLT 383.0 05/24/2019   Lab Results  Component Value Date   IRON 89 09/21/2018   TIBC 346 09/21/2018   FERRITIN 90.3 09/21/2018   Attestation Statements:   Reviewed by clinician on day of visit: allergies, medications, problem list, medical history, surgical history, family history, social history, and previous encounter notes.  I, Water quality scientist, CMA, am acting as transcriptionist for Briscoe Deutscher, DO  I have reviewed the above documentation for accuracy and completeness, and I agree with the above. Briscoe Deutscher, DO

## 2019-08-15 ENCOUNTER — Other Ambulatory Visit: Payer: Self-pay | Admitting: Gastroenterology

## 2019-08-16 ENCOUNTER — Other Ambulatory Visit: Payer: Self-pay

## 2019-08-16 ENCOUNTER — Ambulatory Visit (INDEPENDENT_AMBULATORY_CARE_PROVIDER_SITE_OTHER): Payer: BC Managed Care – PPO | Admitting: Family Medicine

## 2019-08-16 ENCOUNTER — Encounter (INDEPENDENT_AMBULATORY_CARE_PROVIDER_SITE_OTHER): Payer: Self-pay | Admitting: Family Medicine

## 2019-08-16 VITALS — BP 142/84 | HR 93 | Temp 98.0°F | Ht 67.0 in | Wt 300.0 lb

## 2019-08-16 DIAGNOSIS — F5081 Binge eating disorder: Secondary | ICD-10-CM | POA: Diagnosis not present

## 2019-08-16 DIAGNOSIS — E559 Vitamin D deficiency, unspecified: Secondary | ICD-10-CM

## 2019-08-16 DIAGNOSIS — Z6841 Body Mass Index (BMI) 40.0 and over, adult: Secondary | ICD-10-CM

## 2019-08-19 MED ORDER — VITAMIN D (ERGOCALCIFEROL) 1.25 MG (50000 UNIT) PO CAPS: 50000.0000 [IU] | ORAL_CAPSULE | ORAL | 0 refills | Status: DC

## 2019-08-19 MED ORDER — VYVANSE 40 MG PO CHEW: 1.0000 | CHEWABLE_TABLET | Freq: Every day | ORAL | 0 refills | Status: DC

## 2019-08-20 NOTE — Progress Notes (Signed)
Chief Complaint:   OBESITY Leslie Douglas is here to discuss her progress with her obesity treatment plan along with follow-up of her obesity related diagnoses. Leslie Douglas is on practicing portion control and making smarter food choices, such as increasing vegetables and decreasing simple carbohydrates and states she is following her eating plan approximately 0% of the time. Leslie Douglas states she is doing cardio for 10-15 minutes 3 times per week.  Today's visit was #: 6 Starting weight: 297 lbs Starting date: 04/12/2019 Today's weight: 300 lbs Today's date: 08/16/2019 Total lbs lost to date: 0 Total lbs lost since last in-office visit: 0  Interim History: Leslie Douglas has been eating zucchini and fruit smoothies.  She did well at her goal of increasing fruits and vegetables.  She says work has been super stressful.  Subjective:   1. Vitamin D deficiency Leslie Douglas's Vitamin D level was 24.6 on 04/12/2019. She is currently taking prescription vitamin D 50,000 IU each week. She denies nausea, vomiting or muscle weakness.  2. Binge eating disorder Leslie Douglas is taking Vyvanse 20 mg twice daily.  Assessment/Plan:   1. Vitamin D deficiency Not at goal. Optimal goal > 50 ng/dL. There is also evidence to support a goal of >70 ng/dL in patients with cancer and heart disease.  She agrees to continue to take prescription Vitamin D @50 ,000 IU every week and will follow-up for routine testing of Vitamin D, at least 2-3 times per year to avoid over-replacement.  Orders - Vitamin D, Ergocalciferol, (DRISDOL) 1.25 MG (50000 UNIT) CAPS capsule; Take 1 capsule (50,000 Units total) by mouth every 7 (seven) days.  Dispense: 4 capsule; Refill: 0  2. Binge eating disorder Not at goal. Binge eating disorder is characterized by eating large amounts of food in short periods of time, often when a person is not hungry, according to the National Eating Disorders Association. People who binge eat feel as if they don't have control  over how much they eat and have feelings of guilt or self-loathing after a binge eating episode. Leslie Douglas estimates that about 30 percent of adults with binge eating disorder also have a history of ADHD. Binge eating and related obesity can underlie health problems like heart disease and diabetes.   . The FDA has approved Vyvanse as a treatment option for both ADHD and binge eating. Vyvanse targets the brain's reward center by increasing the levels of dopamine and norepinephrine, the chemicals of the brain responsible for feelings of pleasure.  . Mindful eating is the recommended nutritional approach to treating BED.   I have consulted the [place] Controlled Substances Registry for this patient, and feel the risk/benefit ratio today is favorable for proceeding with this prescription for a controlled substance. The patient understands monitoring parameters and red flags.   Orders - Lisdexamfetamine Dimesylate (VYVANSE) 40 MG CHEW; Chew 1 tablet by mouth daily.  Dispense: 30 tablet; Refill: 0  3. Class 3 severe obesity with serious comorbidity and body mass index (BMI) of 45.0 to 49.9 in adult, unspecified obesity type (Leslie Douglas) Leslie Douglas is currently in the action stage of change. As such, her goal is to continue with weight loss efforts. She has agreed to practicing portion control and making smarter food choices, such as increasing vegetables and decreasing simple carbohydrates.   Exercise goals: As is.  Behavioral modification strategies: increasing lean protein intake and increasing water intake.  Stop working for lunch and practice mindfulness.  Leslie Douglas has agreed to follow-up with our clinic in 2-3 weeks. She was  informed of the importance of frequent follow-up visits to maximize her success with intensive lifestyle modifications for her multiple health conditions.   Objective:   Blood pressure (!) 142/84, pulse 93, temperature 98 F (36.7 C), temperature source Oral, height 5' 7"  (1.702 m),  weight 300 lb (136.1 kg), SpO2 98 %. Body mass index is 46.99 kg/m.  General: Cooperative, alert, well developed, in no acute distress. HEENT: Conjunctivae and lids unremarkable. Cardiovascular: Regular rhythm.  Lungs: Normal work of breathing. Neurologic: No focal deficits.   Lab Results  Component Value Date   CREATININE 0.85 05/24/2019   BUN 16 05/24/2019   NA 139 05/24/2019   K 4.1 05/24/2019   CL 102 05/24/2019   CO2 32 05/24/2019   Lab Results  Component Value Date   ALT 39 (H) 05/24/2019   AST 28 05/24/2019   ALKPHOS 67 05/24/2019   BILITOT 0.3 05/24/2019   Lab Results  Component Value Date   HGBA1C 5.5 10/20/2017   Lab Results  Component Value Date   INSULIN 18.8 04/12/2019   Lab Results  Component Value Date   TSH 2.32 03/27/2019   Lab Results  Component Value Date   CHOL 224 (H) 10/20/2017   HDL 53 10/20/2017   LDLCALC 142 (H) 10/20/2017   TRIG 147 10/20/2017   CHOLHDL 4.2 10/20/2017   Lab Results  Component Value Date   WBC 7.3 05/24/2019   HGB 12.9 05/24/2019   HCT 38.3 05/24/2019   MCV 91.1 05/24/2019   PLT 383.0 05/24/2019   Lab Results  Component Value Date   IRON 89 09/21/2018   TIBC 346 09/21/2018   FERRITIN 90.3 09/21/2018   Attestation Statements:   Reviewed by clinician on day of visit: allergies, medications, problem list, medical history, surgical history, family history, social history, and previous encounter notes.  I, Water quality scientist, CMA, am acting as transcriptionist for Briscoe Deutscher, DO  I have reviewed the above documentation for accuracy and completeness, and I agree with the above. Briscoe Deutscher, DO

## 2019-09-05 NOTE — Patient Instructions (Signed)
   Eat only in one specified room of the house, such as the dining room. Limit all of your eating, even snacks, to that one room. Identify a similar place at work.   Slow down your eating. Give your stomach a chance to register that it is full. Put your fork, knife or spoon down between bites of food.   Serve foods in the desired portions directly on your plate. Do not use serving bowls at the dinner table. Avoid second helpings by putting extra food away before you sit down for at the meal.   Do not eat while you are involved in other activities. Avoid sitting in front of the television or reading a book while you are eating. When you eat, eating should be the main focus of your attention.

## 2019-09-11 ENCOUNTER — Other Ambulatory Visit: Payer: Self-pay

## 2019-09-11 ENCOUNTER — Ambulatory Visit (INDEPENDENT_AMBULATORY_CARE_PROVIDER_SITE_OTHER): Payer: BC Managed Care – PPO | Admitting: Family Medicine

## 2019-09-11 ENCOUNTER — Encounter (INDEPENDENT_AMBULATORY_CARE_PROVIDER_SITE_OTHER): Payer: Self-pay | Admitting: Family Medicine

## 2019-09-11 VITALS — BP 131/90 | HR 94 | Temp 97.8°F | Ht 67.0 in | Wt 304.0 lb

## 2019-09-11 DIAGNOSIS — E559 Vitamin D deficiency, unspecified: Secondary | ICD-10-CM

## 2019-09-11 DIAGNOSIS — Z9189 Other specified personal risk factors, not elsewhere classified: Secondary | ICD-10-CM

## 2019-09-11 DIAGNOSIS — Z6841 Body Mass Index (BMI) 40.0 and over, adult: Secondary | ICD-10-CM

## 2019-09-11 DIAGNOSIS — F5081 Binge eating disorder: Secondary | ICD-10-CM | POA: Diagnosis not present

## 2019-09-11 NOTE — Progress Notes (Signed)
Chief Complaint:   OBESITY Leslie Douglas is here to discuss her progress with her obesity treatment plan along with follow-up of her obesity related diagnoses. Leslie Douglas is on practicing portion control and making smarter food choices, such as increasing vegetables and decreasing simple carbohydrates and states she is following her eating plan approximately 90+% of the time. Leslie Douglas states she is walking for 10-15 minutes 2-3 times per week.  Today's visit was #: 7 Starting weight: 297 lbs Starting date: 04/12/2019 Today's weight: 304 lbs Today's date: 09/11/2019  Interim History: Leslie Douglas says she is now working from home.  Her goal from the last visit was to stop working through lunch and to take a moment for herself. She is now taking 1 hour for lunch.  She says she is working on decreasing portion sizes.  The transition has ultimately been helpful, but the stress is still high.   Assessment/Plan:   1. Binge eating disorder Leslie Douglas meets binge eating disorder (BED) requirements, including: binging 4-13 times a week , eating until feeling uncomfortably full, eating large amounts of food when not feeling physically full, eating alone because of feeling embarrassed by how much you are eating, feeling disgusted and depressed or guilty about binge eating.   People who binge eat feel as if they don't have control over how much they eat and have feelings of guilt or self-loathing after a binge eating episode. Leslie Douglas estimates that about 30 percent of adults with binge eating disorder also have a history of ADHD. Binge eating and related obesity can underlie health problems like heart disease and diabetes. The FDA has approved Vyvanse as a treatment option for both ADHD and binge eating. Vyvanse targets the brain's reward center by increasing the levels of dopamine and norepinephrine, the chemicals of the brain responsible for feelings of pleasure. Mindful eating is the recommended nutritional  approach to treating BED.   2. Vitamin D deficiency Current vitamin D is 24.6, tested on 04/12/2019. Not at goal. Optimal goal > 50 ng/dL. There is also evidence to support a goal of >70 ng/dL in patients with cancer and heart disease. Plan: Continue Vitamin D @50 ,000 IU every week with follow-up for routine testing of Vitamin D at least 2-3 times per year to avoid over-replacement.  3. At increased risk of exposure to COVID-19 virus The patient is at higher risk of COVID-19 infection due to higher infection rates locally.  She will be going into the office today and there are office staff who are out with [place]. She was advised to make sure that her vaccine status is up-to-date, to social distance, wear a proper mask, to wash her hands often, and to isolate/test if she has an exposure or develops symptoms related to COVID.   4. Class 3 severe obesity with serious comorbidity and body mass index (BMI) of 45.0 to 49.9 in adult, unspecified obesity type (Leslie Douglas) Leslie Douglas is currently in the action stage of change. As such, her goal is to continue with weight loss efforts. She has agreed to practicing portion control and making smarter food choices, such as increasing vegetables and decreasing simple carbohydrates.   Exercise goals: For substantial health benefits, adults should do at least 150 minutes (2 hours and 30 minutes) a week of moderate-intensity, or 75 minutes (1 hour and 15 minutes) a week of vigorous-intensity aerobic physical activity, or an equivalent combination of moderate- and vigorous-intensity aerobic activity. Aerobic activity should be performed in episodes of at least 10 minutes, and  preferably, it should be spread throughout the week.  Behavioral modification strategies: emotional eating strategies.  Goal:  Decrease portion size for the next visit by using smaller plates.  Eat mindfully.  Leslie Douglas has agreed to follow-up with our clinic in 2-3 weeks. She was informed of the importance  of frequent follow-up visits to maximize her success with intensive lifestyle modifications for her multiple health conditions.   Objective:   Blood pressure 131/90, pulse 94, temperature 97.8 F (36.6 C), temperature source Oral, height 5' 7"  (1.702 m), weight (!) 304 lb (137.9 kg), SpO2 97 %. Body mass index is 47.61 kg/m.  General: Cooperative, alert, well developed, in no acute distress. HEENT: Conjunctivae and lids unremarkable. Cardiovascular: Regular rhythm.  Lungs: Normal work of breathing. Neurologic: No focal deficits.   Lab Results  Component Value Date   CREATININE 0.85 05/24/2019   BUN 16 05/24/2019   NA 139 05/24/2019   K 4.1 05/24/2019   CL 102 05/24/2019   CO2 32 05/24/2019   Lab Results  Component Value Date   ALT 39 (H) 05/24/2019   AST 28 05/24/2019   ALKPHOS 67 05/24/2019   BILITOT 0.3 05/24/2019   Lab Results  Component Value Date   HGBA1C 5.5 10/20/2017   Lab Results  Component Value Date   INSULIN 18.8 04/12/2019   Lab Results  Component Value Date   TSH 2.32 03/27/2019   Lab Results  Component Value Date   CHOL 224 (H) 10/20/2017   HDL 53 10/20/2017   LDLCALC 142 (H) 10/20/2017   TRIG 147 10/20/2017   CHOLHDL 4.2 10/20/2017   Lab Results  Component Value Date   WBC 7.3 05/24/2019   HGB 12.9 05/24/2019   HCT 38.3 05/24/2019   MCV 91.1 05/24/2019   PLT 383.0 05/24/2019   Lab Results  Component Value Date   IRON 89 09/21/2018   TIBC 346 09/21/2018   FERRITIN 90.3 09/21/2018   Attestation Statements:   Reviewed by clinician on day of visit: allergies, medications, problem list, medical history, surgical history, family history, social history, and previous encounter notes.  I, Water quality scientist, CMA, am acting as transcriptionist for Briscoe Deutscher, DO  I have reviewed the above documentation for accuracy and completeness, and I agree with the above. Briscoe Deutscher, DO

## 2019-10-02 ENCOUNTER — Ambulatory Visit (INDEPENDENT_AMBULATORY_CARE_PROVIDER_SITE_OTHER): Payer: BC Managed Care – PPO | Admitting: Family Medicine

## 2019-10-02 ENCOUNTER — Encounter (INDEPENDENT_AMBULATORY_CARE_PROVIDER_SITE_OTHER): Payer: Self-pay | Admitting: Family Medicine

## 2019-10-02 ENCOUNTER — Other Ambulatory Visit: Payer: Self-pay

## 2019-10-02 VITALS — BP 133/85 | HR 89 | Temp 98.0°F | Ht 67.0 in | Wt 308.0 lb

## 2019-10-02 DIAGNOSIS — Z9189 Other specified personal risk factors, not elsewhere classified: Secondary | ICD-10-CM | POA: Diagnosis not present

## 2019-10-02 DIAGNOSIS — Z6841 Body Mass Index (BMI) 40.0 and over, adult: Secondary | ICD-10-CM

## 2019-10-02 DIAGNOSIS — F5081 Binge eating disorder: Secondary | ICD-10-CM | POA: Diagnosis not present

## 2019-10-02 DIAGNOSIS — E65 Localized adiposity: Secondary | ICD-10-CM

## 2019-10-02 DIAGNOSIS — F419 Anxiety disorder, unspecified: Secondary | ICD-10-CM

## 2019-10-02 MED ORDER — VYVANSE 40 MG PO CHEW: 1.0000 | CHEWABLE_TABLET | Freq: Every day | ORAL | 0 refills | Status: DC

## 2019-10-03 NOTE — Progress Notes (Signed)
Chief Complaint:   OBESITY Leslie Douglas is here to discuss her progress with her obesity treatment plan along with follow-up of her obesity related diagnoses. Leslie Douglas is on practicing portion control and making smarter food choices, such as increasing vegetables and decreasing simple carbohydrates and states she is following her eating plan approximately 0% of the time. Leslie Douglas states she is walking for 15 minutes 3 times per week.  Today's visit was #: 8 Starting weight: 297 lbs Starting date: 04/12/2019 Today's weight: 308 lbs Today's date: 10/02/2019 Total lbs lost to date: 0 Total lbs lost since last in-office visit: 0  Interim History: Today's bioimpedance results indicate that Leslie Douglas has gained 4 pounds of water weight since her last visit.  She was previously going for [facility] at [facility].  DBT for BPD.  She has seen Trey Paula as her general counselor.  She endorses less binges.  Assessment/Plan:   1. Visceral obesity Not at goal. Visceral adipose tissue is a hormonally active component of total body fat. This body composition phenotype is associated with medical disorders such as metabolic syndrome, cardiovascular disease and several malignancies including prostate, breast, and colorectal cancers. Goal: Lose 7-10% of starting weight. Visceral fat rating should be < 13.  Current rating is 20.  2. Binge eating disorder Leslie Douglas meets binge eating disorder (BED) requirements, including: binging 4-13 times a week , eating until feeling uncomfortably full, eating large amounts of food when not feeling physically full, eating alone because of feeling embarrassed by how much you are eating, feeling disgusted and depressed or guilty about binge eating.   She has been working on decreasing binge episodes and feels that she has been successful. Leslie Douglas is taking Vyvanse 40 mg chew daily for BED.  Will refill today, as per below.  - Refill Lisdexamfetamine Dimesylate (VYVANSE) 40  MG CHEW; Chew 1 tablet by mouth daily.  Dispense: 30 tablet; Refill: 0  I have consulted the [place] Controlled Substances Registry for this patient, and feel the risk/benefit ratio today is favorable for proceeding with this prescription for a controlled substance. The patient understands monitoring parameters and red flags.   Counseling: People who binge eat feel as if they don't have control over how much they eat and have feelings of guilt or self-loathing after a binge eating episode. [place] estimates that about 30 percent of adults with binge eating disorder also have a history of ADHD. Binge eating and related obesity can underlie health problems like heart disease and diabetes. The FDA has approved Vyvanse as a treatment option for both ADHD and binge eating. Vyvanse targets the brain's reward center by increasing the levels of dopamine and norepinephrine, the chemicals of the brain responsible for feelings of pleasure. Mindful eating is the recommended nutritional approach to treating BED.   3. Anxiety Her anxiety has decreased since she started working from home.  Behavior modification techniques were discussed today to help Leslie Douglas deal with her anxiety. Will continue to monitor symptoms as they relate to her weight loss journey.  4. At risk for hypertension Leslie Douglas was given approximately 15 minutes of hypertension prevention counseling today. Leslie Douglas is at risk for hypertension due to obesity and Vyvanse. We discussed intensive lifestyle modifications today with an emphasis on weight loss as well as increasing exercise and decreasing salt intake.  5. Class 3 severe obesity with serious comorbidity and body mass index (BMI) of 45.0 to 49.9 in adult, unspecified obesity type ([place]) Leslie Douglas is currently in  the action stage of change. As such, her goal is to continue with weight loss efforts. She has agreed to practicing portion control and making smarter food choices, such as  increasing vegetables and decreasing simple carbohydrates.   Exercise goals: For substantial health benefits, adults should do at least 150 minutes (2 hours and 30 minutes) a week of moderate-intensity, or 75 minutes (1 hour and 15 minutes) a week of vigorous-intensity aerobic physical activity, or an equivalent combination of moderate- and vigorous-intensity aerobic activity. Aerobic activity should be performed in episodes of at least 10 minutes, and preferably, it should be spread throughout the week.  Behavioral modification strategies: increasing water intake and emotional eating strategies.  Leslie Douglas has agreed to follow-up with our clinic in 3 weeks. She was informed of the importance of frequent follow-up visits to maximize her success with intensive lifestyle modifications for her multiple health conditions.   Objective:   Blood pressure 133/85, pulse 89, temperature 98 F (36.7 C), temperature source Oral, height 5' 7"  (1.702 m), weight (!) 308 lb (139.7 kg), SpO2 99 %. Body mass index is 48.24 kg/m.  General: Cooperative, alert, well developed, in no acute distress. HEENT: Conjunctivae and lids unremarkable. Cardiovascular: Regular rhythm.  Lungs: Normal work of breathing. Neurologic: No focal deficits.   Lab Results  Component Value Date   CREATININE 0.85 05/24/2019   BUN 16 05/24/2019   NA 139 05/24/2019   K 4.1 05/24/2019   CL 102 05/24/2019   CO2 32 05/24/2019   Lab Results  Component Value Date   ALT 39 (H) 05/24/2019   AST 28 05/24/2019   ALKPHOS 67 05/24/2019   BILITOT 0.3 05/24/2019   Lab Results  Component Value Date   HGBA1C 5.5 10/20/2017   Lab Results  Component Value Date   INSULIN 18.8 04/12/2019   Lab Results  Component Value Date   TSH 2.32 03/27/2019   Lab Results  Component Value Date   CHOL 224 (H) 10/20/2017   HDL 53 10/20/2017   LDLCALC 142 (H) 10/20/2017   TRIG 147 10/20/2017   CHOLHDL 4.2 10/20/2017   Lab Results  Component  Value Date   WBC 7.3 05/24/2019   HGB 12.9 05/24/2019   HCT 38.3 05/24/2019   MCV 91.1 05/24/2019   PLT 383.0 05/24/2019   Lab Results  Component Value Date   IRON 89 09/21/2018   TIBC 346 09/21/2018   FERRITIN 90.3 09/21/2018   Attestation Statements:   Reviewed by clinician on day of visit: allergies, medications, problem list, medical history, surgical history, family history, social history, and previous encounter notes.  I, Water quality scientist, CMA, am acting as transcriptionist for Briscoe Deutscher, DO  I have reviewed the above documentation for accuracy and completeness, and I agree with the above. Briscoe Deutscher, DO

## 2019-10-23 ENCOUNTER — Other Ambulatory Visit: Payer: Self-pay

## 2019-10-23 ENCOUNTER — Encounter (INDEPENDENT_AMBULATORY_CARE_PROVIDER_SITE_OTHER): Payer: Self-pay | Admitting: Family Medicine

## 2019-10-23 ENCOUNTER — Ambulatory Visit (INDEPENDENT_AMBULATORY_CARE_PROVIDER_SITE_OTHER): Payer: BC Managed Care – PPO | Admitting: Family Medicine

## 2019-10-23 VITALS — BP 137/88 | HR 94 | Temp 98.0°F | Ht 67.0 in | Wt 311.0 lb

## 2019-10-23 DIAGNOSIS — F5081 Binge eating disorder: Secondary | ICD-10-CM

## 2019-10-23 DIAGNOSIS — E559 Vitamin D deficiency, unspecified: Secondary | ICD-10-CM

## 2019-10-23 DIAGNOSIS — Z6841 Body Mass Index (BMI) 40.0 and over, adult: Secondary | ICD-10-CM

## 2019-10-23 DIAGNOSIS — R6889 Other general symptoms and signs: Secondary | ICD-10-CM

## 2019-10-24 ENCOUNTER — Encounter: Payer: BC Managed Care – PPO | Admitting: Physician Assistant

## 2019-10-24 NOTE — Progress Notes (Signed)
Chief Complaint:   OBESITY Leslie Douglas is here to discuss her progress with her obesity treatment plan along with follow-up of her obesity related diagnoses. [name] is on practicing portion control and making smarter food choices, such as increasing vegetables and decreasing simple carbohydrates and states she is following her eating plan approximately 0% of the time. [name] states she is walking for 10 minutes 1-2 times per week.  Today's visit was #: 9 Starting weight: 297 lbs Starting date: 04/12/2019 Today's weight: 311 lbs Today's date: 10/23/2019 Total lbs lost to date: 0 Total lbs lost since last in-office visit: +3 lbs  Interim History: [name] has been under increased stress and has been binge eating more often lately.    Assessment/Plan:   1. Vitamin D deficiency Current vitamin D is 24.6, tested on 04/12/2019. Not at goal. Optimal goal > 50 ng/dL.   Plan: Continue Vitamin D @50 ,000 IU every week with follow-up for routine testing of Vitamin D at least 2-3 times per year to avoid over-replacement.  -Refill Vitamin D, Ergocalciferol, (DRISDOL) 1.25 MG (50000 UNIT) CAPS capsule; Take 1 capsule (50,000 Units total) by mouth every 7 (seven) days.  Dispense: 4 capsule; Refill: 0  2. Binge eating disorder Vyvanse has been very helpful to [name], especially during the day.  We will continue with this medication.  However, to decrease her drive for food as reward, we will retry Saxenda.  [name] would benefit from counseling for her disordered eating patterns.  Unfortunately, she has not been able to afford the specific groups available so far. She says that [facility] was too expensive.  [facility] is out of network.  People who binge eat feel as if they don't have control over how much they eat and have feelings of guilt or self-loathing after a binge eating episode. Binge eating and related obesity can underlie health problems like heart disease and diabetes. The FDA has  approved Vyvanse as a treatment option for both ADHD and binge eating. Vyvanse targets the brain's reward center by increasing the levels of dopamine and norepinephrine, the chemicals of the brain responsible for feelings of pleasure. Mindful eating is the recommended nutritional approach to treating BED.   - Increase lisdexamfetamine (VYVANSE) 60 MG capsule; Take 1 capsule (60 mg total) by mouth every morning.  Dispense: 30 capsule; Refill: 0  I have consulted the [place] Controlled Substances Registry for this patient, and feel the risk/benefit ratio today is favorable for proceeding with this prescription for a controlled substance. The patient understands monitoring parameters and red flags.   3. Episodes of decreased attentiveness [employer] estimates that about 30 percent of adults with binge eating disorder also have a history of ADHD. [name] will be referred to [facility] today. We will continue to monitor symptoms as they relate to her weight loss journey.  - Ambulatory referral to Psychiatry  4. Class 3 severe obesity with serious comorbidity and body mass index (BMI) of 45.0 to 49.9 in adult, unspecified obesity type ([place])  [name] is currently in the action stage of change. As such, her goal is to continue with weight loss efforts. She has agreed to practicing portion control and making smarter food choices, such as increasing vegetables and decreasing simple carbohydrates.   Exercise goals: All adults should avoid inactivity. Some physical activity is better than none, and adults who participate in any amount of physical activity gain some health benefits.  Behavioral modification strategies: increasing lean protein intake, decreasing simple  carbohydrates and increasing vegetables.  [name] has agreed to follow-up with our clinic in 3 weeks. She was informed of the importance of frequent follow-up visits to maximize her success with intensive lifestyle  modifications for her multiple health conditions.   Objective:   Blood pressure 137/88, pulse 94, temperature 98 F (36.7 C), temperature source Oral, height 5' 7"  (1.702 m), weight (!) 311 lb (141.1 kg), SpO2 99 %. Body mass index is 48.71 kg/m.  General: Cooperative, alert, well developed, in no acute distress. HEENT: Conjunctivae and lids unremarkable. Cardiovascular: Regular rhythm.  Lungs: Normal work of breathing. Neurologic: No focal deficits.   Lab Results  Component Value Date   CREATININE 0.85 05/24/2019   BUN 16 05/24/2019   NA 139 05/24/2019   K 4.1 05/24/2019   CL 102 05/24/2019   CO2 32 05/24/2019   Lab Results  Component Value Date   ALT 39 (H) 05/24/2019   AST 28 05/24/2019   ALKPHOS 67 05/24/2019   BILITOT 0.3 05/24/2019   Lab Results  Component Value Date   HGBA1C 5.5 10/20/2017   Lab Results  Component Value Date   INSULIN 18.8 04/12/2019   Lab Results  Component Value Date   TSH 2.32 03/27/2019   Lab Results  Component Value Date   CHOL 224 (H) 10/20/2017   HDL 53 10/20/2017   LDLCALC 142 (H) 10/20/2017   TRIG 147 10/20/2017   CHOLHDL 4.2 10/20/2017   Lab Results  Component Value Date   WBC 7.3 05/24/2019   HGB 12.9 05/24/2019   HCT 38.3 05/24/2019   MCV 91.1 05/24/2019   PLT 383.0 05/24/2019   Lab Results  Component Value Date   IRON 89 09/21/2018   TIBC 346 09/21/2018   FERRITIN 90.3 09/21/2018   Attestation Statements:   Reviewed by clinician on day of visit: allergies, medications, problem list, medical history, surgical history, family history, social history, and previous encounter notes.  Time spent on visit including pre-visit chart review and post-visit care and charting was 45 minutes.   I, Water quality scientist, CMA, am acting as transcriptionist for Briscoe Deutscher, DO  I have reviewed the above documentation for accuracy and completeness, and I agree with the above. Briscoe Deutscher, DO

## 2019-10-25 MED ORDER — VITAMIN D (ERGOCALCIFEROL) 1.25 MG (50000 UNIT) PO CAPS: 50000.0000 [IU] | ORAL_CAPSULE | ORAL | 0 refills | Status: DC

## 2019-10-25 MED ORDER — LISDEXAMFETAMINE DIMESYLATE 60 MG PO CAPS: 60.0000 mg | ORAL_CAPSULE | ORAL | 0 refills | Status: DC

## 2019-10-29 ENCOUNTER — Telehealth (INDEPENDENT_AMBULATORY_CARE_PROVIDER_SITE_OTHER): Payer: Self-pay

## 2019-10-29 NOTE — Telephone Encounter (Signed)
Pt called about her medication VYVANSE that ws called into [facility] [address] needs to be into tablets that she can break into 3's, instead of capsules. Please give pt a call.

## 2019-10-31 ENCOUNTER — Encounter: Payer: Self-pay | Admitting: Physician Assistant

## 2019-10-31 ENCOUNTER — Other Ambulatory Visit: Payer: Self-pay

## 2019-10-31 ENCOUNTER — Ambulatory Visit (INDEPENDENT_AMBULATORY_CARE_PROVIDER_SITE_OTHER): Payer: BC Managed Care – PPO | Admitting: Physician Assistant

## 2019-10-31 VITALS — BP 130/80 | HR 97 | Temp 97.8°F | Ht 66.5 in | Wt 313.0 lb

## 2019-10-31 DIAGNOSIS — K5100 Ulcerative (chronic) pancolitis without complications: Secondary | ICD-10-CM

## 2019-10-31 DIAGNOSIS — Z23 Encounter for immunization: Secondary | ICD-10-CM

## 2019-10-31 DIAGNOSIS — Z13220 Encounter for screening for lipoid disorders: Secondary | ICD-10-CM

## 2019-10-31 DIAGNOSIS — F418 Other specified anxiety disorders: Secondary | ICD-10-CM

## 2019-10-31 DIAGNOSIS — Z136 Encounter for screening for cardiovascular disorders: Secondary | ICD-10-CM

## 2019-10-31 DIAGNOSIS — Z6841 Body Mass Index (BMI) 40.0 and over, adult: Secondary | ICD-10-CM

## 2019-10-31 DIAGNOSIS — Z0000 Encounter for general adult medical examination without abnormal findings: Secondary | ICD-10-CM | POA: Diagnosis not present

## 2019-10-31 DIAGNOSIS — F5081 Binge eating disorder: Secondary | ICD-10-CM

## 2019-10-31 DIAGNOSIS — F603 Borderline personality disorder: Secondary | ICD-10-CM | POA: Diagnosis not present

## 2019-10-31 MED ORDER — VYVANSE 60 MG PO CHEW: 1.0000 | CHEWABLE_TABLET | Freq: Every day | ORAL | 0 refills | Status: DC

## 2019-10-31 NOTE — Telephone Encounter (Signed)
Please advise 

## 2019-10-31 NOTE — Telephone Encounter (Signed)
Corrected and resent.

## 2019-10-31 NOTE — Patient Instructions (Signed)
It was great to see you!  I will reach out to the [facility] group to contact you, I think they would be perfect for you.  I hope the Vyvanse coupon works.  Please go to the lab for blood work.   Our office will call you with your results unless you have chosen to receive results via MyChart.  If your blood work is normal we will follow-up each year for physicals and as scheduled for chronic medical problems.  If anything is abnormal we will treat accordingly and get you in for a follow-up.  Take care,  [facility] Maintenance, Female Adopting a healthy lifestyle and getting preventive care are important in promoting health and wellness. Ask your health care provider about:  The right schedule for you to have regular tests and exams.  Things you can do on your own to prevent diseases and keep yourself healthy. What should I know about diet, weight, and exercise? Eat a healthy diet   Eat a diet that includes plenty of vegetables, fruits, low-fat dairy products, and lean protein.  Do not eat a lot of foods that are high in solid fats, added sugars, or sodium. Maintain a healthy weight Body mass index (BMI) is used to identify weight problems. It estimates body fat based on height and weight. Your health care provider can help determine your BMI and help you achieve or maintain a healthy weight. Get regular exercise Get regular exercise. This is one of the most important things you can do for your health. Most adults should:  Exercise for at least 150 minutes each week. The exercise should increase your heart rate and make you sweat (moderate-intensity exercise).  Do strengthening exercises at least twice a week. This is in addition to the moderate-intensity exercise.  Spend less time sitting. Even light physical activity can be beneficial. Watch cholesterol and blood lipids Have your blood tested for lipids and cholesterol at 56 years of age, then have this test  every 5 years. Have your cholesterol levels checked more often if:  Your lipid or cholesterol levels are high.  You are older than 56 years of age.  You are at high risk for heart disease. What should I know about cancer screening? Depending on your health history and family history, you may need to have cancer screening at various ages. This may include screening for:  Breast cancer.  Cervical cancer.  Colorectal cancer.  Skin cancer.  Lung cancer. What should I know about heart disease, diabetes, and high blood pressure? Blood pressure and heart disease  High blood pressure causes heart disease and increases the risk of stroke. This is more likely to develop in people who have high blood pressure readings, are of African descent, or are overweight.  Have your blood pressure checked: ? Every 3-5 years if you are 51-5 years of age. ? Every year if you are 52 years old or older. Diabetes Have regular diabetes screenings. This checks your fasting blood sugar level. Have the screening done:  Once every three years after age 56 if you are at a normal weight and have a low risk for diabetes.  More often and at a younger age if you are overweight or have a high risk for diabetes. What should I know about preventing infection? Hepatitis B If you have a higher risk for hepatitis B, you should be screened for this virus. Talk with your health care provider to find out if you are at  risk for hepatitis B infection. Hepatitis C Testing is recommended for:  Everyone born from [birth year] through 1967.  Anyone with known risk factors for hepatitis C. Sexually transmitted infections (STIs)  Get screened for STIs, including gonorrhea and chlamydia, if: ? You are sexually active and are younger than 56 years of age. ? You are older than 56 years of age and your health care provider tells you that you are at risk for this type of infection. ? Your sexual activity has changed since you were  last screened, and you are at increased risk for chlamydia or gonorrhea. Ask your health care provider if you are at risk.  Ask your health care provider about whether you are at high risk for HIV. Your health care provider may recommend a prescription medicine to help prevent HIV infection. If you choose to take medicine to prevent HIV, you should first get tested for HIV. You should then be tested every 3 months for as long as you are taking the medicine. Pregnancy  If you are about to stop having your period (premenopausal) and you may become pregnant, seek counseling before you get pregnant.  Take 400 to 800 micrograms (mcg) of folic acid every day if you become pregnant.  Ask for birth control (contraception) if you want to prevent pregnancy. Osteoporosis and menopause Osteoporosis is a disease in which the bones lose minerals and strength with aging. This can result in bone fractures. If you are 50 years old or older, or if you are at risk for osteoporosis and fractures, ask your health care provider if you should:  Be screened for bone loss.  Take a calcium or vitamin D supplement to lower your risk of fractures.  Be given hormone replacement therapy (HRT) to treat symptoms of menopause. Follow these instructions at home: Lifestyle  Do not use any products that contain nicotine or tobacco, such as cigarettes, e-cigarettes, and chewing tobacco. If you need help quitting, ask your health care provider.  Do not use street drugs.  Do not share needles.  Ask your health care provider for help if you need support or information about quitting drugs. Alcohol use  Do not drink alcohol if: ? Your health care provider tells you not to drink. ? You are pregnant, may be pregnant, or are planning to become pregnant.  If you drink alcohol: ? Limit how much you use to 0-1 drink a day. ? Limit intake if you are breastfeeding.  Be aware of how much alcohol is in your drink. In the [place]  one drink equals one 12 oz bottle of beer (355 mL), one 5 oz glass of wine (148 mL), or one 1 oz glass of hard liquor (44 mL). General instructions  Schedule regular health, dental, and eye exams.  Stay current with your vaccines.  Tell your health care provider if: ? You often feel depressed. ? You have ever been abused or do not feel safe at home. Summary  Adopting a healthy lifestyle and getting preventive care are important in promoting health and wellness.  Follow your health care provider's instructions about healthy diet, exercising, and getting tested or screened for diseases.  Follow your health care provider's instructions on monitoring your cholesterol and blood pressure. This information is not intended to replace advice given to you by your health care provider. Make sure you discuss any questions you have with your health care provider. Document Revised: 12/21/2017 Document Reviewed: 12/21/2017 Elsevier Patient Education  2020 [employer].

## 2019-10-31 NOTE — Progress Notes (Signed)
I acted as a Education administrator for [employer], PA-C Anselmo Pickler, LPN    Subjective:    Leslie Douglas is a 54 y.o. female and is here for a comprehensive physical exam.   HPI  Health Maintenance Due  Topic Date Due  . INFLUENZA VACCINE  08/12/2019    Acute Concerns:   Chronic Issues: BED -- continues to work with [facility] on this. She is taking Vyvanse, but it is quite expensive for her. She denies significant concerns with her symptoms at this time. Anxiety, Depression, BPD -- continues to work with Dr. Daron Offer on these dx. Currently stable on medication -- Abilify 5 mg, Lamictal 150 mg, Propranolol 10 mg prn. Ulcerative chronic pancolitis -- continues to work with GI on this. Currently on mesalamine suppositories, but planning to follow-up with GI and potentially consider immunomodulators or biologics.  Health Maintenance: Immunizations -- UTD Colonoscopy -- UTD, due 04/2020 Mammogram -- due 11/2019 PAP -- UTD, due 10/2020 Bone Density -- N/A Diet -- working with weight management  Sleep habits -- takes amitriptyline at night but sometimes does not help her stay asleep Exercise -- working on trying to increase this Weight -- Weight: (!) 313 lb (142 kg)  Mood -- struggling with binge-eating disorder right now which is greatly affecting her mood Weight history: Wt Readings from Last 10 Encounters:  10/31/19 (!) 313 lb (142 kg)  10/23/19 (!) 311 lb (141.1 kg)  10/02/19 (!) 308 lb (139.7 kg)  09/11/19 (!) 304 lb (137.9 kg)  08/16/19 300 lb (136.1 kg)  07/25/19 300 lb (136.1 kg)  07/03/19 (!) 302 lb (137 kg)  05/24/19 (!) 300 lb 6.4 oz (136.3 kg)  05/21/19 294 lb (133.4 kg)  05/08/19 295 lb (133.8 kg)   Body mass index is 49.76 kg/m. No LMP recorded. Patient is postmenopausal. Alcohol use: rare Tobacco use: none  Depression screen PHQ 2/9 10/31/2019  Decreased Interest 3  Down, Depressed, Hopeless 3  PHQ - 2 Score 6  Altered sleeping 1  Tired, decreased energy 2    Change in appetite 3  Feeling bad or failure about yourself  2  Trouble concentrating 3  Moving slowly or fidgety/restless 0  Suicidal thoughts 0  PHQ-9 Score 17  Difficult doing work/chores Somewhat difficult     Other providers/specialists: Patient Care Team: Inda Coke, [place] as PCP - General (Physician Assistant) Valentina Shaggy, MD as Consulting Physician (Allergy and Immunology) Mansouraty, Telford Nab., MD as Consulting Physician (Gastroenterology) Cameron Sprang, MD as Consulting Physician (Neurology)    PMHx, SurgHx, SocialHx, Medications, and Allergies were reviewed in the Visit Navigator and updated as appropriate.   Past Medical History:  Diagnosis Date  . Allergy   . Anxiety   . Back pain   . Binge eating disorder   . Borderline personality disorder ([place])   . COPD (chronic obstructive pulmonary disease) (HCC)    chronic bronchitis  . Depression   . Edema, lower extremity   . Hip pain   . History of chicken pox   . Joint pain   . Memory loss   . Pancolitis ([place])   . Poor concentration   . Seasonal allergies   . Skin cancer, basal cell    basal cell  . Tinnitus   . Vitamin D deficiency      Past Surgical History:  Procedure Laterality Date  . CHOLECYSTECTOMY  2003  . MOHS SURGERY  10/2017  . VAGINAL DELIVERY  1995  . WISDOM TOOTH  EXTRACTION  12/2017     Family History  Problem Relation Age of Onset  . Hypertension Mother   . Diabetes Father   . Colon cancer Neg Hx   . Esophageal cancer Neg Hx   . Rectal cancer Neg Hx   . Stomach cancer Neg Hx   . Pancreatic cancer Neg Hx   . Inflammatory bowel disease Neg Hx   . Liver disease Neg Hx   . Allergic rhinitis Neg Hx   . Angioedema Neg Hx   . Asthma Neg Hx   . Eczema Neg Hx   . Urticaria Neg Hx   . Immunodeficiency Neg Hx   . Atopy Neg Hx     Social History   Tobacco Use  . Smoking status: Never Smoker  . Smokeless tobacco: Never Used  Vaping Use  . Vaping Use: Never used   Substance Use Topics  . Alcohol use: Yes    Comment: rarely  . Drug use: Never    Review of Systems:   Review of Systems  Constitutional: Positive for malaise/fatigue. Negative for chills, fever and weight loss.  HENT: Negative.  Negative for hearing loss, sinus pain and sore throat.   Eyes: Negative.  Negative for blurred vision.  Respiratory: Negative.  Negative for cough and shortness of breath.   Cardiovascular: Negative.  Negative for chest pain, palpitations and leg swelling.  Gastrointestinal: Positive for diarrhea (Chronic). Negative for abdominal pain, constipation, heartburn, nausea and vomiting.  Genitourinary: Negative.  Negative for dysuria, frequency and urgency.  Musculoskeletal: Negative.  Negative for back pain, myalgias and neck pain.  Skin: Negative.  Negative for itching and rash.  Neurological: Negative.  Negative for dizziness, tingling, seizures, loss of consciousness and headaches.  Endo/Heme/Allergies: Negative.  Negative for polydipsia.  Psychiatric/Behavioral: Positive for depression. The patient is nervous/anxious.     Objective:   BP 130/80 (BP Location: Left Arm, Patient Position: Sitting, Cuff Size: Large)   Pulse 97   Temp 97.8 F (36.6 C) (Temporal)   Ht 5' 6.5" (1.689 m)   Wt (!) 313 lb (142 kg)   SpO2 98%   BMI 49.76 kg/m  Body mass index is 49.76 kg/m.   General Appearance:    Alert, cooperative, no distress, appears stated age  Head:    Normocephalic, without obvious abnormality, atraumatic  Eyes:    PERRL, conjunctiva/corneas clear, EOM's intact, fundi    benign, both eyes  Ears:    Normal TM's and external ear canals, both ears  Nose:   Nares normal, septum midline, mucosa normal, no drainage    or sinus tenderness  Throat:   Lips, mucosa, and tongue normal; teeth and gums normal  Neck:   Supple, symmetrical, trachea midline, no adenopathy;    thyroid:  no enlargement/tenderness/nodules; no carotid   bruit or JVD  Back:      Symmetric, no curvature, ROM normal, no CVA tenderness  Lungs:     Clear to auscultation bilaterally, respirations unlabored  Chest Wall:    No tenderness or deformity   Heart:    Regular rate and rhythm, S1 and S2 normal, no murmur, rub or gallop  Breast Exam:    Deferred  Abdomen:     Soft, non-tender, bowel sounds active all four quadrants,    no masses, no organomegaly  Genitalia:    Deferred  Extremities:   Deferred  Pulses:   2+ and symmetric all extremities  Skin:   Skin color, texture, turgor normal, no rashes or  lesions  Lymph nodes:   Cervical, supraclavicular, and axillary nodes normal  Neurologic:   CNII-XII intact, normal strength, sensation and reflexes    throughout     Assessment/Plan:   Marylon was seen today for annual exam.  Diagnoses and all orders for this visit:  Routine physical examination Today patient counseled on age appropriate routine health concerns for screening and prevention, each reviewed and up to date or declined. Immunizations reviewed and up to date or declined. Labs ordered and reviewed. Risk factors for depression reviewed and negative. Hearing function and visual acuity are intact. ADLs screened and addressed as needed. Functional ability and level of safety reviewed and appropriate. Education, counseling and referrals performed based on assessed risks today. Patient provided with a copy of personalized plan for preventive services.  Binge eating disorder Continues to work on this. She is very interested in counseling -- will refer to [facility]. I have given her information about this and also have sent in referral. -     CBC with Differential/Platelet; Future -     Comprehensive metabolic panel; Future -     Comprehensive metabolic panel -     CBC with Differential/Platelet  Borderline personality disorder ([place]); Anxiety with depression Mgmt per psych.  Class 3 severe obesity with serious comorbidity and body mass index  (BMI) of 45.0 to 49.9 in adult, unspecified obesity type ([place]) Mgmt per MWM. Encouraged efforts.  Encounter for lipid screening for cardiovascular disease -     Lipid panel; Future -     Lipid panel  Ulcerative chronic pancolitis without complications (HCC) Mgmt per GI.   Well Adult Exam: Labs ordered: Yes. Patient counseling was done. See below for items discussed. Discussed the patient's BMI.  The BMI is not in the acceptable range; BMI management plan is completed Follow up in one year. Breast cancer screening: UTD. Cervical cancer screening: UTD   Patient Counseling: [x]    Nutrition: Stressed importance of moderation in sodium/caffeine intake, saturated fat and cholesterol, caloric balance, sufficient intake of fresh fruits, vegetables, fiber, calcium, iron, and 1 mg of folate supplement per day (for females capable of pregnancy).  [x]    Stressed the importance of regular exercise.   [x]    Substance Abuse: Discussed cessation/primary prevention of tobacco, alcohol, or other drug use; driving or other dangerous activities under the influence; availability of treatment for abuse.   [x]    Injury prevention: Discussed safety belts, safety helmets, smoke detector, smoking near bedding or upholstery.   [x]    Sexuality: Discussed sexually transmitted diseases, partner selection, use of condoms, avoidance of unintended pregnancy  and contraceptive alternatives.  [x]    Dental health: Discussed importance of regular tooth brushing, flossing, and dental visits.  [x]    Health maintenance and immunizations reviewed. Please refer to Health maintenance section.   CMA or LPN served as scribe during this visit. History, Physical, and Plan performed by medical provider. The above documentation has been reviewed and is accurate and complete.  Inda Coke, PA-C [place]

## 2019-11-01 LAB — LIPID PANEL
Cholesterol: 241 mg/dL — ABNORMAL HIGH (ref <200)
HDL: 62 mg/dL (ref >=50)
LDL Cholesterol (Calc): 153 mg/dL (calc) — ABNORMAL HIGH
Non-HDL Cholesterol (Calc): 179 mg/dL (calc) — ABNORMAL HIGH (ref <130)
Total CHOL/HDL Ratio: 3.9 (calc) (ref <5.0)
Triglycerides: 139 mg/dL (ref <150)

## 2019-11-01 LAB — CBC WITH DIFFERENTIAL/PLATELET
Absolute Monocytes: 360 cells/uL (ref 200–950)
Basophils Absolute: 29 cells/uL (ref 0–200)
Basophils Relative: 0.5 %
Eosinophils Absolute: 81 cells/uL (ref 15–500)
Eosinophils Relative: 1.4 %
HCT: 38.3 % (ref 35.0–45.0)
Hemoglobin: 13.1 g/dL (ref 11.7–15.5)
Lymphs Abs: 2436 cells/uL (ref 850–3900)
MCH: 31.1 pg (ref 27.0–33.0)
MCHC: 34.2 g/dL (ref 32.0–36.0)
MCV: 91 fL (ref 80.0–100.0)
MPV: 10 fL (ref 7.5–12.5)
Monocytes Relative: 6.2 %
Neutro Abs: 2894 cells/uL (ref 1500–7800)
Neutrophils Relative %: 49.9 %
Platelets: 354 10*3/uL (ref 140–400)
RBC: 4.21 10*6/uL (ref 3.80–5.10)
RDW: 13.1 % (ref 11.0–15.0)
Total Lymphocyte: 42 %
WBC: 5.8 10*3/uL (ref 3.8–10.8)

## 2019-11-01 LAB — COMPREHENSIVE METABOLIC PANEL
AG Ratio: 1.6 (calc) (ref 1.0–2.5)
ALT: 55 U/L — ABNORMAL HIGH (ref 6–29)
AST: 34 U/L (ref 10–35)
Albumin: 4.2 g/dL (ref 3.6–5.1)
Alkaline phosphatase (APISO): 61 U/L (ref 37–153)
BUN: 12 mg/dL (ref 7–25)
CO2: 29 mmol/L (ref 20–32)
Calcium: 9.6 mg/dL (ref 8.6–10.4)
Chloride: 104 mmol/L (ref 98–110)
Creat: 0.92 mg/dL (ref 0.50–1.05)
Globulin: 2.6 g/dL (calc) (ref 1.9–3.7)
Glucose, Bld: 99 mg/dL (ref 65–99)
Potassium: 4.8 mmol/L (ref 3.5–5.3)
Sodium: 141 mmol/L (ref 135–146)
Total Bilirubin: 0.3 mg/dL (ref 0.2–1.2)
Total Protein: 6.8 g/dL (ref 6.1–8.1)

## 2019-11-19 ENCOUNTER — Ambulatory Visit (INDEPENDENT_AMBULATORY_CARE_PROVIDER_SITE_OTHER): Payer: BC Managed Care – PPO | Admitting: Family Medicine

## 2019-11-27 ENCOUNTER — Other Ambulatory Visit: Payer: Self-pay

## 2019-11-27 ENCOUNTER — Ambulatory Visit (INDEPENDENT_AMBULATORY_CARE_PROVIDER_SITE_OTHER): Payer: BC Managed Care – PPO | Admitting: Family Medicine

## 2019-11-27 ENCOUNTER — Encounter (INDEPENDENT_AMBULATORY_CARE_PROVIDER_SITE_OTHER): Payer: Self-pay | Admitting: Family Medicine

## 2019-11-27 VITALS — BP 130/75 | HR 100 | Temp 97.9°F | Ht 67.0 in | Wt 311.0 lb

## 2019-11-27 DIAGNOSIS — R632 Polyphagia: Secondary | ICD-10-CM | POA: Diagnosis not present

## 2019-11-27 DIAGNOSIS — F5081 Binge eating disorder: Secondary | ICD-10-CM

## 2019-11-27 DIAGNOSIS — E559 Vitamin D deficiency, unspecified: Secondary | ICD-10-CM | POA: Diagnosis not present

## 2019-11-27 DIAGNOSIS — Z9189 Other specified personal risk factors, not elsewhere classified: Secondary | ICD-10-CM

## 2019-11-27 DIAGNOSIS — Z6841 Body Mass Index (BMI) 40.0 and over, adult: Secondary | ICD-10-CM

## 2019-11-28 MED ORDER — VITAMIN D (ERGOCALCIFEROL) 1.25 MG (50000 UNIT) PO CAPS: 50000.0000 [IU] | ORAL_CAPSULE | ORAL | 0 refills | Status: DC

## 2019-11-29 NOTE — Progress Notes (Signed)
Chief Complaint:   OBESITY Leslie Douglas is here to discuss her progress with her obesity treatment plan along with follow-up of her obesity related diagnoses.   Today's visit was #: 10 Starting weight: 297 lbs Starting date: 04/12/2019 Today's weight: 311 lbs Today's date: 11/27/2019 Total lbs lost to date: +14 lbs Body mass index is 48.71 kg/m.   Interim History: Leslie Douglas says that just after Thanksgiving, she will be moving back into a house with her son, Ovid Curd (27), in a nice neighborhood in [place].  She has been working from home.  She went on vacation last week.  She says she has been to [facility] and has had 3 visits so far.   Nutrition Plan: practicing portion control and making smarter food choices, such as increasing vegetables and decreasing simple carbohydrates.  Anti-obesity medications: Vyvanse. Reported side effects: None. Hunger is moderately controlled controlled. Cravings are moderately controlled controlled.  Activity: None.  Assessment/Plan:   1. Polyphagia Not controlled. She will continue to focus on protein-rich, low simple carbohydrate foods. We reviewed the importance of hydration, regular exercise for stress reduction, and restorative sleep.  Plan:  Will retrial Saxenda.  She already has this at home.   2. Vitamin D deficiency Not at goal. Current vitamin D is 24.6, tested on 04/12/2019. Optimal goal > 50 ng/dL.   Plan:  [x]   Continue Vitamin D @50 ,000 IU every week. []   Continue home supplement daily. [x]   Follow-up for routine testing of Vitamin D at least 2-3 times per year to avoid over-replacement.  - Refill Vitamin D, Ergocalciferol, (DRISDOL) 1.25 MG (50000 UNIT) CAPS capsule; Take 1 capsule (50,000 Units total) by mouth every 7 (seven) days.  Dispense: 4 capsule; Refill: 0  3. Binge eating disorder Leslie Douglas is taking Vyvanse 60 mg daily.  She has been working with [facility]. Mindful eating is the recommended nutritional approach  to treating BED.   4. At risk for heart disease Leslie Douglas was given approximately 9 minutes of coronary artery disease prevention counseling today. She is 54 y.o. female and has risk factors for heart disease including obesity. We rediscussed intensive lifestyle modifications today with an emphasis on specific weight loss instructions and strategies.   5. Class 3 severe obesity with serious comorbidity and body mass index (BMI) of 45.0 to 49.9 in adult, unspecified obesity type ([place])  Course: Leslie Douglas is currently in the action stage of change. As such, her goal is to continue with weight loss efforts.   Nutrition goals: She has agreed to practicing portion control and making smarter food choices, such as increasing vegetables and decreasing simple carbohydrates.   Exercise goals: All adults should avoid inactivity. Some physical activity is better than none, and adults who participate in any amount of physical activity gain some health benefits.  Behavioral modification strategies: emotional eating strategies.  Valera has agreed to follow-up with our clinic in 3 weeks. She was informed of the importance of frequent follow-up visits to maximize her success with intensive lifestyle modifications for her multiple health conditions.   Objective:   Blood pressure 130/75, pulse 100, temperature 97.9 F (36.6 C), height 5' 7"  (1.702 m), weight (!) 311 lb (141.1 kg), SpO2 99 %. Body mass index is 48.71 kg/m.  General: Cooperative, alert, well developed, in no acute distress. HEENT: Conjunctivae and lids unremarkable. Cardiovascular: Regular rhythm.  Lungs: Normal work of breathing. Neurologic: No focal deficits.   Lab Results  Component Value Date   CREATININE 0.92 10/31/2019  BUN 12 10/31/2019   NA 141 10/31/2019   K 4.8 10/31/2019   CL 104 10/31/2019   CO2 29 10/31/2019   Lab Results  Component Value Date   ALT 55 (H) 10/31/2019   AST 34 10/31/2019   ALKPHOS 67 05/24/2019    BILITOT 0.3 10/31/2019   Lab Results  Component Value Date   HGBA1C 5.5 10/20/2017   Lab Results  Component Value Date   INSULIN 18.8 04/12/2019   Lab Results  Component Value Date   TSH 2.32 03/27/2019   Lab Results  Component Value Date   CHOL 241 (H) 10/31/2019   HDL 62 10/31/2019   LDLCALC 153 (H) 10/31/2019   TRIG 139 10/31/2019   CHOLHDL 3.9 10/31/2019   Lab Results  Component Value Date   WBC 5.8 10/31/2019   HGB 13.1 10/31/2019   HCT 38.3 10/31/2019   MCV 91.0 10/31/2019   PLT 354 10/31/2019   Lab Results  Component Value Date   IRON 89 09/21/2018   TIBC 346 09/21/2018   FERRITIN 90.3 09/21/2018   Attestation Statements:   Reviewed by clinician on day of visit: allergies, medications, problem list, medical history, surgical history, family history, social history, and previous encounter notes.  I, Water quality scientist, CMA, am acting as transcriptionist for Briscoe Deutscher, DO  I have reviewed the above documentation for accuracy and completeness, and I agree with the above. Briscoe Deutscher, DO

## 2019-12-03 ENCOUNTER — Encounter (INDEPENDENT_AMBULATORY_CARE_PROVIDER_SITE_OTHER): Payer: Self-pay | Admitting: Family Medicine

## 2019-12-18 ENCOUNTER — Ambulatory Visit (INDEPENDENT_AMBULATORY_CARE_PROVIDER_SITE_OTHER): Payer: BC Managed Care – PPO | Admitting: Family Medicine

## 2019-12-22 ENCOUNTER — Encounter: Payer: Self-pay | Admitting: Physician Assistant

## 2019-12-23 ENCOUNTER — Encounter (INDEPENDENT_AMBULATORY_CARE_PROVIDER_SITE_OTHER): Payer: Self-pay | Admitting: Family Medicine

## 2020-05-07 ENCOUNTER — Ambulatory Visit (INDEPENDENT_AMBULATORY_CARE_PROVIDER_SITE_OTHER): Payer: No Typology Code available for payment source | Admitting: Psychology

## 2020-05-07 DIAGNOSIS — F331 Major depressive disorder, recurrent, moderate: Secondary | ICD-10-CM | POA: Diagnosis not present

## 2020-05-07 DIAGNOSIS — F603 Borderline personality disorder: Secondary | ICD-10-CM

## 2020-05-07 DIAGNOSIS — F411 Generalized anxiety disorder: Secondary | ICD-10-CM

## 2020-05-11 HISTORY — PX: SKIN SURGERY: SHX2413

## 2020-06-02 ENCOUNTER — Ambulatory Visit (INDEPENDENT_AMBULATORY_CARE_PROVIDER_SITE_OTHER): Payer: No Typology Code available for payment source | Admitting: Psychology

## 2020-06-02 DIAGNOSIS — F331 Major depressive disorder, recurrent, moderate: Secondary | ICD-10-CM | POA: Diagnosis not present

## 2020-06-02 DIAGNOSIS — F411 Generalized anxiety disorder: Secondary | ICD-10-CM | POA: Diagnosis not present

## 2020-06-10 ENCOUNTER — Telehealth: Payer: Self-pay | Admitting: Gastroenterology

## 2020-06-12 MED ORDER — MESALAMINE 1.2 G PO TBEC: DELAYED_RELEASE_TABLET | ORAL | 2 refills | Status: DC

## 2020-06-12 NOTE — Telephone Encounter (Signed)
Refill for Mesalamine has been sent to [facility]. Pt has been informed.

## 2020-06-16 ENCOUNTER — Ambulatory Visit: Payer: No Typology Code available for payment source | Admitting: Psychology

## 2020-06-30 ENCOUNTER — Ambulatory Visit: Payer: No Typology Code available for payment source | Admitting: Psychology

## 2020-07-10 ENCOUNTER — Other Ambulatory Visit: Payer: Self-pay | Admitting: Physician Assistant

## 2020-07-10 DIAGNOSIS — Z1231 Encounter for screening mammogram for malignant neoplasm of breast: Secondary | ICD-10-CM

## 2020-07-15 ENCOUNTER — Other Ambulatory Visit (INDEPENDENT_AMBULATORY_CARE_PROVIDER_SITE_OTHER): Payer: No Typology Code available for payment source

## 2020-07-15 ENCOUNTER — Ambulatory Visit: Payer: No Typology Code available for payment source | Admitting: Gastroenterology

## 2020-07-15 ENCOUNTER — Encounter: Payer: Self-pay | Admitting: Gastroenterology

## 2020-07-15 VITALS — BP 140/88 | HR 82 | Ht 67.0 in | Wt 314.1 lb

## 2020-07-15 DIAGNOSIS — Z9049 Acquired absence of other specified parts of digestive tract: Secondary | ICD-10-CM

## 2020-07-15 DIAGNOSIS — K5100 Ulcerative (chronic) pancolitis without complications: Secondary | ICD-10-CM

## 2020-07-15 DIAGNOSIS — R7401 Elevation of levels of liver transaminase levels: Secondary | ICD-10-CM | POA: Diagnosis not present

## 2020-07-15 DIAGNOSIS — K529 Noninfective gastroenteritis and colitis, unspecified: Secondary | ICD-10-CM | POA: Diagnosis not present

## 2020-07-15 DIAGNOSIS — R760 Raised antibody titer: Secondary | ICD-10-CM | POA: Diagnosis not present

## 2020-07-15 LAB — COMPREHENSIVE METABOLIC PANEL
ALT: 39 U/L — ABNORMAL HIGH (ref 0–35)
AST: 28 U/L (ref 0–37)
Albumin: 4.4 g/dL (ref 3.5–5.2)
Alkaline Phosphatase: 65 U/L (ref 39–117)
BUN: 13 mg/dL (ref 6–23)
CO2: 30 mEq/L (ref 19–32)
Calcium: 9.3 mg/dL (ref 8.4–10.5)
Chloride: 101 mEq/L (ref 96–112)
Creatinine, Ser: 0.85 mg/dL (ref 0.40–1.20)
GFR: 76.21 mL/min (ref >60.00)
Glucose, Bld: 85 mg/dL (ref 70–99)
Potassium: 4.3 mEq/L (ref 3.5–5.1)
Sodium: 139 mEq/L (ref 135–145)
Total Bilirubin: 0.4 mg/dL (ref 0.2–1.2)
Total Protein: 7.7 g/dL (ref 6.0–8.3)

## 2020-07-15 LAB — CBC
HCT: 40.1 % (ref 36.0–46.0)
Hemoglobin: 13.7 g/dL (ref 12.0–15.0)
MCHC: 34.2 g/dL (ref 30.0–36.0)
MCV: 88.9 fl (ref 78.0–100.0)
Platelets: 352 10*3/uL (ref 150.0–400.0)
RBC: 4.51 Mil/uL (ref 3.87–5.11)
RDW: 13.3 % (ref 11.5–15.5)
WBC: 7.5 10*3/uL (ref 4.0–10.5)

## 2020-07-15 LAB — SEDIMENTATION RATE: Sed Rate: 19 mm/hr (ref 0–30)

## 2020-07-15 LAB — C-REACTIVE PROTEIN: CRP: <1 mg/dL (ref 0.5–20.0)

## 2020-07-15 MED ORDER — CHOLESTYRAMINE 4 G PO PACK: 4.0000 g | PACK | Freq: Two times a day (BID) | ORAL | 2 refills | Status: DC

## 2020-07-15 MED ORDER — MESALAMINE 1.2 G PO TBEC: DELAYED_RELEASE_TABLET | ORAL | 2 refills | Status: DC

## 2020-07-15 NOTE — Progress Notes (Signed)
[facility]   Primary Care Provider Inda Coke, [place] [address] [place] [address] [phone]  Patient Profile: Leslie Douglas is a 55 y.o. female with a pmh significant for hx of BCC of the skin, Anxiety/MDD, COPD, Unclear Anaphylactoid reactions, status postcholecystectomy, ulcerative pancolitis (diagnosed 2020 as etiology for her chronic diarrhea).  The patient presents to the [facility] for an evaluation and management of problem(s) noted below:  Problem List 1. Ulcerative pancolitis without complication ([place])   2. Chronic diarrhea   3. History abnormal LFTs   4. Positive Anti-Smooth Muscle antibody    5. History of cholecystectomy      History of Present Illness: Please see initial consultation note and progress notes for full details of HPI.  Interval History Today the patient returns for follow-up.  We last saw her little over a year ago.  Overall, she is still experiencing symptoms of increased bowel movements per day.  Continues taking her Lialda.  Stressors of work and life still causing issues.  No blood in her stools.  Her son will be moving out while heading to college in [place] later this year.  She wants to continue to feel better if possible.  IBD diary is as below: BMs per day -between 3-5 Nocturnal BMs -none Blood -none Mucous -none Tenesmus -none Urgency -not often Incomplete evaluation -none Skin Manifestations -recent skin cancer removed on nose Eye Manifestations - no new vision change Joint Manifestations - unchanged joint discomfort  GI Review of Systems Positive as above Negative for pain, nausea, vomiting, dysphagia, odynophagia  Review of Systems General: Denies fevers/chills/unintentional weight loss HEENT: Denies new oral lesions Cardiovascular: Denies chest pain  Pulmonary: Denies shortness of breath Gastroenterological: See HPI Genitourinary: Denies darkened  urine Hematological: Denies easy bruising Dermatological: As per HPI Psychological: Mood is stable Allergy & Immunology: No new allergies Musculoskeletal: No new significant arthralgias   Medications Current Outpatient Medications  Medication Sig Dispense Refill   cholestyramine (QUESTRAN) 4 g packet Take 1 packet (4 g total) by mouth 2 (two) times daily. 60 each 2   ARIPiprazole (ABILIFY) 5 MG tablet Take 5 mg by mouth daily.     Biotin w/ Vitamins C & E (HAIR/SKIN/NAILS PO) Take by mouth.     cholecalciferol (VITAMIN D3) 25 MCG (1000 UT) tablet Take 1,000 Units by mouth daily.     desvenlafaxine (PRISTIQ) 50 MG 24 hr tablet Take 50 mg by mouth daily.     EPINEPHrine (EPIPEN 2-PAK) 0.3 mg/0.3 mL IJ SOAJ injection Inject 0.3 mLs (0.3 mg total) into the muscle as needed for anaphylaxis. 1 each 1   fluticasone (FLONASE) 50 MCG/ACT nasal spray Place 2 sprays into both nostrils daily as needed for allergies or rhinitis. 16 g 5   furosemide (LASIX) 20 MG tablet Take 1 tablet (20 mg total) by mouth daily as needed for fluid. 30 tablet 0   ipratropium (ATROVENT) 0.03 % nasal spray Place 1 spray into both nostrils 4 (four) times daily. Use as directed. 30 mL 5   lamoTRIgine (LAMICTAL) 150 MG tablet Take 150 mg by mouth daily.     levocetirizine (XYZAL) 5 MG tablet Take 1 tablet (5 mg total) by mouth every evening. 30 tablet 5   mesalamine (LIALDA) 1.2 g EC tablet TAKE FOUR TABLETS BY MOUTH DAILY WITH BREAKFAST 90 tablet 2   Multiple Vitamin (MULTIVITAMIN) tablet Take 1 tablet by mouth daily.     ondansetron (ZOFRAN ODT) 8 MG  disintegrating tablet Take 1 tablet (8 mg total) by mouth every 8 (eight) hours as needed for nausea or vomiting. 20 tablet 4   potassium chloride SA (KLOR-CON) 20 MEQ tablet Take 1 tablet (20 mEq total) by mouth daily as needed. 30 tablet 0   propranolol (INDERAL) 10 MG tablet Take 1 tablet by mouth 3 (three) times daily as needed.     No current facility-administered  medications for this visit.    Allergies Allergies  Allergen Reactions   Doxycycline Rash and Swelling    Mouth and face   Erythromycin Shortness Of Breath   Sulfa Antibiotics Shortness Of Breath   Saxenda [Liraglutide -Weight Management] Nausea Only   Penicillins Rash    Histories Past Medical History:  Diagnosis Date   Allergy    Anxiety    Back pain    Binge eating disorder    Borderline personality disorder (HCC)    COPD (chronic obstructive pulmonary disease) (HCC)    chronic bronchitis   Depression    Edema, lower extremity    Hip pain    History of chicken pox    Joint pain    Memory loss    Pancolitis (HCC)    Poor concentration    Seasonal allergies    Skin cancer, basal cell    basal cell   Tinnitus    Vitamin D deficiency    Past Surgical History:  Procedure Laterality Date   CHOLECYSTECTOMY  2003   MOHS SURGERY  10/2017   SKIN SURGERY  05/2020   nose skin cancer   VAGINAL DELIVERY  1995   WISDOM TOOTH EXTRACTION  12/2017   Social History   Socioeconomic History   Marital status: Legally Separated    Spouse name: Not on file   Number of children: 3   Years of education: Not on file   Highest education level: Not on file  Occupational History   Occupation: Accounts Receivable    Comment: [employer]  Tobacco Use   Smoking status: Never   Smokeless tobacco: Never  Vaping Use   Vaping Use: Never used  Substance and Sexual Activity   Alcohol use: Yes    Comment: rarely   Drug use: Never   Sexual activity: Not Currently    Birth control/protection: None  Other Topics Concern   Not on file  Social History Narrative   Divorced   Works for [employer], has Designer, industrial/product   3 children   Right handed    Social Determinants of Radio broadcast assistant Strain: Not on file  Food Insecurity: Not on file  Transportation Needs: Not on file  Physical Activity: Not on file  Stress: Not on file  Social Connections: Not on file   Intimate Partner Violence: Not on file   Family History  Problem Relation Age of Onset   Hypertension Mother    Diabetes Father    Colon cancer Neg Hx    Esophageal cancer Neg Hx    Rectal cancer Neg Hx    Stomach cancer Neg Hx    Pancreatic cancer Neg Hx    Inflammatory bowel disease Neg Hx    Liver disease Neg Hx    Allergic rhinitis Neg Hx    Angioedema Neg Hx    Asthma Neg Hx    Eczema Neg Hx    Urticaria Neg Hx    Immunodeficiency Neg Hx    Atopy Neg Hx    I have reviewed her medical, social, and  family history in detail and updated the electronic medical record as necessary.    PHYSICAL EXAMINATION  BP 140/88   Pulse 82   Ht 5' 7"  (1.702 m)   Wt (!) 314 lb 2 oz (142.5 kg)   SpO2 96%   BMI 49.20 kg/m  GEN: NAD, appears stated age, doesn't appear chronically ill PSYCH: Cooperative, without pressured speech EYE: Conjunctivae pink, sclerae anicteric ENT: Masked CV: Nontachycardic RESP: No audible wheezing GI: NABS, soft, rounded, nontender, without rebound or guarding MSK/EXT: No lower extremity edema SKIN: No jaundice NEURO:  Alert & Oriented x 3, no focal deficits   REVIEW OF DATA  I reviewed the following data at the time of this encounter:  GI Procedures and Studies  Previously reviewed  Laboratory Studies  Reviewed in epic  Imaging Studies  No relevant studies to review   ASSESSMENT  Ms. Hayashi is a 55 y.o. female with a pmh significant for hx of BCC of the skin, Anxiety/MDD, COPD, Unclear Anaphylactoid reactions, status post cholecystectomy, ulcerative pancolitis (diagnosed 2020 as etiology for her chronic diarrhea).  The patient is seen today for evaluation and management of:  1. Ulcerative pancolitis without complication ([place])   2. Chronic diarrhea   3. History abnormal LFTs   4. Positive Anti-Smooth Muscle antibody    5. History of cholecystectomy    The patient is hemodynamically stable.  She is still experiencing diarrhea even in the  setting of her Lialda use.  It is possible we will need to step up her therapy if symptoms persist.  As she has a history of cholecystectomy we will see if bile salt diarrhea could be playing a role so we will initiate cholestyramine therapy.  We discussed potential Biologics/immunomodulators but as a result of her previous history of skin cancers we need to be thoughtful about immunomodulators.  We will obtain some updated labs and inflammatory markers to see how things are going noninvasively.  We are also remembering that she has an elevated anti-smooth muscle antibody with only slightly elevated ALT, thus it is not clear that she has underlying autoimmune hepatitis but we may need to think about this in the future.  Further stool studies will be obtained to rule out exocrine pancreas insufficiency.  We will see her in follow-up.  All patient questions were answered to the best of my ability, and the patient agrees to the aforementioned plan of action with follow-up as indicated.   PLAN  Continue Lialda 4.8 g daily Initiate cholestyramine 4 g twice daily and titrate upwards if some improvement Continue IBD diarrhea Stool studies as outlined below Laboratories as outlined below Consideration of biologic/immunomodulators therapy for step up therapy will be considered Holding on liver biopsy but may need to consider in future   Orders Placed This Encounter  Procedures   Calprotectin, Fecal   CBC   Comp Met (CMET)   Sedimentation rate   C-reactive protein   Pancreatic elastase, fecal     New Prescriptions   CHOLESTYRAMINE (QUESTRAN) 4 G PACKET    Take 1 packet (4 g total) by mouth 2 (two) times daily.   Modified Medications   Modified Medication Previous Medication   MESALAMINE (LIALDA) 1.2 G EC TABLET mesalamine (LIALDA) 1.2 g EC tablet      TAKE FOUR TABLETS BY MOUTH DAILY WITH BREAKFAST    TAKE FOUR TABLETS BY MOUTH DAILY WITH BREAKFAST    Planned Follow Up: No follow-ups on  file.   Total Time in Face-to-Face  and in Coordination of Care for patient including independent/personal interpretation/review of prior testing, medical history, examination, medication adjustment, communicating results with the patient directly, and documentation with the EHR is 30 minutes.   Justice Britain, MD [facility] Office # [phone]

## 2020-07-15 NOTE — Patient Instructions (Addendum)
Your provider has requested that you go to the basement level for lab work before leaving today. Press "B" on the elevator. The lab is located at the first door on the left as you exit the elevator.  Due to recent changes in healthcare laws, you may see the results of your imaging and laboratory studies on MyChart before your provider has had a chance to review them.  We understand that in some cases there may be results that are confusing or concerning to you. Not all laboratory results come back in the same time frame and the provider may be waiting for multiple results in order to interpret others.  Please give [name] 48 hours in order for your provider to thoroughly review all the results before contacting the office for clarification of your results.    We have sent the following medications to your pharmacy for you to pick up at your convenience: Cholestyramine -Take 1 packet twice daily Lialda   Please see hand out regarding advance therapy for UC.   Please keep follow-up in 8 weeks on: 09/19/20 at 3:30pm  If you are age 41 or older, your body mass index should be between 23-30. Your Body mass index is 49.2 kg/m. If this is out of the aforementioned range listed, please consider follow up with your Primary Care Provider.  If you are age 46 or younger, your body mass index should be between 19-25. Your Body mass index is 49.2 kg/m. If this is out of the aformentioned range listed, please consider follow up with your Primary Care Provider.   __________________________________________________________  The [facility] providers would like to encourage you to use [facility] to communicate with providers for non-urgent requests or questions.  Due to long hold times on the telephone, sending your provider a message by [facility] may be a faster and more efficient way to get a response.  Please allow 48 business hours for a response.  Please remember that this is for non-urgent requests.   Thank you for  choosing me and [facility].  Dr. Rush Landmark

## 2020-07-17 ENCOUNTER — Encounter: Payer: Self-pay | Admitting: Gastroenterology

## 2020-07-17 DIAGNOSIS — R7401 Elevation of levels of liver transaminase levels: Secondary | ICD-10-CM | POA: Insufficient documentation

## 2020-07-17 DIAGNOSIS — K5100 Ulcerative (chronic) pancolitis without complications: Secondary | ICD-10-CM | POA: Insufficient documentation

## 2020-07-21 ENCOUNTER — Other Ambulatory Visit: Payer: No Typology Code available for payment source

## 2020-07-21 DIAGNOSIS — R7401 Elevation of levels of liver transaminase levels: Secondary | ICD-10-CM

## 2020-07-21 DIAGNOSIS — K529 Noninfective gastroenteritis and colitis, unspecified: Secondary | ICD-10-CM

## 2020-07-21 DIAGNOSIS — K5100 Ulcerative (chronic) pancolitis without complications: Secondary | ICD-10-CM

## 2020-07-25 LAB — CALPROTECTIN, FECAL: Calprotectin, Fecal: 28 ug/g (ref 0–120)

## 2020-07-26 LAB — PANCREATIC ELASTASE, FECAL: Pancreatic Elastase-1, Stool: 414 mcg/g

## 2020-09-02 ENCOUNTER — Ambulatory Visit
Admission: RE | Admit: 2020-09-02 | Discharge: 2020-09-02 | Disposition: A | Discharge status: Home / Self Care | Payer: No Typology Code available for payment source | Source: Ambulatory Visit

## 2020-09-02 ENCOUNTER — Other Ambulatory Visit: Payer: Self-pay

## 2020-09-02 DIAGNOSIS — Z1231 Encounter for screening mammogram for malignant neoplasm of breast: Secondary | ICD-10-CM

## 2020-09-19 ENCOUNTER — Ambulatory Visit: Payer: No Typology Code available for payment source | Admitting: Gastroenterology

## 2020-10-22 ENCOUNTER — Ambulatory Visit: Payer: No Typology Code available for payment source | Admitting: Gastroenterology

## 2020-10-29 ENCOUNTER — Ambulatory Visit: Payer: No Typology Code available for payment source | Admitting: Gastroenterology

## 2020-10-29 ENCOUNTER — Encounter: Payer: Self-pay | Admitting: Gastroenterology

## 2020-10-29 ENCOUNTER — Other Ambulatory Visit (INDEPENDENT_AMBULATORY_CARE_PROVIDER_SITE_OTHER): Payer: No Typology Code available for payment source

## 2020-10-29 VITALS — BP 138/82 | HR 97 | Ht 67.0 in | Wt 310.0 lb

## 2020-10-29 DIAGNOSIS — K5100 Ulcerative (chronic) pancolitis without complications: Secondary | ICD-10-CM | POA: Diagnosis not present

## 2020-10-29 DIAGNOSIS — Z9049 Acquired absence of other specified parts of digestive tract: Secondary | ICD-10-CM

## 2020-10-29 DIAGNOSIS — K529 Noninfective gastroenteritis and colitis, unspecified: Secondary | ICD-10-CM

## 2020-10-29 DIAGNOSIS — K9089 Other intestinal malabsorption: Secondary | ICD-10-CM | POA: Diagnosis not present

## 2020-10-29 DIAGNOSIS — R152 Fecal urgency: Secondary | ICD-10-CM | POA: Diagnosis not present

## 2020-10-29 LAB — BASIC METABOLIC PANEL
BUN: 13 mg/dL (ref 6–23)
CO2: 31 mEq/L (ref 19–32)
Calcium: 9.2 mg/dL (ref 8.4–10.5)
Chloride: 104 mEq/L (ref 96–112)
Creatinine, Ser: 0.86 mg/dL (ref 0.40–1.20)
GFR: 75 mL/min (ref >60.00)
Glucose, Bld: 89 mg/dL (ref 70–99)
Potassium: 3.9 mEq/L (ref 3.5–5.1)
Sodium: 142 mEq/L (ref 135–145)

## 2020-10-29 LAB — CBC
HCT: 40.3 % (ref 36.0–46.0)
Hemoglobin: 13.4 g/dL (ref 12.0–15.0)
MCHC: 33.3 g/dL (ref 30.0–36.0)
MCV: 89.8 fl (ref 78.0–100.0)
Platelets: 353 10*3/uL (ref 150.0–400.0)
RBC: 4.49 Mil/uL (ref 3.87–5.11)
RDW: 14 % (ref 11.5–15.5)
WBC: 6.8 10*3/uL (ref 4.0–10.5)

## 2020-10-29 LAB — C-REACTIVE PROTEIN: CRP: <1 mg/dL (ref 0.5–20.0)

## 2020-10-29 LAB — SEDIMENTATION RATE: Sed Rate: 9 mm/hr (ref 0–30)

## 2020-10-29 MED ORDER — MESALAMINE 1.2 G PO TBEC: DELAYED_RELEASE_TABLET | ORAL | 2 refills | Status: DC

## 2020-10-29 MED ORDER — CHOLESTYRAMINE 4 G PO PACK: 4.0000 g | PACK | Freq: Two times a day (BID) | ORAL | 2 refills | Status: DC

## 2020-10-29 NOTE — Patient Instructions (Addendum)
We have sent the following medications to your pharmacy for you to pick up at your convenience: Lialada, Cholestyramine   Trial of Benefiber Take 1-2 times daily.   Your provider has requested that you go to the basement level for lab work before leaving today. Press "B" on the elevator. The lab is located at the first door on the left as you exit the elevator.   If you are age 57 or younger, your body mass index should be between 19-25. Your Body mass index is 48.55 kg/m. If this is out of the aformentioned range listed, please consider follow up with your Primary Care Provider.   ________________________________________________________  The [place] providers would like to encourage you to use [facility] to communicate with providers for non-urgent requests or questions.  Due to long hold times on the telephone, sending your provider a message by [facility] may be a faster and more efficient way to get a response.  Please allow 48 business hours for a response.  Please remember that this is for non-urgent requests.  _______________________________________________________   Due to recent changes in healthcare laws, you may see the results of your imaging and laboratory studies on MyChart before your provider has had a chance to review them.  We understand that in some cases there may be results that are confusing or concerning to you. Not all laboratory results come back in the same time frame and the provider may be waiting for multiple results in order to interpret others.  Please give [name] 48 hours in order for your provider to thoroughly review all the results before contacting the office for clarification of your results.   Please keep follow up : 01/07/21 @ 3:30pm  Thank you for choosing me and [facility].  Dr. Rush Landmark

## 2020-10-29 NOTE — Progress Notes (Signed)
Erda VISIT   Primary Care Provider Inda Coke, [place] [address] [place] [address] [phone]  Patient Profile: Leslie Douglas is a 55 y.o. female with a pmh significant for hx of BCC of the skin, Anxiety/MDD, COPD, Unclear Anaphylactoid reactions, status postcholecystectomy, ulcerative pancolitis (diagnosed 2020 as etiology for her chronic diarrhea).  The patient presents to the [facility] for an evaluation and management of problem(s) noted below:  Problem List 1. Ulcerative pancolitis without complication ([place])   2. IBD (inflammatory bowel disease)   3. Chronic colitis   4. Chronic diarrhea   5. Fecal urgency   6. History of cholecystectomy     History of Present Illness: Please see prior notes for full details of HPI.  Interval History Today, the patient returns for follow-up.  When I saw her back in July we decided to initiate her on cholestyramine to see if bile salt diarrhea was playing any role in her symptoms.  She initially had some benefit by using this and was using it up to twice daily but subsequently describes a "GI illness" where she has now not had any improvement for quite a while and stopped taking the cholestyramine for at least the last month.  She has continued to think about potential biologic therapy for her since she has had chronic colitis on sampling last year.  She still has issues with stress and anxiety.  She is hopeful to feel better.  She continues taking her Lialda daily.    Her current IBD diary is as below: BMs per day -between 3-6 Nocturnal BMs -none Blood -none Mucous -infrequently Tenesmus -none Urgency -not often Incomplete evaluation -at times Skin Manifestations -recent skin cancer removed on nose Eye Manifestations - no new vision changes Joint Manifestations - unchanged joint discomfort  GI Review of Systems Positive as above Negative for pyrosis, dysphagia,  odynophagia, nausea, vomiting, abdominal pain   Review of Systems General: Denies fevers/chills/unintentional weight loss HEENT: Denies new oral lesions Cardiovascular: Denies chest pain  Pulmonary: Denies shortness of breath Gastroenterological: See HPI Genitourinary: Denies darkened urine Hematological: Denies easy bruising Dermatological: As per HPI Psychological: Mood is stable Musculoskeletal: No new significant arthralgias   Medications Current Outpatient Medications  Medication Sig Dispense Refill   ARIPiprazole (ABILIFY) 5 MG tablet Take 5 mg by mouth daily.     Biotin w/ Vitamins C & E (HAIR/SKIN/NAILS PO) Take by mouth.     cholecalciferol (VITAMIN D3) 25 MCG (1000 UT) tablet Take 1,000 Units by mouth daily.     desvenlafaxine (PRISTIQ) 50 MG 24 hr tablet Take 50 mg by mouth daily.     EPINEPHrine (EPIPEN 2-PAK) 0.3 mg/0.3 mL IJ SOAJ injection Inject 0.3 mLs (0.3 mg total) into the muscle as needed for anaphylaxis. 1 each 1   fluticasone (FLONASE) 50 MCG/ACT nasal spray Place 2 sprays into both nostrils daily as needed for allergies or rhinitis. 16 g 5   furosemide (LASIX) 20 MG tablet Take 1 tablet (20 mg total) by mouth daily as needed for fluid. 30 tablet 0   ipratropium (ATROVENT) 0.03 % nasal spray Place 1 spray into both nostrils 4 (four) times daily. Use as directed. 30 mL 5   lamoTRIgine (LAMICTAL) 150 MG tablet Take 150 mg by mouth daily.     levocetirizine (XYZAL) 5 MG tablet Take 1 tablet (5 mg total) by mouth every evening. 30 tablet 5   Multiple Vitamin (MULTIVITAMIN) tablet Take 1 tablet by mouth daily.  ondansetron (ZOFRAN ODT) 8 MG disintegrating tablet Take 1 tablet (8 mg total) by mouth every 8 (eight) hours as needed for nausea or vomiting. 20 tablet 4   potassium chloride SA (KLOR-CON) 20 MEQ tablet Take 1 tablet (20 mEq total) by mouth daily as needed. 30 tablet 0   propranolol (INDERAL) 10 MG tablet Take 1 tablet by mouth 3 (three) times daily as  needed.     cholestyramine (QUESTRAN) 4 g packet Take 1 packet (4 g total) by mouth 2 (two) times daily. 60 each 2   mesalamine (LIALDA) 1.2 g EC tablet TAKE FOUR TABLETS BY MOUTH DAILY WITH BREAKFAST 90 tablet 2   No current facility-administered medications for this visit.    Allergies Allergies  Allergen Reactions   Doxycycline Rash and Swelling    Mouth and face   Erythromycin Shortness Of Breath   Sulfa Antibiotics Shortness Of Breath   Saxenda [Liraglutide -Weight Management] Nausea Only   Penicillins Rash    Histories Past Medical History:  Diagnosis Date   Allergy    Anxiety    Back pain    Binge eating disorder    Borderline personality disorder (HCC)    COPD (chronic obstructive pulmonary disease) (HCC)    chronic bronchitis   Depression    Edema, lower extremity    Hip pain    History of chicken pox    Joint pain    Memory loss    Pancolitis (HCC)    Poor concentration    Seasonal allergies    Skin cancer, basal cell    basal cell   Tinnitus    Vitamin D deficiency    Past Surgical History:  Procedure Laterality Date   CHOLECYSTECTOMY  2003   MOHS SURGERY  10/2017   SKIN SURGERY  05/2020   nose skin cancer   VAGINAL DELIVERY  1995   WISDOM TOOTH EXTRACTION  12/2017   Social History   Socioeconomic History   Marital status: Legally Separated    Spouse name: Not on file   Number of children: 3   Years of education: Not on file   Highest education level: Not on file  Occupational History   Occupation: Accounts Receivable    Comment: [employer]  Tobacco Use   Smoking status: Never   Smokeless tobacco: Never  Vaping Use   Vaping Use: Never used  Substance and Sexual Activity   Alcohol use: Yes    Comment: rarely   Drug use: Never   Sexual activity: Not Currently    Birth control/protection: None  Other Topics Concern   Not on file  Social History Narrative   Divorced   Works for [employer], has Designer, industrial/product   3 children    Right handed    Social Determinants of Radio broadcast assistant Strain: Not on file  Food Insecurity: Not on file  Transportation Needs: Not on file  Physical Activity: Not on file  Stress: Not on file  Social Connections: Not on file  Intimate Partner Violence: Not on file   Family History  Problem Relation Age of Onset   Hypertension Mother    Diabetes Father    Colon cancer Neg Hx    Esophageal cancer Neg Hx    Rectal cancer Neg Hx    Stomach cancer Neg Hx    Pancreatic cancer Neg Hx    Inflammatory bowel disease Neg Hx    Liver disease Neg Hx    Allergic rhinitis Neg Hx  Angioedema Neg Hx    Asthma Neg Hx    Eczema Neg Hx    Urticaria Neg Hx    Immunodeficiency Neg Hx    Atopy Neg Hx    I have reviewed her medical, social, and family history in detail and updated the electronic medical record as necessary.    PHYSICAL EXAMINATION  BP 138/82   Pulse 97   Ht 5' 7"  (1.702 m)   Wt (!) 310 lb (140.6 kg)   SpO2 98%   BMI 48.55 kg/m  GEN: NAD, appears stated age, doesn't appear chronically ill PSYCH: Cooperative, without pressured speech EYE: Conjunctivae pink, sclerae anicteric ENT: MMM, no oropharyngeal lesions CV: Nontachycardic RESP: No audible wheezing GI: NABS, soft, rounded, obese, nontender, without rebound or guarding MSK/EXT: No lower extremity edema SKIN: No jaundice NEURO:  Alert & Oriented x 3, no focal deficits   REVIEW OF DATA  I reviewed the following data at the time of this encounter:  GI Procedures and Studies  Previously reviewed  Laboratory Studies  Reviewed in epic  Imaging Studies  No relevant studies to review   ASSESSMENT  Ms. Affinito is a 55 y.o. female with a pmh significant for hx of BCC of the skin, Anxiety/MDD, COPD, Unclear Anaphylactoid reactions, status post cholecystectomy, ulcerative pancolitis (diagnosed 2020 as etiology for her chronic diarrhea).  The patient is seen today for evaluation and management of:  1.  Ulcerative pancolitis without complication ([place])   2. IBD (inflammatory bowel disease)   3. Chronic colitis   4. Chronic diarrhea   5. Fecal urgency   6. History of cholecystectomy    The patient remains hemodynamically stable.  Clinically she was doing well with cholestyramine but it seems that it has stopped working or after recent infectious etiology she had worsening symptoms.  I was hopeful that bile salt diarrhea would be playing some role for her but it seems like that may not be the case.  We will retry increased dosing of cholestyramine to see if that helps the patient but if it does not then biologic/immunomodulators seem like the next steps.  Weyman Rodney is the medication that she is potentially more interested in since it has a little bit less of a concern in regards to skin cancer development but none of the Biologics or immunomodulators are immune to this completely.  We will see how the patient does over the next few weeks after also adding bulking fiber.  Pending how the patient is doing we will consider Entyvio due to its mucosal effects and less systemic effects.  I will see her serologic and fecal biomarkers to see if we can see what is going on within the colon although we know that even in the setting of normal inflammatory markers she has had evidence of chronic colitis still present on her colon biopsies from last year.  There certainly could be an IBS/IBD overlay but with the patient having persistent chronic colitis on biopsies I favor IBD as still playing a role for the patient.  Time will tell.  We will see her back before the end of the year.  We will need to repeat liver biochemical testing at some point due to her prior elevated anti-smooth muscle antibody although overall autoimmune hepatitis seems less likely we need to keep that in mind.  All patient questions were answered to the best of my ability, and the patient agrees to the aforementioned plan of action with follow-up as  indicated.   PLAN  Continue Lialda 4.8 g daily Reinitiate cholestyramine 4 g twice daily for a week and then titrate up to 4 times daily for next few weeks Continue IBD diarrhea Fecal calprotectin to be obtained Laboratories as outlined below Consideration of Entyvio induction based on how patient is doing Likely going to hold on immunomodulator therapy due to her skin cancers that she has had removed though may increase risk for antibody formation and the patient understands this Holding on liver biopsy but may need to consider in future pending how patient's liver biochemical testing looks   Orders Placed This Encounter  Procedures   Calprotectin, Fecal   CBC   Basic Metabolic Panel (BMET)   Sedimentation rate   C-reactive protein    New Prescriptions   No medications on file   Modified Medications   Modified Medication Previous Medication   CHOLESTYRAMINE (QUESTRAN) 4 G PACKET cholestyramine (QUESTRAN) 4 g packet      Take 1 packet (4 g total) by mouth 2 (two) times daily.    Take 1 packet (4 g total) by mouth 2 (two) times daily.   MESALAMINE (LIALDA) 1.2 G EC TABLET mesalamine (LIALDA) 1.2 g EC tablet      TAKE FOUR TABLETS BY MOUTH DAILY WITH BREAKFAST    TAKE FOUR TABLETS BY MOUTH DAILY WITH BREAKFAST    Planned Follow Up: No follow-ups on file.   Total Time in Face-to-Face and in Coordination of Care for patient including independent/personal interpretation/review of prior testing, medical history, examination, medication adjustment, communicating results with the patient directly, and documentation with the EHR is 25 minutes.   Justice Britain, MD [facility] Office # [phone]

## 2020-12-29 ENCOUNTER — Other Ambulatory Visit: Payer: No Typology Code available for payment source

## 2020-12-29 DIAGNOSIS — K529 Noninfective gastroenteritis and colitis, unspecified: Secondary | ICD-10-CM

## 2021-01-01 LAB — CALPROTECTIN, FECAL: Calprotectin, Fecal: <16 ug/g (ref 0–120)

## 2021-01-07 ENCOUNTER — Other Ambulatory Visit (INDEPENDENT_AMBULATORY_CARE_PROVIDER_SITE_OTHER): Payer: No Typology Code available for payment source

## 2021-01-07 ENCOUNTER — Ambulatory Visit (INDEPENDENT_AMBULATORY_CARE_PROVIDER_SITE_OTHER): Payer: No Typology Code available for payment source | Admitting: Gastroenterology

## 2021-01-07 ENCOUNTER — Encounter: Payer: Self-pay | Admitting: Gastroenterology

## 2021-01-07 VITALS — BP 160/98 | HR 78 | Ht 67.0 in | Wt 302.0 lb

## 2021-01-07 DIAGNOSIS — K580 Irritable bowel syndrome with diarrhea: Secondary | ICD-10-CM

## 2021-01-07 DIAGNOSIS — R152 Fecal urgency: Secondary | ICD-10-CM

## 2021-01-07 DIAGNOSIS — K760 Fatty (change of) liver, not elsewhere classified: Secondary | ICD-10-CM

## 2021-01-07 DIAGNOSIS — Z9049 Acquired absence of other specified parts of digestive tract: Secondary | ICD-10-CM

## 2021-01-07 DIAGNOSIS — K529 Noninfective gastroenteritis and colitis, unspecified: Secondary | ICD-10-CM | POA: Diagnosis not present

## 2021-01-07 DIAGNOSIS — K5100 Ulcerative (chronic) pancolitis without complications: Secondary | ICD-10-CM | POA: Diagnosis not present

## 2021-01-07 DIAGNOSIS — R7989 Other specified abnormal findings of blood chemistry: Secondary | ICD-10-CM

## 2021-01-07 DIAGNOSIS — R760 Raised antibody titer: Secondary | ICD-10-CM

## 2021-01-07 LAB — C-REACTIVE PROTEIN: CRP: <1 mg/dL (ref 0.5–20.0)

## 2021-01-07 LAB — COMPREHENSIVE METABOLIC PANEL
ALT: 37 U/L — ABNORMAL HIGH (ref 0–35)
AST: 26 U/L (ref 0–37)
Albumin: 4.3 g/dL (ref 3.5–5.2)
Alkaline Phosphatase: 64 U/L (ref 39–117)
BUN: 14 mg/dL (ref 6–23)
CO2: 30 mEq/L (ref 19–32)
Calcium: 9.4 mg/dL (ref 8.4–10.5)
Chloride: 103 mEq/L (ref 96–112)
Creatinine, Ser: 0.85 mg/dL (ref 0.40–1.20)
GFR: 75.95 mL/min (ref >60.00)
Glucose, Bld: 86 mg/dL (ref 70–99)
Potassium: 3.8 mEq/L (ref 3.5–5.1)
Sodium: 140 mEq/L (ref 135–145)
Total Bilirubin: 0.5 mg/dL (ref 0.2–1.2)
Total Protein: 7.4 g/dL (ref 6.0–8.3)

## 2021-01-07 LAB — CBC
HCT: 42.1 % (ref 36.0–46.0)
Hemoglobin: 13.8 g/dL (ref 12.0–15.0)
MCHC: 32.9 g/dL (ref 30.0–36.0)
MCV: 89.6 fl (ref 78.0–100.0)
Platelets: 319 10*3/uL (ref 150.0–400.0)
RBC: 4.7 Mil/uL (ref 3.87–5.11)
RDW: 13.2 % (ref 11.5–15.5)
WBC: 5.8 10*3/uL (ref 4.0–10.5)

## 2021-01-07 LAB — SEDIMENTATION RATE: Sed Rate: 7 mm/hr (ref 0–30)

## 2021-01-07 MED ORDER — NA SULFATE-K SULFATE-MG SULF 17.5-3.13-1.6 GM/177ML PO SOLN: 1.0000 | Freq: Once | ORAL | 0 refills | Status: AC

## 2021-01-07 MED ORDER — RIFAXIMIN 550 MG PO TABS: 550.0000 mg | ORAL_TABLET | Freq: Two times a day (BID) | ORAL | 0 refills | Status: DC

## 2021-01-07 NOTE — Patient Instructions (Addendum)
Your provider has requested that you go to the basement level for lab work before leaving today. Press "B" on the elevator. The lab is located at the first door on the left as you exit the elevator.  We have sent the following medications to your pharmacy for you to pick up at your convenience: [place]  516m - take one tablet by mouth twice a day for 14 days   You have been scheduled for a colonoscopy. Please follow written instructions given to you at your visit today.  Please pick up your prep supplies at the pharmacy within the next 1-3 days. If you use inhalers (even only as needed), please bring them with you on the day of your procedure.  Please keep follow up appointment on 02/25/2021 at 3:30.  If you are age [age]or older, your body mass index should be between 23-30. Your Body mass index is 47.3 kg/m. If this is out of the aforementioned range listed, please consider follow up with your Primary Care Provider.  If you are age 4027or younger, your body mass index should be between 19-25. Your Body mass index is 47.3 kg/m. If this is out of the aformentioned range listed, please consider follow up with your Primary Care Provider.   ________________________________________________________  The [facility] providers would like to encourage you to use [facility]to communicate with providers for non-urgent requests or questions.  Due to long hold times on the telephone, sending your provider a message by [facility]may be a faster and more efficient way to get a response.  Please allow 48 business hours for a response.  Please remember that this is for non-urgent requests.  _______________________________________________________  It was a pleasure to see you today!  Thank you for trusting me with your gastrointestinal care!

## 2021-01-07 NOTE — Progress Notes (Signed)
[place] VISIT   Primary Care Provider [place], [place], [place] [address] [place] [address] [phone]  Patient Profile: Leslie Douglas is a 55 y.o. female with a pmh significant for hx of BCC of the skin, Anxiety/MDD, COPD, Unclear Anaphylactoid reactions, status postcholecystectomy, ulcerative pancolitis (diagnosed 2020 as etiology for her chronic diarrhea), concern for IBS overlap.  The patient presents to the [facility] for an evaluation and management of problem(s) noted below:  Problem List 1. Irritable bowel syndrome with diarrhea   2. IBD (inflammatory bowel disease)   3. 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 Chronic diarrhea   5. Fecal urgency   6. Fatty liver   7. Abnormal LFTs   8. History of cholecystectomy     History of Present Illness: Please see prior notes for full details of HPI.  Interval History Today, the patient returns for follow-up.  When I saw her back in July we decided to initiate her on cholestyramine to see if bile salt diarrhea was playing any role in her symptoms.  She initially had some benefit by using this and was using it up to twice daily but subsequently describes a "GI illness" where she has now not had any improvement for quite a while and stopped taking the cholestyramine for at least the last month.  She has continued to think about potential biologic therapy for her since she has had chronic colitis on sampling last year.  She still has issues with stress and anxiety.  She is hopeful to feel better.  She continues taking her Lialda daily.    Her current IBD diary is as below: BMs per day -between 3-6 Nocturnal BMs -none Blood -none Mucous -infrequently Tenesmus -none Urgency -not often Incomplete evaluation -at times Skin Manifestations -recent skin cancer removed on nose Eye Manifestations - no new vision changes Joint Manifestations - unchanged  joint discomfort  GI Review of Systems Positive as above Negative for pyrosis, dysphagia, odynophagia, nausea, vomiting, abdominal pain   Review of Systems General: Denies fevers/chills/unintentional weight loss HEENT: Denies new oral lesions Cardiovascular: Denies chest pain  Pulmonary: Denies shortness of breath Gastroenterological: See HPI Genitourinary: Denies darkened urine Hematological: Denies easy bruising Dermatological: As per HPI Psychological: Mood is stable Musculoskeletal: No new significant arthralgias   Medications Current Outpatient Medications  Medication Sig Dispense Refill   ARIPiprazole (ABILIFY) 5 MG tablet Take 5 mg by mouth daily.     Biotin w/ Vitamins C & E (HAIR/SKIN/NAILS PO) Take by mouth.     cholecalciferol (VITAMIN D3) 25 MCG (1000 UT) tablet Take 1,000 Units by mouth daily.     desvenlafaxine (PRISTIQ) 50 MG 24 hr tablet Take 50 mg by mouth daily.     EPINEPHrine (EPIPEN 2-PAK) 0.3 mg/0.3 mL IJ SOAJ injection Inject 0.3 mLs (0.3 mg total) into the muscle as needed for anaphylaxis. 1 each 1   fluticasone (FLONASE) 50 MCG/ACT nasal spray Place 2 sprays into both nostrils daily as needed for allergies or rhinitis. 16 g 5   furosemide (LASIX) 20 MG tablet Take 1 tablet (20 mg total) by mouth daily as needed for fluid. 30 tablet 0   ipratropium (ATROVENT) 0.03 % nasal spray Place 1 spray into both nostrils 4 (four) times daily. Use as directed. (Patient taking differently: Place 1 spray into both nostrils 4 (four) times daily as needed. Use as directed.) 30 mL 5   levocetirizine (XYZAL) 5 MG tablet Take 1 tablet (5  mg total) by mouth every evening. (Patient taking differently: Take 5 mg by mouth at bedtime as needed.) 30 tablet 5   mesalamine (LIALDA) 1.2 g EC tablet TAKE FOUR TABLETS BY MOUTH DAILY WITH BREAKFAST 90 tablet 2   Multiple Vitamin (MULTIVITAMIN) tablet Take 1 tablet by mouth daily.     ondansetron (ZOFRAN ODT) 8 MG disintegrating tablet Take  1 tablet (8 mg total) by mouth every 8 (eight) hours as needed for nausea or vomiting. 20 tablet 4   potassium chloride SA (KLOR-CON) 20 MEQ tablet Take 1 tablet (20 mEq total) by mouth daily as needed. 30 tablet 0   propranolol (INDERAL) 10 MG tablet Take 1 tablet by mouth 3 (three) times daily as needed.     rifaximin (XIFAXAN) 550 MG TABS tablet Take 1 tablet (550 mg total) by mouth 2 (two) times daily. 28 tablet 0   No current facility-administered medications for this visit.    Allergies Allergies  Allergen Reactions   Doxycycline Rash and Swelling    Mouth and face   Erythromycin Shortness Of Breath   Sulfa Antibiotics Shortness Of Breath   Saxenda [Liraglutide -Weight Management] Nausea Only   Penicillins Rash    Histories Past Medical History:  Diagnosis Date   Allergy    Anxiety    Back pain    Binge eating disorder    Borderline personality disorder (HCC)    COPD (chronic obstructive pulmonary disease) (HCC)    chronic bronchitis   Depression    Edema, lower extremity    Hip pain    History of chicken pox    Joint pain    Memory loss    Pancolitis (HCC)    Poor concentration    Seasonal allergies    Skin cancer, basal cell    basal cell   Tinnitus    Vitamin D deficiency    Past Surgical History:  Procedure Laterality Date   CHOLECYSTECTOMY  2003   MOHS SURGERY  10/2017   SKIN SURGERY  05/2020   nose skin cancer   VAGINAL DELIVERY  1995   WISDOM TOOTH EXTRACTION  12/2017   Social History   Socioeconomic History   Marital status: Legally Separated    Spouse name: Not on file   Number of children: 3   Years of education: Not on file   Highest education level: Not on file  Occupational History   Occupation: Accounts Receivable    Comment: [employer]  Tobacco Use   Smoking status: Never   Smokeless tobacco: Never  Vaping Use   Vaping Use: Never used  Substance and Sexual Activity   Alcohol use: Yes    Comment: rarely   Drug use: Never   Sexual  activity: Not Currently    Birth control/protection: None  Other Topics Concern   Not on file  Social History Narrative   Divorced   Works for [employer], has Designer, industrial/product   3 children   Right handed    Social Determinants of Health   Financial Resource Strain: Not on file  Food Insecurity: Not on file  Transportation Needs: Not on file  Physical Activity: Not on file  Stress: Not on file  Social Connections: Not on file  Intimate Partner Violence: Not on file   Family History  Problem Relation Age of Onset   Hypertension Mother    Diabetes Father    Colon cancer Neg Hx    Esophageal cancer Neg Hx    Rectal cancer  Neg Hx    Stomach cancer Neg Hx    Pancreatic cancer Neg Hx    Inflammatory bowel disease Neg Hx    Liver disease Neg Hx    Allergic rhinitis Neg Hx    Angioedema Neg Hx    Asthma Neg Hx    Eczema Neg Hx    Urticaria Neg Hx    Immunodeficiency Neg Hx    Atopy Neg Hx    I have reviewed her medical, social, and family history in detail and updated the electronic medical record as necessary.    PHYSICAL EXAMINATION  BP (!) 160/98    Pulse 78    Ht _0  (1.702 m)    Wt (!) 302 lb (137 kg)    SpO2 97%    BMI 47.30 kg/m  GEN: NAD, appears stated age, doesn't appear chronically ill PSYCH: Cooperative, without pressured speech EYE: Conjunctivae pink, sclerae anicteric ENT: MMM, no oropharyngeal lesions CV: Nontachycardic RESP: No audible wheezing GI: NABS, soft, rounded, obese, nontender, without rebound or guarding MSK/EXT: No lower extremity edema SKIN: No jaundice NEURO:  Alert & Oriented x 3, no focal deficits   REVIEW OF DATA  I reviewed the following data at the time of this encounter:  GI Procedures and Studies  Previously reviewed  Laboratory Studies  Reviewed in epic  Imaging Studies  No relevant studies to review   ASSESSMENT  Ms. Winger is a 55 y.o. female with a pmh significant for hx of BCC of the skin, Anxiety/MDD,  COPD, Unclear Anaphylactoid reactions, status postcholecystectomy, ulcerative pancolitis (diagnosed 2020 as etiology for her chronic diarrhea), concern for IBS overlap.  The patient is seen today for evaluation and management of:  1. Irritable bowel syndrome with diarrhea   2. IBD (inflammatory bowel disease)   3. 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 Chronic diarrhea   5. Fecal urgency   6. Fatty liver   7. Abnormal LFTs   8. History of cholecystectomy    The patient remains hemodynamically stable.  Clinically she was doing well with cholestyramine but it seems that it has stopped working or after recent infectious etiology she had worsening symptoms.  I was hopeful that bile salt diarrhea would be playing some role for her but it seems like that may not be the case.  We will retry increased dosing of cholestyramine to see if that helps the patient but if it does not then biologic/immunomodulators seem like the next steps.  Weyman Rodney is the medication that she is potentially more interested in since it has a little bit less of a concern in regards to skin cancer development but none of the Biologics or immunomodulators are immune to this completely.  We will see how the patient does over the next few weeks after also adding bulking fiber.  Pending how the patient is doing we will consider Entyvio due to its mucosal effects and less systemic effects.  I will see her serologic and fecal biomarkers to see if we can see what is going on within the colon although we know that even in the setting of normal inflammatory markers she has had evidence of chronic colitis still present on her colon biopsies from last year.  There certainly could be an IBS/IBD overlay but with the patient having persistent chronic colitis on biopsies I favor IBD as still playing a role for the patient.  Time will tell.  We will see her back before the end of the year.  We will need to repeat liver biochemical  testing at some point due to her prior elevated anti-smooth muscle antibody although overall autoimmune hepatitis seems less likely we need to keep that in mind.  All patient questions were answered to the best of my ability, and the patient agrees to the aforementioned plan of action with follow-up as indicated.   PLAN  Continue Lialda 4.8 g daily Reinitiate cholestyramine 4 g twice daily for a week and then titrate up to 4 times daily for next few weeks Continue IBD diarrhea Fecal calprotectin to be obtained Laboratories as outlined below Consideration of Entyvio induction based on how patient is doing Likely going to hold on immunomodulator therapy due to her skin cancers that she has had removed though may increase risk for antibody formation and the patient understands this Holding on liver biopsy but may need to consider in future pending how patient's liver biochemical testing looks   Orders Placed This Encounter  Procedures   CBC   Comp Met (CMET)   Sed Rate (ESR)   C-reactive protein   Ambulatory referral to Gastroenterology    New Prescriptions   RIFAXIMIN (XIFAXAN) 550 MG TABS TABLET    Take 1 tablet (550 mg total) by mouth 2 (two) times daily.   Modified Medications   No medications on file    Planned Follow Up: No follow-ups on file.   Total Time in Face-to-Face and in Coordination of Care for patient including independent/personal interpretation/review of prior testing, medical history, examination, medication adjustment, communicating results with the patient directly, and documentation with the EHR is 25 minutes.   Justice Britain, MD [facility] Office # [phone]

## 2021-01-08 ENCOUNTER — Encounter: Payer: Self-pay | Admitting: Gastroenterology

## 2021-01-08 NOTE — Progress Notes (Signed)
[place] VISIT   Primary Care Provider [place], [place], [place] [address] [place] [address] [phone]  Patient Profile: Leslie Douglas is a 55 y.o. female with a pmh significant for hx of BCC of the skin, Anxiety/MDD, COPD, Unclear Anaphylactoid reactions, status postcholecystectomy, ulcerative pancolitis (diagnosed 2020 as etiology for her chronic diarrhea), possible IBS-D overlap.  The patient presents to the [facility] for an evaluation and management of problem(s) noted below:  Problem List 1. Irritable bowel syndrome with diarrhea   2. IBD (inflammatory bowel disease)   3. 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 Chronic diarrhea   5. Fecal urgency   6. Fatty liver   7. Abnormal LFTs   8. History of cholecystectomy   9. Positive Anti-Smooth Muscle antibody      History of Present Illness: Please see prior notes for full details of HPI.  Interval History The patient returns for scheduled follow-up.  She trialed fiber and cholestyramine singly and then together and had worsened symptoms of diarrhea.  As a result she has stopped both of these.  She still has symptoms as noted below.  She continues to ponder about biologic therapy such as Entyvio.  She still is dealing with issues of stress and anxiety as result of her work/life.  She is taking her Lialda daily.    Her current IBD diary is as below: BMs per day -between 2-6 Nocturnal BMs -none Blood -infrequently Mucous -infrequently Tenesmus -none Urgency -not often Incomplete evaluation -at times Skin Manifestations -no new skin cancers removed Eye Manifestations - no new vision changes Joint Manifestations -no new joint pains  GI Review of Systems Positive as above including bloating infrequently Negative for dysphagia, odynophagia, nausea, vomiting, pain, melena   Review of Systems General: Denies fevers/chills/unintentional  weight loss HEENT: Denies new oral lesions Cardiovascular: Denies chest pain  Pulmonary: Denies shortness of breath Gastroenterological: See HPI Genitourinary: Denies darkened urine Hematological: Denies easy bruising Dermatological: As per HPI Psychological: Mood is stable Musculoskeletal: No new significant arthralgias   Medications Current Outpatient Medications  Medication Sig Dispense Refill   ARIPiprazole (ABILIFY) 5 MG tablet Take 5 mg by mouth daily.     Biotin w/ Vitamins C & E (HAIR/SKIN/NAILS PO) Take by mouth.     cholecalciferol (VITAMIN D3) 25 MCG (1000 UT) tablet Take 1,000 Units by mouth daily.     desvenlafaxine (PRISTIQ) 50 MG 24 hr tablet Take 50 mg by mouth daily.     EPINEPHrine (EPIPEN 2-PAK) 0.3 mg/0.3 mL IJ SOAJ injection Inject 0.3 mLs (0.3 mg total) into the muscle as needed for anaphylaxis. 1 each 1   fluticasone (FLONASE) 50 MCG/ACT nasal spray Place 2 sprays into both nostrils daily as needed for allergies or rhinitis. 16 g 5   furosemide (LASIX) 20 MG tablet Take 1 tablet (20 mg total) by mouth daily as needed for fluid. 30 tablet 0   ipratropium (ATROVENT) 0.03 % nasal spray Place 1 spray into both nostrils 4 (four) times daily. Use as directed. (Patient taking differently: Place 1 spray into both nostrils 4 (four) times daily as needed. Use as directed.) 30 mL 5   levocetirizine (XYZAL) 5 MG tablet Take 1 tablet (5 mg total) by mouth every evening. (Patient taking differently: Take 5 mg by mouth at bedtime as needed.) 30 tablet 5   mesalamine (LIALDA) 1.2 g EC tablet TAKE FOUR TABLETS BY MOUTH DAILY WITH BREAKFAST 90 tablet 2   Multiple  Vitamin (MULTIVITAMIN) tablet Take 1 tablet by mouth daily.     ondansetron (ZOFRAN ODT) 8 MG disintegrating tablet Take 1 tablet (8 mg total) by mouth every 8 (eight) hours as needed for nausea or vomiting. 20 tablet 4   potassium chloride SA (KLOR-CON) 20 MEQ tablet Take 1 tablet (20 mEq total) by mouth daily as needed. 30  tablet 0   propranolol (INDERAL) 10 MG tablet Take 1 tablet by mouth 3 (three) times daily as needed.     rifaximin (XIFAXAN) 550 MG TABS tablet Take 1 tablet (550 mg total) by mouth 2 (two) times daily. 28 tablet 0   No current facility-administered medications for this visit.    Allergies Allergies  Allergen Reactions   Doxycycline Rash and Swelling    Mouth and face   Erythromycin Shortness Of Breath   Sulfa Antibiotics Shortness Of Breath   Saxenda [Liraglutide -Weight Management] Nausea Only   Penicillins Rash    Histories Past Medical History:  Diagnosis Date   Allergy    Anxiety    Back pain    Binge eating disorder    Borderline personality disorder (HCC)    COPD (chronic obstructive pulmonary disease) (HCC)    chronic bronchitis   Depression    Edema, lower extremity    Hip pain    History of chicken pox    Joint pain    Memory loss    Pancolitis (HCC)    Poor concentration    Seasonal allergies    Skin cancer, basal cell    basal cell   Tinnitus    Vitamin D deficiency    Past Surgical History:  Procedure Laterality Date   CHOLECYSTECTOMY  2003   MOHS SURGERY  10/2017   SKIN SURGERY  05/2020   nose skin cancer   VAGINAL DELIVERY  1995   WISDOM TOOTH EXTRACTION  12/2017   Social History   Socioeconomic History   Marital status: Legally Separated    Spouse name: Not on file   Number of children: 3   Years of education: Not on file   Highest education level: Not on file  Occupational History   Occupation: Accounts Receivable    Comment: [employer]  Tobacco Use   Smoking status: Never   Smokeless tobacco: Never  Vaping Use   Vaping Use: Never used  Substance and Sexual Activity   Alcohol use: Yes    Comment: rarely   Drug use: Never   Sexual activity: Not Currently    Birth control/protection: None  Other Topics Concern   Not on file  Social History Narrative   Divorced   Works for [employer], has Designer, industrial/product   3 children    Right handed    Social Determinants of Radio broadcast assistant Strain: Not on file  Food Insecurity: Not on file  Transportation Needs: Not on file  Physical Activity: Not on file  Stress: Not on file  Social Connections: Not on file  Intimate Partner Violence: Not on file   Family History  Problem Relation Age of Onset   Hypertension Mother    Diabetes Father    Colon cancer Neg Hx    Esophageal cancer Neg Hx    Rectal cancer Neg Hx    Stomach cancer Neg Hx    Pancreatic cancer Neg Hx    Inflammatory bowel disease Neg Hx    Liver disease Neg Hx    Allergic rhinitis Neg Hx    Angioedema Neg  Hx    Asthma Neg Hx    Eczema Neg Hx    Urticaria Neg Hx    Immunodeficiency Neg Hx    Atopy Neg Hx    I have reviewed her medical, social, and family history in detail and updated the electronic medical record as necessary.    PHYSICAL EXAMINATION  BP (!) 160/98    Pulse 78    Ht _0  (1.702 m)    Wt (!) 302 lb (137 kg)    SpO2 97%    BMI 47.30 kg/m  GEN: NAD, appears stated age, doesn't appear chronically ill PSYCH: Cooperative, without pressured speech EYE: Conjunctivae pink, sclerae anicteric ENT: MMM CV: Nontachycardic RESP: No audible wheezing GI: NABS, rounded, obese, nontender, without rebound MSK/EXT: No lower extremity edema SKIN: No jaundice NEURO:  Alert & Oriented x 3, no focal deficits   REVIEW OF DATA  I reviewed the following data at the time of this encounter:  GI Procedures and Studies  Previously reviewed  Laboratory Studies  Reviewed in epic  Imaging Studies  No relevant studies to review   ASSESSMENT  Ms. Un is a 55 y.o. female with a pmh significant for hx of BCC of the skin, Anxiety/MDD, COPD, Unclear Anaphylactoid reactions, status postcholecystectomy, ulcerative pancolitis (diagnosed 2020 as etiology for her chronic diarrhea), possible IBS-D overlap.  The patient is seen today for evaluation and management of:  1. Irritable bowel  syndrome with diarrhea   2. IBD (inflammatory bowel disease)   3. 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 Chronic diarrhea   5. Fecal urgency   6. Fatty liver   7. Abnormal LFTs   8. History of cholecystectomy   9. Positive Anti-Smooth Muscle antibody     The patient is hemodynamically stable.  I had hoped that the thought of bile salt diarrhea and her being back on cholestyramine on a regular basis would be helpful.  It worsened her symptoms.  Bulking fiber did not help as well.  Interestingly, her fecal calprotectin has returned as normal.  At the onset of her symptoms and diagnosis, before we started her on Lialda, she did have an elevated fecal calprotectin but no other inflammatory markers that have been elevated to use as biomarkers.  Even while on Lialda for greater than 6 months, she still had microscopic evidence of inflammation though endoscopically things were better.  I do still think that this patient likely will end up needing biologic therapy such as Entyvio.  However, with the possibility of an IBS-D overlap with the patient's significant stress and anxiety that she deals with, I wonder if a trial of rifaximin therapy may be in her best intentions so that we get try to do everything we can before we put her on biologic therapy.  I we will plan a colonoscopy in 2023, pending that she does not do well with a round of IBS-D rifaximin therapy.  If we can show that she still has evidence of colitis, and she still having symptoms, then even though her biomarkers are normal, this would be a reason to move forward with Entyvio dose.  I cannot use immunomodulators due to her history of skin cancers unfortunately.  We will see how the patient's liver tests look as well although this is most likely fatty liver disease related, she has had elevations in anti-smooth muscle antibody in the past).  We are holding on liver biopsy for now but could consider in the future if there  is a  significant rise in LFTs.  I will see the patient back in the next few months after she is done a trial of rifaximin.  And we will then decide on the moving forward of colonoscopy or not.  The risks and benefits of endoscopic evaluation were discussed with the patient; these include but are not limited to the risk of perforation, infection, bleeding, missed lesions, lack of diagnosis, severe illness requiring hospitalization, as well as anesthesia and sedation related illnesses.  The patient and/or family is agreeable to proceed.  All patient questions were answered to the best of my ability, and the patient agrees to the aforementioned plan of action with follow-up as indicated.   PLAN  Continue Lialda 4.8 g daily Stop cholestyramine Stop bulking fiber Laboratories as outlined below Rifaximin therapy for IBS-D Colonoscopy in 2023 if symptoms are persisting to see if chronic colitis persists before potential initiation of biologic Entyvio Will follow-up LFTs to consider the role of liver biopsy    Orders Placed This Encounter  Procedures   CBC   Comp Met (CMET)   Sed Rate (ESR)   C-reactive protein   Ambulatory referral to Gastroenterology    New Prescriptions   RIFAXIMIN (XIFAXAN) 550 MG TABS TABLET    Take 1 tablet (550 mg total) by mouth 2 (two) times daily.   Modified Medications   No medications on file    Planned Follow Up: No follow-ups on file.   Total Time in Face-to-Face and in Coordination of Care for patient including independent/personal interpretation/review of prior testing, medical history, examination, medication adjustment, communicating results with the patient directly, and documentation with the EHR is 25 minutes.   Justice Britain, MD [facility] Office # [phone]

## 2021-01-21 ENCOUNTER — Other Ambulatory Visit: Payer: Self-pay | Admitting: Gastroenterology

## 2021-02-19 ENCOUNTER — Telehealth (INDEPENDENT_AMBULATORY_CARE_PROVIDER_SITE_OTHER): Payer: No Typology Code available for payment source | Admitting: Family Medicine

## 2021-02-19 ENCOUNTER — Other Ambulatory Visit: Payer: Self-pay

## 2021-02-19 ENCOUNTER — Encounter: Payer: Self-pay | Admitting: Family Medicine

## 2021-02-19 VITALS — Ht 67.0 in | Wt 302.0 lb

## 2021-02-19 DIAGNOSIS — U071 COVID-19: Secondary | ICD-10-CM | POA: Diagnosis not present

## 2021-02-19 DIAGNOSIS — F418 Other specified anxiety disorders: Secondary | ICD-10-CM | POA: Diagnosis not present

## 2021-02-19 MED ORDER — MOLNUPIRAVIR 200 MG PO CAPS: 4.0000 | ORAL_CAPSULE | Freq: Two times a day (BID) | ORAL | 0 refills | Status: AC

## 2021-02-19 MED ORDER — BENZONATATE 200 MG PO CAPS: 200.0000 mg | ORAL_CAPSULE | Freq: Two times a day (BID) | ORAL | 0 refills | Status: DC | PRN

## 2021-02-19 NOTE — Progress Notes (Signed)
° °  Leslie Douglas is a 56 y.o. female who presents today for a virtual office visit.  Assessment/Plan:  New/Acute Problems: COVID No red flag signs or symptoms.  We will start molnupiravir and Tessalon.  She can continue over-the-counter meds.  Encouraged hydration.  Discussed reasons to return to care.  Chronic Problems Addressed Today: Anxiety with depression On Abilify.  We will avoid Paxlovid.  Ulcerative colitis Follows with GI.  On Lialda.     Subjective:  HPI:  Patient here with covid.  Symptoms started 3-4 days ago. Took a home test 2 days ago which was positive. Symptoms include congestion, cough, sore throat, diarrhea, nausea, malaise, and fevers. Tried OTC tylenol with some improvement in symptoms. Woke up a lot last night coughing. Symptoms seem to be worsening.        Objective/Observations  Physical Exam: Gen: NAD, resting comfortably Pulm: Normal work of breathing Neuro: Grossly normal, moves all extremities Psych: Normal affect and thought content  Virtual Visit via Video   I connected with Evelene Croon on 02/19/21 at  4:00 PM EST by a video enabled telemedicine application and verified that I am speaking with the correct person using two identifiers. The limitations of evaluation and management by telemedicine and the availability of in person appointments were discussed. The patient expressed understanding and agreed to proceed.   Patient location: Home Provider location: [place] participating in the virtual visit: Myself and Patient     Algis Greenhouse. Jerline Pain, MD 02/19/2021 3:37 PM

## 2021-02-25 ENCOUNTER — Ambulatory Visit: Payer: No Typology Code available for payment source | Admitting: Gastroenterology

## 2021-03-10 ENCOUNTER — Encounter: Payer: No Typology Code available for payment source | Admitting: Gastroenterology

## 2021-03-17 ENCOUNTER — Ambulatory Visit: Payer: No Typology Code available for payment source | Admitting: Gastroenterology

## 2021-03-25 ENCOUNTER — Encounter: Payer: Self-pay | Admitting: Gastroenterology

## 2021-03-25 ENCOUNTER — Ambulatory Visit: Payer: No Typology Code available for payment source | Admitting: Gastroenterology

## 2021-03-25 VITALS — BP 124/70 | HR 79 | Ht 67.0 in | Wt 294.0 lb

## 2021-03-25 DIAGNOSIS — K529 Noninfective gastroenteritis and colitis, unspecified: Secondary | ICD-10-CM

## 2021-03-25 DIAGNOSIS — K589 Irritable bowel syndrome without diarrhea: Secondary | ICD-10-CM

## 2021-03-25 DIAGNOSIS — K580 Irritable bowel syndrome with diarrhea: Secondary | ICD-10-CM

## 2021-03-25 NOTE — Progress Notes (Signed)
? ?GASTROENTEROLOGY OUTPATIENT CLINIC VISIT  ? ?Primary Care Provider ?Inda Co[place], [place] ?[place] [address] ?[phone] ? ?Patient Profile: ?Leslie Douglas is a 56 y.o. 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  The patient presents to the [facility] for an evaluation and management of problem(s) noted below: ? ?Problem List ?1. Chronic diarrhea   ?2. Chronic colitis   ?3. Irritable bowel syndrome with diarrhea   ?4. IBD (inflammatory bowel disease)   ? ? ?History of Present Illness: ?Please see prior notes for full details of HPI. ? ?Interval History ?The patient returns for a scheduled follow-up.  Xifaxan therapy was not effective in changing her bowel habits.  She has been undergoing further therapies in regards to her underlying mental health disorders and unfortunately seems to have developed an allergy to her newest medication/patch.  She is working with her psychiatrist in regards to further optimizing her anxiety issues.  She is hopeful that if we end up moving to a biologic therapy such as Entyvio she may have some further improvement from a GI standpoint.  She continues to take her Lialda.   ? ?Her IBD diary is as below: ?BMs per day -between 2-4 ?Nocturnal BMs -none ?Blood -denies ?Mucous -infrequently ?Tenesmus -none ?Urgency -not often ?Incomplete evaluation -at times ?Skin Manifestations -no new skin cancers removed but dealing with allergic reaction to patches ?Eye Manifestations - no new vision changes ?Joint Manifestations -no new joint pains ? ?GI Review of Systems ?Positive as above ?Negative for dysphagia, odynophagia, nausea, vomiting, pain, melena  ? ?Review of Systems ?General: Denies  fevers/chills/unintentional weight loss ?HEENT: Denies new oral lesions ?Cardiovascular: Denies chest pain  ?Pulmonary: Denies shortness of breath ?Gastroenterological: See HPI ?Genitourinary: Denies darkened urine ?Hematological: Denies easy bruising ?Dermatological: As per HPI ?Psychological: Mood is stable though hopes that she can get on a different medication to help her since she can no longer tolerate the patches ?Musculoskeletal: No new significant arthralgias ? ? ?Medications ?Current Outpatient Medications  ?Medication Sig Dispense Refill  ? ARIPiprazole (ABILIFY) 5 MG tablet Take 5 mg by mouth daily.    ? benzonatate (TESSALON) 200 MG capsule Take 1 capsule (200 mg total) by mouth 2 (two) times daily as needed for cough. 20 capsule 0  ? Biotin w/ Vitamins C & E (HAIR/SKIN/NAILS PO) Take by mouth.    ? cholecalciferol (VITAMIN D3) 25 MCG (1000 UT) tablet Take 1,000 Units by mouth daily.    ? EMSAM 6 MG/24HR Place 6 mg onto the skin daily.    ? EPINEPHrine (EPIPEN 2-PAK) 0.3 mg/0.3 mL IJ SOAJ injection Inject 0.3 mLs (0.3 mg total) into the muscle as needed for anaphylaxis. 1 each 1  ? fluticasone (FLONASE) 50 MCG/ACT nasal spray Place 2 sprays into both nostrils daily as needed for allergies or rhinitis. 16 g 5  ? furosemide (LASIX) 20 MG tablet Take 1 tablet (20 mg total) by mouth daily as needed for fluid. 30 tablet 0  ? ipratropium (ATROVENT) 0.03 % nasal spray Place 1 spray into both nostrils 4 (four) times daily. Use as directed. (Patient taking differently: Place 1 spray into both nostrils 4 (four) times daily as needed. Use as directed.) 30 mL 5  ? levocetirizine (XYZAL) 5 MG tablet Take 1 tablet (5 mg total) by  mouth every evening. (Patient taking differently: Take 5 mg by mouth at bedtime as needed.) 30 tablet 5  ? mesalamine (LIALDA) 1.2 g EC tablet TAKE FOUR TABLETS BY MOUTH DAILY WITH BREAKFAST 120 tablet 1  ? Multiple Vitamin (MULTIVITAMIN) tablet Take 1 tablet by mouth daily.    ? ondansetron  (ZOFRAN ODT) 8 MG disintegrating tablet Take 1 tablet (8 mg total) by mouth every 8 (eight) hours as needed for nausea or vomiting. 20 tablet 4  ? potassium chloride SA (KLOR-CON) 20 MEQ tablet Take 1 tablet (20 mEq total) by mouth daily as needed. 30 tablet 0  ? propranolol (INDERAL) 10 MG tablet Take 1 tablet by mouth 3 (three) times daily as needed.    ? ?No current facility-administered medications for this visit.  ? ? ?Allergies ?Allergies  ?Allergen Reactions  ? Doxycycline Rash and Swelling  ?  Mouth and face  ? Erythromycin Shortness Of Breath  ? Sulfa Antibiotics Shortness Of Breath  ? Saxenda [Liraglutide -Weight Management] Nausea Only  ? Penicillins Rash  ? ? ?Histories ?Past Medical History:  ?Diagnosis Date  ? Allergy   ? Anxiety   ? Back pain   ? Binge eating disorder   ? Borderline personality disorder ([place])   ? COPD (chronic obstructive pulmonary disease) ([place])   ? chronic bronchitis  ? Depression   ? Edema, lower extremity   ? Hip pain   ? History of chicken pox   ? Joint pain   ? Memory loss   ? Pancolitis ([place])   ? Poor concentration   ? Seasonal allergies   ? Skin cancer, basal cell   ? basal cell  ? Tinnitus   ? Vitamin D deficiency   ? ?Past Surgical History:  ?Procedure Laterality Date  ? CHOLECYSTECTOMY  2003  ? MOHS SURGERY  10/2017  ? SKIN SURGERY  05/2020  ? nose skin cancer  ? VAGINAL DELIVERY  1995  ? WISDOM TOOTH EXTRACTION  12/2017  ? ?Social History  ? ?Socioeconomic History  ? Marital status: Legally Separated  ?  Spouse name: Not on file  ? Number of children: 3  ? Years of education: Not on file  ? Highest education level: Not on file  ?Occupational History  ? Occupation: Aeronautical engineer  ?  Comment: [employer]  ?Tobacco Use  ? Smoking status: Never  ? Smokeless tobacco: Never  ?Vaping Use  ? Vaping Use: Never used  ?Substance and Sexual Activity  ? Alcohol use: Yes  ?  Comment: rarely  ? Drug use: Never  ? Sexual activity: Not Currently  ?  Birth control/protection: None  ?Other  Topics Concern  ? Not on file  ?Social History Narrative  ? Divorced  ? Works for [employer], has Associate Degree  ? 3 children  ? Right handed   ? ?Social Determinants of Health  ? ?Financial Resource Strain: Not on file  ?Food Insecurity: Not on file  ?Transportation Needs: Not on file  ?Physical Activity: Not on file  ?Stress: Not on file  ?Social Connections: Not on file  ?Intimate Partner Violence: Not on file  ? ?Family History  ?Problem Relation Age of Onset  ? Hypertension Mother   ? Diabetes Father   ? Colon cancer Neg Hx   ? Esophageal cancer Neg Hx   ? Rectal cancer Neg Hx   ? Stomach cancer Neg Hx   ? Pancreatic cancer Neg Hx   ? Inflammatory bowel disease Neg Hx   ?  Liver disease Neg Hx   ? Allergic rhinitis Neg Hx   ? Angioedema Neg Hx   ? Asthma Neg Hx   ? Eczema Neg Hx   ? Urticaria Neg Hx   ? Immunodeficiency Neg Hx   ? Atopy Neg Hx   ? ?I have reviewed her medical, social, and family history in detail and updated the electronic medical record as necessary.  ? ? ?PHYSICAL EXAMINATION  ?BP 124/70 Comment: large cuff  Pulse 79   Ht 5' 7"  (1.702 m)   Wt 294 lb (133.4 kg)   BMI 46.05 kg/m?  ?GEN: NAD, appears stated age, doesn't appear chronically ill ?PSYCH: Cooperative, without pressured speech ?EYE: Conjunctivae pink, sclerae anicteric ?ENT: MMM ?CV: Nontachycardic ?RESP: No audible wheezing ?GI: NABS, rounded, obese, nontender, without rebound ?MSK/EXT: No lower extremity edema ?SKIN: No jaundice, skin rashes noted on bilateral upper extremities with some bullous formation ?NEURO:  Alert & Oriented x 3, no focal deficits ? ? ?REVIEW OF DATA  ?I reviewed the following data at the time of this encounter: ? ?GI Procedures and Studies  ?Previously reviewed ? ?Laboratory Studies  ?Reviewed in epic ? ?Imaging Studies  ?No relevant studies to review ? ? ?ASSESSMENT  ?Ms. Towell is a 56 y.o. 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  The patien

## 2021-03-25 NOTE — Patient Instructions (Signed)
You have been scheduled for a colonoscopy. Please follow written instructions given to you at your visit today.  ?Please pick up your prep supplies at the pharmacy within the next 1-3 days. ?If you use inhalers (even only as needed), please bring them with you on the day of your procedure. ? ? ?If you are age 58 or younger, your body mass index should be between 19-25. Your Body mass index is 46.05 kg/m?Marland Kitchen If this is out of the aformentioned range listed, please consider follow up with your Primary Care Provider.  ? ?________________________________________________________ ? ?The [facility] providers would like to encourage you to use [facility] to communicate with providers for non-urgent requests or questions.  Due to long hold times on the telephone, sending your provider a message by [facility] may be a faster and more efficient way to get a response.  Please allow 48 business hours for a response.  Please remember that this is for non-urgent requests.  ?_______________________________________________________ ? ?Thank you for choosing me and [facility]. ? ?Dr. Rush Landmark ?  ?

## 2021-03-26 ENCOUNTER — Encounter: Payer: Self-pay | Admitting: Gastroenterology

## 2021-03-28 DIAGNOSIS — K589 Irritable bowel syndrome without diarrhea: Secondary | ICD-10-CM | POA: Insufficient documentation

## 2021-03-31 ENCOUNTER — Encounter: Payer: Self-pay | Admitting: Physician Assistant

## 2021-03-31 ENCOUNTER — Other Ambulatory Visit (HOSPITAL_COMMUNITY)
Admission: RE | Admit: 2021-03-31 | Discharge: 2021-03-31 | Disposition: A | Discharge status: Home / Self Care | Payer: No Typology Code available for payment source | Source: Ambulatory Visit | Attending: Physician Assistant | Admitting: Physician Assistant

## 2021-03-31 ENCOUNTER — Ambulatory Visit (INDEPENDENT_AMBULATORY_CARE_PROVIDER_SITE_OTHER): Payer: No Typology Code available for payment source | Admitting: Physician Assistant

## 2021-03-31 VITALS — BP 118/86 | HR 102 | Temp 98.7°F | Ht 67.0 in | Wt 295.0 lb

## 2021-03-31 DIAGNOSIS — Z23 Encounter for immunization: Secondary | ICD-10-CM | POA: Diagnosis not present

## 2021-03-31 DIAGNOSIS — Z0000 Encounter for general adult medical examination without abnormal findings: Secondary | ICD-10-CM

## 2021-03-31 DIAGNOSIS — Z6841 Body Mass Index (BMI) 40.0 and over, adult: Secondary | ICD-10-CM

## 2021-03-31 DIAGNOSIS — F603 Borderline personality disorder: Secondary | ICD-10-CM

## 2021-03-31 DIAGNOSIS — Z124 Encounter for screening for malignant neoplasm of cervix: Secondary | ICD-10-CM | POA: Insufficient documentation

## 2021-03-31 DIAGNOSIS — R600 Localized edema: Secondary | ICD-10-CM

## 2021-03-31 NOTE — Progress Notes (Signed)
Subjective:    Leslie Douglas is a 56 y.o. female and is here for a comprehensive physical exam.   HPI  There are no preventive care reminders to display for this patient.  Acute Concerns: None reported.   Chronic Issues: Anxiety/Depression; Borderline Personality Disorder Over the past couple of months, Leslie Douglas has tried multiple medication regimens including TMS therapy and the selegiline patch. Unfortunately she had an allergic reaction to the patch and found that the TMS therapy didn't work. In the midst of this she has continued following up with Dr. Rene Kocher, via telehealth. Unfortunately due to her provider's limited ability to trial anymore medications, she is in need of an additional provider that could assist her in finding the right medication. There is the option to trial the selegiline oral medication, but due to the extensive food restrictions, she does not want to take it. Despite this she is managing well and in need of recommendations.   Lower Leg Edema  Pt is compliant with taking lasix 20 mg daily prn with no adverse effects. At this time she has found that she hadn't needed it. She is managing well at this time.   Chronic Colitis  Pt recently followed up with Dr. Meridee Score, gastroenterology on 03/25/21 where she reported the xifaxan therapy hadn't been effective in changing her bowel habits. She also expressed hope that if she transitioned into a biological therapy such as Entyvio that she may have some further improvement of GI sx. Since [name] has trialed multiple regimens including IBS-D rifaximin treatment and bulking fibers, her chronic diarrheal sx persist. Per Dr. Elesa Hacker notes, following Leslie Douglas's updated colonoscopy next month, if she still shows signs of active chronic colitis then transitioning to [facility] is reasonable.  Despite all of this, pt is still compliant with taking lialda 1.2 g four times daily with no complications.    Health  Maintenance: Immunizations -- Covid-  UTD Influenza- UTD Tdap-  UTD;2020 Colonoscopy -- UTD;2021 Mammogram -- UTD;2022 PAP -- Due;2019 Dentistry- Will update when needed Ophthalmology- Will update when needed Bone Density -- N/A Diet -- Eats all food groups; trying to work on not binging Sleep habits -- No concerns Exercise -- As able Weight -- Stable Mood -- Stable; No red flags  Weight history: Wt Readings from Last 10 Encounters:  03/31/21 295 lb (133.8 kg)  03/25/21 294 lb (133.4 kg)  02/19/21 (!) 302 lb (137 kg)  01/07/21 (!) 302 lb (137 kg)  10/29/20 (!) 310 lb (140.6 kg)  07/15/20 (!) 314 lb 2 oz (142.5 kg)  11/27/19 (!) 311 lb (141.1 kg)  10/31/19 (!) 313 lb (142 kg)  10/23/19 (!) 311 lb (141.1 kg)  10/02/19 (!) 308 lb (139.7 kg)   Body mass index is 46.2 kg/m. No LMP recorded. Patient is postmenopausal. Alcohol use:  reports current alcohol use. Tobacco use:  Tobacco Use: Low Risk    Smoking Tobacco Use: Never   Smokeless Tobacco Use: Never   Passive Exposure: Not on file     Depression screen [facility] 2/9 10/31/2019  Decreased Interest 3  Down, Depressed, Hopeless 3  PHQ - 2 Score 6  Altered sleeping 1  Tired, decreased energy 2  Change in appetite 3  Feeling bad or failure about yourself  2  Trouble concentrating 3  Moving slowly or fidgety/restless 0  Suicidal thoughts 0  PHQ-9 Score 17  Difficult doing work/chores Somewhat difficult     Other providers/specialists: Patient Care Team: Jarold Motto, [place] as PCP -  General (Physician Assistant) Alfonse Spruce, MD as Consulting Physician (Allergy and Immunology) Mansouraty, Netty Starring., MD as Consulting Physician (Gastroenterology) Van Clines, MD as Consulting Physician (Neurology)    PMHx, SurgHx, SocialHx, Medications, and Allergies were reviewed in the Visit Navigator and updated as appropriate.   Past Medical History:  Diagnosis Date   Allergy    Anxiety    Back pain    Binge  eating disorder    Borderline personality disorder (HCC)    COPD (chronic obstructive pulmonary disease) (HCC)    chronic bronchitis   Depression    Edema, lower extremity    Hip pain    History of chicken pox    Joint pain    Memory loss    Pancolitis (HCC)    Poor concentration    Seasonal allergies    Skin cancer, basal cell    basal cell   Tinnitus    Vitamin D deficiency      Past Surgical History:  Procedure Laterality Date   CHOLECYSTECTOMY  2003   MOHS SURGERY  10/2017   SKIN SURGERY  05/2020   nose skin cancer   VAGINAL DELIVERY  1995   WISDOM TOOTH EXTRACTION  12/2017     Family History  Problem Relation Age of Onset   Hypertension Mother    Diabetes Father    Colon cancer Neg Hx    Esophageal cancer Neg Hx    Rectal cancer Neg Hx    Stomach cancer Neg Hx    Pancreatic cancer Neg Hx    Inflammatory bowel disease Neg Hx    Liver disease Neg Hx    Allergic rhinitis Neg Hx    Angioedema Neg Hx    Asthma Neg Hx    Eczema Neg Hx    Urticaria Neg Hx    Immunodeficiency Neg Hx    Atopy Neg Hx     Social History   Tobacco Use   Smoking status: Never   Smokeless tobacco: Never  Vaping Use   Vaping Use: Never used  Substance Use Topics   Alcohol use: Yes    Comment: rarely   Drug use: Never    Review of Systems:   Review of Systems  Constitutional:  Negative for chills, fever, malaise/fatigue and weight loss.  HENT:  Negative for hearing loss, sinus pain and sore throat.   Respiratory:  Negative for cough and hemoptysis.   Cardiovascular:  Negative for chest pain, palpitations, leg swelling and PND.  Gastrointestinal:  Negative for abdominal pain, constipation, diarrhea, heartburn, nausea and vomiting.  Genitourinary:  Negative for dysuria, frequency and urgency.  Musculoskeletal:  Negative for back pain, myalgias and neck pain.  Skin:  Negative for itching and rash.  Neurological:  Negative for dizziness, tingling, seizures and headaches.   Endo/Heme/Allergies:  Negative for polydipsia.  Psychiatric/Behavioral:  Negative for depression. The patient is not nervous/anxious.    Objective:   BP 118/86   Pulse (!) 102   Temp 98.7 F (37.1 C)   Ht 5\' 7"  (1.702 m)   Wt 295 lb (133.8 kg)   SpO2 96%   BMI 46.20 kg/m  Body mass index is 46.2 kg/m.   General Appearance:    Alert, cooperative, no distress, appears stated age  Head:    Normocephalic, without obvious abnormality, atraumatic  Eyes:    PERRL, conjunctiva/corneas clear, EOM's intact, fundi    benign, both eyes  Ears:    Normal TM's and external ear canals, both  ears  Nose:   Nares normal, septum midline, mucosa normal, no drainage    or sinus tenderness  Throat:   Lips, mucosa, and tongue normal; teeth and gums normal  Neck:   Supple, symmetrical, trachea midline, no adenopathy;    thyroid:  no enlargement/tenderness/nodules; no carotid   bruit or JVD  Back:     Symmetric, no curvature, ROM normal, no CVA tenderness  Lungs:     Clear to auscultation bilaterally, respirations unlabored  Chest Wall:    No tenderness or deformity   Heart:    Regular rate and rhythm, S1 and S2 normal, no murmur, rub or gallop  Breast Exam:    Deferred  Abdomen:     Soft, non-tender, bowel sounds active all four quadrants,    no masses, no organomegaly  Genitalia:   Deferred  Extremities:   Extremities normal, atraumatic, no cyanosis or edema  Pulses:   2+ and symmetric all extremities  Skin:   Skin color, texture, turgor normal, no rashes or lesions  Lymph nodes:   Cervical, supraclavicular, and axillary nodes normal  Neurologic:   CNII-XII intact, normal strength, sensation and reflexes    throughout    Assessment/Plan:   Routine physical examination Today patient counseled on age appropriate routine health concerns for screening and prevention, each reviewed and up to date or declined. Immunizations reviewed and up to date or declined. Labs ordered and reviewed. Risk  factors for depression reviewed and negative. Hearing function and visual acuity are intact. ADLs screened and addressed as needed. Functional ability and level of safety reviewed and appropriate. Education, counseling and referrals performed based on assessed risks today. Patient provided with a copy of personalized plan for preventive services.  Borderline personality disorder (HCC); Anxiety and Depression Remains uncontrolled Denies SI/HI Currently trying to find a new psychiatrist I discussed with patient that if they develop any SI, to tell someone immediately and seek medical attention.  Need for immunization against influenza Completed today  Pap smear for cervical cancer screening Completed today   Class 3 severe obesity with serious comorbidity and body mass index (BMI) of 45.0 to 49.9 in adult, unspecified obesity type (HCC) Encouraged patient to participate in healthy eating and regular exercise  Unable to tolerate Saxenda, I do feel as though all GLP-1's are a relative contraindication for her   Patient Counseling: [x]    Nutrition: Stressed importance of moderation in sodium/caffeine intake, saturated fat and cholesterol, caloric balance, sufficient intake of fresh fruits, vegetables, fiber, calcium, iron, and 1 mg of folate supplement per day (for females capable of pregnancy).  [x]    Stressed the importance of regular exercise.   [x]    Substance Abuse: Discussed cessation/primary prevention of tobacco, alcohol, or other drug use; driving or other dangerous activities under the influence; availability of treatment for abuse.   [x]    Injury prevention: Discussed safety belts, safety helmets, smoke detector, smoking near bedding or upholstery.   [x]    Sexuality: Discussed sexually transmitted diseases, partner selection, use of condoms, avoidance of unintended pregnancy  and contraceptive alternatives.  [x]    Dental health: Discussed importance of regular tooth brushing, flossing,  and dental visits.  [x]    Health maintenance and immunizations reviewed. Please refer to Health maintenance section.   I,Havlyn C Ratchford,acting as a Neurosurgeon for [employer], PA.,have documented all relevant documentation on the behalf of Jarold Motto, PA,as directed by  Jarold Motto, PA while in the presence of Jarold Motto, [place].  I, Jarold Motto,  PA, have reviewed all documentation for this visit. The documentation on 03/31/21 for the exam, diagnosis, procedures, and orders are all accurate and complete.  Jarold Motto, PA-C [place] [place] [facility]

## 2021-03-31 NOTE — Patient Instructions (Signed)
It was great to see you! ? ?Please go to the lab for blood work.  ? ?Our office will call you with your results unless you have chosen to receive results via MyChart. ? ?If your blood work is normal we will follow-up each year for physicals and as scheduled for chronic medical problems. ? ?If anything is abnormal we will treat accordingly and get you in for a follow-up. ? ?Take care, ? ?Annice Jolly ?  ? ? ?

## 2021-04-01 LAB — LIPID PANEL
Cholesterol: 225 mg/dL — ABNORMAL HIGH (ref 0–200)
HDL: 54.1 mg/dL (ref >39.00)
LDL Cholesterol: 140 mg/dL — ABNORMAL HIGH (ref 0–99)
NonHDL: 170.53
Total CHOL/HDL Ratio: 4
Triglycerides: 154 mg/dL — ABNORMAL HIGH (ref 0.0–149.0)
VLDL: 30.8 mg/dL (ref 0.0–40.0)

## 2021-04-01 LAB — COMPREHENSIVE METABOLIC PANEL
ALT: 40 U/L — ABNORMAL HIGH (ref 0–35)
AST: 29 U/L (ref 0–37)
Albumin: 4.4 g/dL (ref 3.5–5.2)
Alkaline Phosphatase: 60 U/L (ref 39–117)
BUN: 15 mg/dL (ref 6–23)
CO2: 31 mEq/L (ref 19–32)
Calcium: 9.5 mg/dL (ref 8.4–10.5)
Chloride: 101 mEq/L (ref 96–112)
Creatinine, Ser: 1.03 mg/dL (ref 0.40–1.20)
GFR: 60.22 mL/min (ref >60.00)
Glucose, Bld: 63 mg/dL — ABNORMAL LOW (ref 70–99)
Potassium: 4.2 mEq/L (ref 3.5–5.1)
Sodium: 140 mEq/L (ref 135–145)
Total Bilirubin: 0.5 mg/dL (ref 0.2–1.2)
Total Protein: 7.3 g/dL (ref 6.0–8.3)

## 2021-04-01 LAB — CBC WITH DIFFERENTIAL/PLATELET
Basophils Absolute: 0.1 10*3/uL (ref 0.0–0.1)
Basophils Relative: 1.4 % (ref 0.0–3.0)
Eosinophils Absolute: 0.1 10*3/uL (ref 0.0–0.7)
Eosinophils Relative: 1.1 % (ref 0.0–5.0)
HCT: 40.2 % (ref 36.0–46.0)
Hemoglobin: 13.5 g/dL (ref 12.0–15.0)
Lymphocytes Relative: 38.2 % (ref 12.0–46.0)
Lymphs Abs: 2.4 10*3/uL (ref 0.7–4.0)
MCHC: 33.5 g/dL (ref 30.0–36.0)
MCV: 89.4 fl (ref 78.0–100.0)
Monocytes Absolute: 0.3 10*3/uL (ref 0.1–1.0)
Monocytes Relative: 5.4 % (ref 3.0–12.0)
Neutro Abs: 3.3 10*3/uL (ref 1.4–7.7)
Neutrophils Relative %: 53.9 % (ref 43.0–77.0)
Platelets: 333 10*3/uL (ref 150.0–400.0)
RBC: 4.5 Mil/uL (ref 3.87–5.11)
RDW: 13.4 % (ref 11.5–15.5)
WBC: 6.2 10*3/uL (ref 4.0–10.5)

## 2021-04-02 LAB — CYTOLOGY - PAP
Comment: NEGATIVE
Diagnosis: NEGATIVE
High risk HPV: NEGATIVE

## 2021-04-23 ENCOUNTER — Encounter: Payer: Self-pay | Admitting: Certified Registered Nurse Anesthetist

## 2021-04-28 ENCOUNTER — Encounter: Payer: Self-pay | Admitting: Certified Registered Nurse Anesthetist

## 2021-04-29 ENCOUNTER — Ambulatory Visit (AMBULATORY_SURGERY_CENTER): Payer: No Typology Code available for payment source | Admitting: Gastroenterology

## 2021-04-29 ENCOUNTER — Encounter: Payer: Self-pay | Admitting: Gastroenterology

## 2021-04-29 VITALS — BP 127/83 | HR 77 | Temp 98.9°F | Resp 12 | Ht 67.0 in | Wt 295.0 lb

## 2021-04-29 DIAGNOSIS — K52832 Lymphocytic colitis: Secondary | ICD-10-CM

## 2021-04-29 DIAGNOSIS — K52839 Microscopic colitis, unspecified: Secondary | ICD-10-CM

## 2021-04-29 DIAGNOSIS — K529 Noninfective gastroenteritis and colitis, unspecified: Secondary | ICD-10-CM

## 2021-04-29 MED ORDER — SODIUM CHLORIDE 0.9 % IV SOLN: 500.0000 mL | Freq: Once | INTRAVENOUS | Status: DC

## 2021-04-29 NOTE — Patient Instructions (Signed)
Handout on hemorrhoids provided  ? ?Await pathology results.  ? ?Continue current medications.  ? ?YOU HAD AN ENDOSCOPIC PROCEDURE TODAY AT [facility]:   Refer to the procedure report that was given to you for any specific questions about what was found during the examination.  If the procedure report does not answer your questions, please call your gastroenterologist to clarify.  If you requested that your care partner not be given the details of your procedure findings, then the procedure report has been included in a sealed envelope for you to review at your convenience later. ? ?YOU SHOULD EXPECT: Some feelings of bloating in the abdomen. Passage of more gas than usual.  Walking can help get rid of the air that was put into your GI tract during the procedure and reduce the bloating. If you had a lower endoscopy (such as a colonoscopy or flexible sigmoidoscopy) you may notice spotting of blood in your stool or on the toilet paper. If you underwent a bowel prep for your procedure, you may not have a normal bowel movement for a few days. ? ?Please Note:  You might notice some irritation and congestion in your nose or some drainage.  This is from the oxygen used during your procedure.  There is no need for concern and it should clear up in a day or so. ? ?SYMPTOMS TO REPORT IMMEDIATELY: ? ?Following lower endoscopy (colonoscopy or flexible sigmoidoscopy): ? Excessive amounts of blood in the stool ? Significant tenderness or worsening of abdominal pains ? Swelling of the abdomen that is new, acute ? Fever of 100?F or higher ? ?For urgent or emergent issues, a gastroenterologist can be reached at any hour by calling [phone]. ?Do not use MyChart messaging for urgent concerns.  ? ? ?DIET:  We do recommend a small meal at first, but then you may proceed to your regular diet.  Drink plenty of fluids but you should avoid alcoholic beverages for 24 hours. ? ?ACTIVITY:  You should plan to take it  easy for the rest of today and you should NOT DRIVE or use heavy machinery until tomorrow (because of the sedation medicines used during the test).   ? ?FOLLOW UP: ?Our staff will call the number listed on your records 48-72 hours following your procedure to check on you and address any questions or concerns that you may have regarding the information given to you following your procedure. If we do not reach you, we will leave a message.  We will attempt to reach you two times.  During this call, we will ask if you have developed any symptoms of COVID 19. If you develop any symptoms (ie: fever, flu-like symptoms, shortness of breath, cough etc.) before then, please call [phone].  If you test positive for Covid 19 in the 2 weeks post procedure, please call and report this information to [name].   ? ?If any biopsies were taken you will be contacted by phone or by letter within the next 1-3 weeks.  Please call us at [phone] if you have not heard about the biopsies in 3 weeks.  ? ? ?SIGNATURES/CONFIDENTIALITY: ?You and/or your care partner have signed paperwork which will be entered into your electronic medical record.  These signatures attest to the fact that that the information above on your After Visit Summary has been reviewed and is understood.  Full responsibility of the confidentiality of this discharge information lies with you and/or your care-partner. ? ? ?

## 2021-04-29 NOTE — Progress Notes (Signed)
Vs by a shelton in adm ?

## 2021-04-29 NOTE — Progress Notes (Signed)
? ?GASTROENTEROLOGY PROCEDURE H&P NOTE  ? ?Primary Care Physician: ?Inda Coke, PA ? ?HPI: ?Leslie Douglas is a 56 y.o. female who presents for Colonoscopy for follow up of chronic colitis and IBS with persistent symptoms to see whether disease is still present and step up therapy is to be required. ? ?Past Medical History:  ?Diagnosis Date  ? Allergy   ? Anxiety   ? Back pain   ? Binge eating disorder   ? Borderline personality disorder ([place])   ? COPD (chronic obstructive pulmonary disease) ([place])   ? chronic bronchitis  ? Depression   ? Edema, lower extremity   ? Hip pain   ? History of chicken pox   ? Joint pain   ? Memory loss   ? Pancolitis ([place])   ? Poor concentration   ? Seasonal allergies   ? Skin cancer, basal cell   ? basal cell  ? Tinnitus   ? Vitamin D deficiency   ? ?Past Surgical History:  ?Procedure Laterality Date  ? CHOLECYSTECTOMY  2003  ? MOHS SURGERY  10/2017  ? SKIN SURGERY  05/2020  ? nose skin cancer  ? VAGINAL DELIVERY  1995  ? WISDOM TOOTH EXTRACTION  12/2017  ? ?Current Outpatient Medications  ?Medication Sig Dispense Refill  ? ARIPiprazole (ABILIFY) 5 MG tablet Take 5 mg by mouth daily.    ? mesalamine (LIALDA) 1.2 g EC tablet TAKE FOUR TABLETS BY MOUTH DAILY WITH BREAKFAST 120 tablet 1  ? propranolol (INDERAL) 10 MG tablet Take 1 tablet by mouth 3 (three) times daily as needed.    ? benzonatate (TESSALON) 200 MG capsule Take 1 capsule (200 mg total) by mouth 2 (two) times daily as needed for cough. (Patient not taking: Reported on 04/29/2021) 20 capsule 0  ? Biotin w/ Vitamins C & E (HAIR/SKIN/NAILS PO) Take by mouth.    ? cholecalciferol (VITAMIN D3) 25 MCG (1000 UT) tablet Take 1,000 Units by mouth daily.    ? EPINEPHrine (EPIPEN 2-PAK) 0.3 mg/0.3 mL IJ SOAJ injection Inject 0.3 mLs (0.3 mg total) into the muscle as needed for anaphylaxis. 1 each 1  ? fluticasone (FLONASE) 50 MCG/ACT nasal spray Place 2 sprays into both nostrils daily as needed for allergies or rhinitis. 16 g 5   ? furosemide (LASIX) 20 MG tablet Take 1 tablet (20 mg total) by mouth daily as needed for fluid. 30 tablet 0  ? ipratropium (ATROVENT) 0.03 % nasal spray Place 1 spray into both nostrils 4 (four) times daily. Use as directed. (Patient taking differently: Place 1 spray into both nostrils 4 (four) times daily as needed. Use as directed.) 30 mL 5  ? levocetirizine (XYZAL) 5 MG tablet Take 1 tablet (5 mg total) by mouth every evening. (Patient taking differently: Take 5 mg by mouth at bedtime as needed.) 30 tablet 5  ? Multiple Vitamin (MULTIVITAMIN) tablet Take 1 tablet by mouth daily.    ? ondansetron (ZOFRAN ODT) 8 MG disintegrating tablet Take 1 tablet (8 mg total) by mouth every 8 (eight) hours as needed for nausea or vomiting. 20 tablet 4  ? ?Current Facility-Administered Medications  ?Medication Dose Route Frequency Provider Last Rate Last Admin  ? 0.9 %  sodium chloride infusion  500 mL Intravenous Once Mansouraty, Telford Nab., MD      ? ? ?Current Outpatient Medications:  ?  ARIPiprazole (ABILIFY) 5 MG tablet, Take 5 mg by mouth daily., Disp: , Rfl:  ?  mesalamine (LIALDA) 1.2 g EC  tablet, TAKE FOUR TABLETS BY MOUTH DAILY WITH BREAKFAST, Disp: 120 tablet, Rfl: 1 ?  propranolol (INDERAL) 10 MG tablet, Take 1 tablet by mouth 3 (three) times daily as needed., Disp: , Rfl:  ?  benzonatate (TESSALON) 200 MG capsule, Take 1 capsule (200 mg total) by mouth 2 (two) times daily as needed for cough. (Patient not taking: Reported on 04/29/2021), Disp: 20 capsule, Rfl: 0 ?  Biotin w/ Vitamins C & E (HAIR/SKIN/NAILS PO), Take by mouth., Disp: , Rfl:  ?  cholecalciferol (VITAMIN D3) 25 MCG (1000 UT) tablet, Take 1,000 Units by mouth daily., Disp: , Rfl:  ?  EPINEPHrine (EPIPEN 2-PAK) 0.3 mg/0.3 mL IJ SOAJ injection, Inject 0.3 mLs (0.3 mg total) into the muscle as needed for anaphylaxis., Disp: 1 each, Rfl: 1 ?  fluticasone (FLONASE) 50 MCG/ACT nasal spray, Place 2 sprays into both nostrils daily as needed for allergies or  rhinitis., Disp: 16 g, Rfl: 5 ?  furosemide (LASIX) 20 MG tablet, Take 1 tablet (20 mg total) by mouth daily as needed for fluid., Disp: 30 tablet, Rfl: 0 ?  ipratropium (ATROVENT) 0.03 % nasal spray, Place 1 spray into both nostrils 4 (four) times daily. Use as directed. (Patient taking differently: Place 1 spray into both nostrils 4 (four) times daily as needed. Use as directed.), Disp: 30 mL, Rfl: 5 ?  levocetirizine (XYZAL) 5 MG tablet, Take 1 tablet (5 mg total) by mouth every evening. (Patient taking differently: Take 5 mg by mouth at bedtime as needed.), Disp: 30 tablet, Rfl: 5 ?  Multiple Vitamin (MULTIVITAMIN) tablet, Take 1 tablet by mouth daily., Disp: , Rfl:  ?  ondansetron (ZOFRAN ODT) 8 MG disintegrating tablet, Take 1 tablet (8 mg total) by mouth every 8 (eight) hours as needed for nausea or vomiting., Disp: 20 tablet, Rfl: 4 ? ?Current Facility-Administered Medications:  ?  0.9 %  sodium chloride infusion, 500 mL, Intravenous, Once, Mansouraty, Telford Nab., MD ?Allergies  ?Allergen Reactions  ? Doxycycline Rash and Swelling  ?  Mouth and face  ? Erythromycin Shortness Of Breath  ? Sulfa Antibiotics Shortness Of Breath  ? Saxenda [Liraglutide -Weight Management] Nausea Only  ? Penicillins Rash  ? ?Family History  ?Problem Relation Age of Onset  ? Hypertension Mother   ? Diabetes Father   ? Colon cancer Neg Hx   ? Esophageal cancer Neg Hx   ? Rectal cancer Neg Hx   ? Stomach cancer Neg Hx   ? Pancreatic cancer Neg Hx   ? Inflammatory bowel disease Neg Hx   ? Liver disease Neg Hx   ? Allergic rhinitis Neg Hx   ? Angioedema Neg Hx   ? Asthma Neg Hx   ? Eczema Neg Hx   ? Urticaria Neg Hx   ? Immunodeficiency Neg Hx   ? Atopy Neg Hx   ? ?Social History  ? ?Socioeconomic History  ? Marital status: Legally Separated  ?  Spouse name: Not on file  ? Number of children: 3  ? Years of education: Not on file  ? Highest education level: Not on file  ?Occupational History  ? Occupation: Aeronautical engineer  ?   Comment: [employer]  ?Tobacco Use  ? Smoking status: Never  ? Smokeless tobacco: Never  ?Vaping Use  ? Vaping Use: Never used  ?Substance and Sexual Activity  ? Alcohol use: Yes  ?  Comment: rarely  ? Drug use: Never  ? Sexual activity: Not Currently  ?  Birth control/protection:  None  ?Other Topics Concern  ? Not on file  ?Social History Narrative  ? Divorced  ? Works for [employer], has Associate Degree  ? 3 children  ? Right handed   ? ?Social Determinants of Health  ? ?Financial Resource Strain: Not on file  ?Food Insecurity: Not on file  ?Transportation Needs: Not on file  ?Physical Activity: Not on file  ?Stress: Not on file  ?Social Connections: Not on file  ?Intimate Partner Violence: Not on file  ? ? ?Physical Exam: ?Today's Vitals  ? 04/29/21 0912  ?BP: (!) 154/119  ?Pulse: 95  ?Temp: 98.9 ?F (37.2 ?C)  ?TempSrc: Skin  ?SpO2: 100%  ?Weight: 295 lb (133.8 kg)  ?Height: 5' 7"  (1.702 m)  ? ?Body mass index is 46.2 kg/m?. ?GEN: NAD ?EYE: Sclerae anicteric ?ENT: MMM ?CV: Non-tachycardic ?GI: Soft, NT/ND ?NEURO:  Alert & Oriented x 3 ? ?Lab Results: ?No results for input(s): WBC, HGB, HCT, PLT in the last 72 hours. ?BMET ?No results for input(s): NA, K, CL, CO2, GLUCOSE, BUN, CREATININE, CALCIUM in the last 72 hours. ?LFT ?No results for input(s): PROT, ALBUMIN, AST, ALT, ALKPHOS, BILITOT, BILIDIR, IBILI in the last 72 hours. ?PT/INR ?No results for input(s): LABPROT, INR in the last 72 hours. ? ? ?Impression / Plan: ?This is a 56 y.o.female who presents for Colonoscopy for follow up of chronic colitis and IBS with persistent symptoms to see whether disease is still present and step up therapy is to be required. ? ?The risks and benefits of endoscopic evaluation/treatment were discussed with the patient and/or family; these include but are not limited to the risk of perforation, infection, bleeding, missed lesions, lack of diagnosis, severe illness requiring hospitalization, as well as anesthesia and  sedation related illnesses.  The patient's history has been reviewed, patient examined, no change in status, and deemed stable for procedure.  The patient and/or family is agreeable to proceed.  ? ? ?Valarie Merino Man

## 2021-04-29 NOTE — Op Note (Signed)
[facility] ?Patient Name: Leslie Douglas ?Procedure Date: 04/29/2021 10:03 AM ?MRN: [id number] ?Endoscopist: Justice Britain , MD ?Age: 56 ?Referring MD:  ?Date of Birth: [birth year] ?Gender: Female ?Account #: [id number] ?Procedure:                Colonoscopy ?Indications:              Follow-up of ulcerative colitis, For therapy of  ?                          ulcerative colitis, Chronic diarrhea ?Medicines:                Monitored Anesthesia Care ?Procedure:                Pre-Anesthesia Assessment: ?                          - Prior to the procedure, a History and Physical  ?                          was performed, and patient medications and  ?                          allergies were reviewed. The patient's tolerance of  ?                          previous anesthesia was also reviewed. The risks  ?                          and benefits of the procedure and the sedation  ?                          options and risks were discussed with the patient.  ?                          All questions were answered, and informed consent  ?                          was obtained. Prior Anticoagulants: The patient has  ?                          taken no previous anticoagulant or antiplatelet  ?                          agents. ASA Grade Assessment: III - A patient with  ?                          severe systemic disease. After reviewing the risks  ?                          and benefits, the patient was deemed in  ?                          satisfactory condition to undergo the procedure. ?  After obtaining informed consent, the colonoscope  ?                          was passed under direct vision. Throughout the  ?                          procedure, the patient's blood pressure, pulse, and  ?                          oxygen saturations were monitored continuously. The  ?                          CF [id number] #[id number] was introduced through the anus  ?                          and advanced to  the 8 cm into the ileum. The  ?                          colonoscopy was performed without difficulty. The  ?                          patient tolerated the procedure. The quality of the  ?                          bowel preparation was good. The terminal ileum,  ?                          ileocecal valve, appendiceal orifice, and rectum  ?                          were photographed. ?Scope In: 10:06:40 AM ?Scope Out: 10:23:26 AM ?Scope Withdrawal Time: 0 hours 14 minutes 11 seconds  ?Total Procedure Duration: 0 hours 16 minutes 46 seconds  ?Findings:                 The digital rectal exam findings include  ?                          hemorrhoids. Pertinent negatives include no  ?                          palpable rectal lesions. ?                          The terminal ileum and ileocecal valve appeared  ?                          normal. Biopsies were taken with a cold forceps for  ?                          histology. ?                          Normal mucosa was found in the sigmoid colon, in  ?  the descending colon, in the transverse colon, at  ?                          the hepatic flexure, in the ascending colon and in  ?                          the cecum. ?                          Patchy areas of granular mucosa were found in the  ?                          rectum and in the recto-sigmoid colon region. ?                          Due to history of chronic colitis and with desire  ?                          to bettern understand if chronic colitis is still  ?                          playing a role with her symptoms, we did  ?                          surveillance biopsies. Biopsies were taken with a  ?                          cold forceps Right Colon for histology. Biopsies  ?                          were taken with a cold forceps Left colon for  ?                          histology. Biopsies were taken with a cold forceps  ?                          in the rectum for histology. ?                           Non-bleeding non-thrombosed internal hemorrhoids  ?                          were found during retroflexion, during perianal  ?                          exam and during digital exam. The hemorrhoids were  ?                          Grade II (internal hemorrhoids that prolapse but  ?                          reduce spontaneously). ?Complications:            No immediate complications. ?Estimated Blood Loss:  Estimated blood loss was minimal. ?Impression:               - Hemorrhoids found on digital rectal exam. ?                          - The examined portion of the ileum was normal.  ?                          Biopsied. ?                          - Normal mucosa in the sigmoid colon, in the  ?                          descending colon, in the transverse colon, at the  ?                          hepatic flexure, in the ascending colon and in the  ?                          cecum. Granularity in the rectum and in the  ?                          recto-sigmoid colon. Surveillance IBD biopsies were  ?                          obtained as outlined above. ?                          - Non-bleeding non-thrombosed internal hemorrhoids. ?Recommendation:           - The patient will be observed post-procedure,  ?                          until all discharge criteria are met. ?                          - Discharge patient to home. ?                          - Patient has a contact number available for  ?                          emergencies. The signs and symptoms of potential  ?                          delayed complications were discussed with the  ?                          patient. Return to normal activities tomorrow.  ?                          Written discharge instructions were provided to the  ?  patient. ?                          - High fiber diet. ?                          - Continue present medications. ?                          - Await pathology results. ?                           - Repeat colonoscopy in 1-2 years for surveillance  ?                          or earlier if evaluating disease activity or  ?                          medication adjustments. ?                          - Based on pathology if there remains evidence of  ?                          chronic colitis, then transitioning or adding  ?                          Weyman Rodney will likely be next steps in our treatment  ?                          algorithm. ?                          - The findings and recommendations were discussed  ?                          with the patient. ?                          - The findings and recommendations were discussed  ?                          with the designated responsible adult. ?Justice Britain, MD ?04/29/2021 10:33:19 AM ?

## 2021-04-29 NOTE — Progress Notes (Signed)
Called to room to assist during endoscopic procedure.  Patient ID and intended procedure confirmed with present staff. Received instructions for my participation in the procedure from the performing physician.  

## 2021-04-29 NOTE — Progress Notes (Signed)
Report given to PACU, vss 

## 2021-05-01 ENCOUNTER — Telehealth: Payer: Self-pay

## 2021-05-01 NOTE — Telephone Encounter (Signed)
Left message on answering machine. 

## 2021-05-04 ENCOUNTER — Encounter: Payer: Self-pay | Admitting: Gastroenterology

## 2021-05-05 ENCOUNTER — Encounter: Payer: Self-pay | Admitting: Gastroenterology

## 2021-05-05 ENCOUNTER — Other Ambulatory Visit: Payer: Self-pay

## 2021-05-05 MED ORDER — BISMUTH SUBSALICYLATE 262 MG PO TABS: 786.0000 mg | ORAL_TABLET | Freq: Three times a day (TID) | ORAL | 0 refills | Status: AC

## 2021-05-19 ENCOUNTER — Ambulatory Visit (INDEPENDENT_AMBULATORY_CARE_PROVIDER_SITE_OTHER): Payer: No Typology Code available for payment source | Admitting: Psychiatry

## 2021-05-19 ENCOUNTER — Encounter: Payer: Self-pay | Admitting: Psychiatry

## 2021-05-19 VITALS — BP 139/93 | HR 71 | Temp 98.3°F | Wt 293.2 lb

## 2021-05-19 DIAGNOSIS — F609 Personality disorder, unspecified: Secondary | ICD-10-CM | POA: Diagnosis not present

## 2021-05-19 DIAGNOSIS — F419 Anxiety disorder, unspecified: Secondary | ICD-10-CM | POA: Diagnosis not present

## 2021-05-19 DIAGNOSIS — Z79899 Other long term (current) drug therapy: Secondary | ICD-10-CM | POA: Diagnosis not present

## 2021-05-19 DIAGNOSIS — R030 Elevated blood-pressure reading, without diagnosis of hypertension: Secondary | ICD-10-CM

## 2021-05-19 DIAGNOSIS — F332 Major depressive disorder, recurrent severe without psychotic features: Secondary | ICD-10-CM

## 2021-05-19 DIAGNOSIS — Z9189 Other specified personal risk factors, not elsewhere classified: Secondary | ICD-10-CM

## 2021-05-19 DIAGNOSIS — F411 Generalized anxiety disorder: Secondary | ICD-10-CM | POA: Insufficient documentation

## 2021-05-19 MED ORDER — PROPRANOLOL HCL 10 MG PO TABS: 10.0000 mg | ORAL_TABLET | Freq: Three times a day (TID) | ORAL | 1 refills | Status: AC | PRN

## 2021-05-19 MED ORDER — VILAZODONE HCL 20 MG PO TABS: 10.0000 mg | ORAL_TABLET | Freq: Every day | ORAL | 1 refills | Status: DC

## 2021-05-19 MED ORDER — HYDROXYZINE PAMOATE 25 MG PO CAPS: 25.0000 mg | ORAL_CAPSULE | Freq: Every evening | ORAL | 1 refills | Status: DC | PRN

## 2021-05-19 NOTE — Patient Instructions (Addendum)
Please call for EKG - [phone] ? ? ?Vilazodone Tablets ?What is this medication? ?VILAZODONE (vil [place] oh done) treats depression. It increases the amount of serotonin in the brain, a substance that helps regulate mood. ?This medicine may be used for other purposes; ask your health care provider or pharmacist if you have questions. ?COMMON BRAND NAME(S): [id number] ?What should I tell my care team before I take this medication? ?They need to know if you have any of these conditions: ?Bipolar disorder or a family history of bipolar disorder ?Glaucoma ?Liver disease ?Low levels of sodium in the blood ?Receiving electroconvulsive therapy ?Seizures (convulsions) ?Suicidal thoughts, plans, or attempt by you or a family member ?An unusual or allergic reaction to vilazodone, other medications, foods, dyes or preservatives ?Pregnant or trying to get pregnant ?Breast-feeding ?How should I use this medication? ?Take this medication by mouth with water. Take it as directed on the prescription label at the same time every day. Take it with food. Keep taking it unless your care team tells you to stop. ?A special MedGuide will be given to you by the pharmacist with each prescription and refill. Be sure to read this information carefully each time. ?Talk to your care team about the use of this medication in children. Special care may be needed. ?Overdosage: If you think you have taken too much of this medicine contact a poison control center or emergency room at once. ?NOTE: This medicine is only for you. Do not share this medicine with others. ?What if I miss a dose? ?If you miss a dose, take it as soon as you can. If it is almost time for your next dose, take only that dose. Do not take double or extra doses. ?What may interact with this medication? ?Do not take this medication with any of the following: ?Linezolid ?MAOIs like Carbex, Eldepryl, Marplan, Nardil, and Parnate ?Methylene blue (injected into a vein) ?This medication  may also interact with the following: ?Amphetamines ?Aspirin and aspirin-like medications ?Buspirone ?Certain diet medications like dexfenfluramine, fenfluramine, phentermine, sibutramine ?Certain migraine headache medications like almotriptan, eletriptan, frovatriptan, naratriptan, rizatriptan, sumatriptan, zolmitriptan ?Certain medications that treat or prevent blood clots like warfarin, enoxaparin, and dalteparin ?Certain medications that treat infections like clarithromycin, itraconazole, voriconazole, ketoconazole, rifampin ?Certain medications that treat seizures like carbamazepine and phenytoin ?Digoxin ?Fentanyl ?Lithium ?NSAIDS, medications for pain and inflammation, like ibuprofen or naproxen ?Other medications for depression, anxiety, or psychotic disturbances ?[place] ?Tramadol ?Tryptophan ?This list may not describe all possible interactions. Give your health care provider a list of all the medicines, herbs, non-prescription drugs, or dietary supplements you use. Also tell them if you smoke, drink alcohol, or use illegal drugs. Some items may interact with your medicine. ?What should I watch for while using this medication? ?Tell your care team if your symptoms do not get better or if they get worse. Visit your care team for regular checks on your progress. Because it may take several weeks to see the full effects of this medication, it is important to continue your treatment as prescribed by your care team. ?Patients and their families should watch out for new or worsening thoughts of suicide or depression. Also watch out for sudden changes in feelings such as feeling anxious, agitated, panicky, irritable, hostile, aggressive, impulsive, severely restless, overly excited and hyperactive, or not being able to sleep. If this happens, especially at the beginning of treatment or after a change in dose, call your care team. ?You may get drowsy or  dizzy. Do not drive, use machinery, or do anything  that needs mental alertness until you know how this medication affects you. Do not stand or sit up quickly, especially if you are an older patient. This reduces the risk of dizzy or fainting spells. Alcohol may interfere with the effect of this medication. Avoid alcoholic drinks. ?Your mouth may get dry. Chewing sugarless gum or sucking hard candy, and drinking plenty of water may help. Contact your care team if the problem does not go away or is severe. ?What side effects may I notice from receiving this medication? ?Side effects that you should report to your care team as soon as possible: ?Allergic reactions--skin rash, itching, hives, swelling of the face, lips, tongue, or throat ?Bleeding--bloody or black, tar-like stools, vomiting blood or brown material that looks like coffee grounds, red or dark brown urine, small red or purple spots on skin, unusual bruising or bleeding ?Irritability, confusion, fast or irregular heartbeat, muscle stiffness, twitching muscles, sweating, high fever, seizure, chills, vomiting, diarrhea, which may be signs of serotonin syndrome ?Low sodium level--muscle weakness, fatigue, dizziness, headache, confusion ?Seizures ?Sudden eye pain or change in vision such as blurry vision, seeing halos around lights, vision loss ?Thoughts of suicide or self-harm, worsening mood, feelings of depression ?Side effects that usually do not require medical attention (report to your care team if they continue or are bothersome): ?Change in sex drive or performance ?Diarrhea ?Dry mouth ?Headache ?Nausea ?Trouble sleeping ?Vomiting ?This list may not describe all possible side effects. Call your doctor for medical advice about side effects. You may report side effects to FDA at 1-800-FDA-1088. ?Where should I keep my medication? ?Keep out of the reach of children. ?Store at room temperature between 15 and 30 degrees C (59 to 86 degrees F). Throw away any unused medication after the expiration  date. ?NOTE: This sheet is a summary. It may not cover all possible information. If you have questions about this medicine, talk to your doctor, pharmacist, or health care provider. ?? 2023 Elsevier/Gold Standard (2020-10-13 00:00:00) ? ?
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
## 2021-05-26 ENCOUNTER — Ambulatory Visit: Payer: No Typology Code available for payment source | Admitting: Gastroenterology

## 2021-05-28 ENCOUNTER — Ambulatory Visit: Payer: No Typology Code available for payment source | Admitting: Psychiatry

## 2021-05-28 LAB — HEMOGLOBIN A1C
Est. average glucose Bld gHb Est-mCnc: 108 mg/dL
Hgb A1c MFr Bld: 5.4 % (ref 4.8–5.6)

## 2021-05-28 LAB — TSH: TSH: 2 u[IU]/mL (ref 0.450–4.500)

## 2021-05-28 LAB — PROLACTIN: Prolactin: 3.6 ng/mL — ABNORMAL LOW (ref 4.8–23.3)

## 2021-05-29 ENCOUNTER — Ambulatory Visit
Admission: RE | Admit: 2021-05-29 | Discharge: 2021-05-29 | Disposition: A | Discharge status: Home / Self Care | Payer: No Typology Code available for payment source | Source: Ambulatory Visit | Attending: Psychiatry | Admitting: Psychiatry

## 2021-05-29 DIAGNOSIS — Z9189 Other specified personal risk factors, not elsewhere classified: Secondary | ICD-10-CM | POA: Diagnosis present

## 2021-06-03 ENCOUNTER — Encounter: Payer: Self-pay | Admitting: Gastroenterology

## 2021-06-03 ENCOUNTER — Ambulatory Visit: Payer: No Typology Code available for payment source | Admitting: Gastroenterology

## 2021-06-03 VITALS — BP 140/96 | HR 89 | Ht 67.0 in | Wt 295.5 lb

## 2021-06-03 DIAGNOSIS — K529 Noninfective gastroenteritis and colitis, unspecified: Secondary | ICD-10-CM | POA: Diagnosis not present

## 2021-06-03 DIAGNOSIS — K52832 Lymphocytic colitis: Secondary | ICD-10-CM

## 2021-06-03 DIAGNOSIS — R152 Fecal urgency: Secondary | ICD-10-CM

## 2021-06-03 NOTE — Patient Instructions (Addendum)
Continue Pepto Bismuth 3 pills three times daily through may. Starting in June transition to Pepto Bismuth 2 pills three times daily, stay on that until you return to clinic.   Follow up Dr Rush Landmark on 07/23/21 @ 3:30pm  If you are age 58 or older, your body mass index should be between 23-30. Your Body mass index is 46.28 kg/m. If this is out of the aforementioned range listed, please consider follow up with your Primary Care Provider.  If you are age 58 or younger, your body mass index should be between 19-25. Your Body mass index is 46.28 kg/m. If this is out of the aformentioned range listed, please consider follow up with your Primary Care Provider.   ________________________________________________________  The [facility] providers would like to encourage you to use [facility] to communicate with providers for non-urgent requests or questions.  Due to long hold times on the telephone, sending your provider a message by [facility] may be a faster and more efficient way to get a response.  Please allow 48 business hours for a response.  Please remember that this is for non-urgent requests.  _______________________________________________________  Thank you for choosing me and [facility].  Dr. Rush Landmark

## 2021-06-04 NOTE — Progress Notes (Signed)
[place] VISIT   Primary Care Provider [place], [place], [place] [address] [place] [address] [phone]  Patient Profile: Leslie Douglas is a 56 y.o. 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  The patient presents to the [facility] for an evaluation and management of problem(s) noted below:  Problem List 1. Lymphocytic colitis   2. IBD (inflammatory bowel disease)   3. Fecal urgency   4. Chronic diarrhea     History of Present Illness: Please see prior notes for full details of HPI.  Interval History The patient returns for follow-up today.  Interestingly, her colonoscopy revealed that she now has evidence of lymphocytic colitis throughout her colon.  I reviewed her prior colon biopsies with pathology and no evidence of lymphocytic colitis was present on her 2 previous colonoscopies.  As the patient has had issues with steroids including budesonide in the past, we elected to move forward with bismuth therapy.  She is taking bismuth therapy currently.  She has felt that this has made some impact in regards to her urgency as well as the number of bowel movements.  She is now having less bowel movements per day.  She continues to work with psychiatry in regards to her mental health.  She continues to take her Lialda.    Her IBD/lymphocytic colitis diary is as below: BMs per day -between 1-3 Nocturnal BMs -none Blood -denies Mucous -none Tenesmus -none Urgency -none Incomplete evacuation -none Skin Manifestations -no new skin cancers Eye Manifestations - no new vision changes Joint Manifestations -no new joint pains  GI Review of  Systems Positive as above Negative for odynophagia, dysphagia, nausea, vomiting, pain, melena, hematochezia   Review of Systems General: Denies fevers/chills/unintentional weight loss HEENT: Denies new oral lesions Cardiovascular: Denies chest pain  Pulmonary: Denies shortness of breath Gastroenterological: See HPI Genitourinary: Denies darkened urine Hematological: Denies easy bruising Dermatological: As per HPI Psychological: Mood is stable Musculoskeletal: No new significant arthralgias   Medications Current Outpatient Medications  Medication Sig Dispense Refill   ARIPiprazole (ABILIFY) 5 MG tablet Take 5 mg by mouth daily.     EPINEPHrine (EPIPEN 2-PAK) 0.3 mg/0.3 mL IJ SOAJ injection Inject 0.3 mLs (0.3 mg total) into the muscle as needed for anaphylaxis. 1 each 1   fluticasone (FLONASE) 50 MCG/ACT nasal spray Place 2 sprays into both nostrils daily as needed for allergies or rhinitis. 16 g 5   hydrOXYzine (VISTARIL) 25 MG capsule Take 1-2 capsules (25-50 mg total) by mouth at bedtime as needed. For sleep 60 capsule 1   ipratropium (ATROVENT) 0.03 % nasal spray Place 1 spray into both nostrils 4 (four) times daily. Use as directed. (Patient taking differently: Place 1 spray into both nostrils 4 (four) times daily as needed. Use as directed.) 30 mL 5   levocetirizine (XYZAL) 5 MG tablet Take 1 tablet (5 mg total) by mouth every evening. (Patient taking differently: Take 5 mg by mouth at bedtime as needed.) 30 tablet 5   mesalamine (LIALDA) 1.2 g EC tablet TAKE FOUR TABLETS BY MOUTH DAILY WITH BREAKFAST 120 tablet 1   propranolol (INDERAL) 10 MG tablet Take 1 tablet (10 mg total) by mouth 3 (three) times daily as needed. For anxiety 90 tablet 1   Vilazodone  HCl 20 MG TABS Take 0.5-1 tablets (10-20 mg total) by mouth daily with breakfast. Take half tablet daily for 7 days and increase to one tablet daily after that. 30 tablet 1   No current facility-administered medications for this  visit.    Allergies Allergies  Allergen Reactions   Doxycycline Rash and Swelling    Mouth and face   Erythromycin Shortness Of Breath   Sulfa Antibiotics Shortness Of Breath   Emsam [Selegiline] Dermatitis    Allergic reaction to patch   Saxenda [Liraglutide -Weight Management] Nausea Only   Penicillins Rash    Histories Past Medical History:  Diagnosis Date   Allergy    Anxiety    Back pain    Binge eating disorder    Borderline personality disorder (HCC)    COPD (chronic obstructive pulmonary disease) (HCC)    chronic bronchitis   Depression    Edema, lower extremity    Hip pain    History of chicken pox    Joint pain    Memory loss    Pancolitis (HCC)    Poor concentration    Seasonal allergies    Skin cancer, basal cell    basal cell   Tinnitus    Vitamin D deficiency    Past Surgical History:  Procedure Laterality Date   CHOLECYSTECTOMY  2003   MOHS SURGERY  10/2017   SKIN SURGERY  05/2020   nose skin cancer   VAGINAL DELIVERY  1995   WISDOM TOOTH EXTRACTION  12/2017   Social History   Socioeconomic History   Marital status: Married    Spouse name: Not on file   Number of children: 3   Years of education: Not on file   Highest education level: Associate degree: academic program  Occupational History   Occupation: Aeronautical engineer    Comment: [employer]  Tobacco Use   Smoking status: Never   Smokeless tobacco: Never  Vaping Use   Vaping Use: Never used  Substance and Sexual Activity   Alcohol use: Not Currently    Comment: rarely   Drug use: Never   Sexual activity: Not Currently    Birth control/protection: None  Other Topics Concern   Not on file  Social History Narrative      Works for [employer], has Designer, industrial/product   3 children   Right handed    Social Determinants of Radio broadcast assistant Strain: Not on file  Food Insecurity: Not on file  Transportation Needs: Not on file  Physical Activity: Not on file   Stress: Not on file  Social Connections: Not on file  Intimate Partner Violence: Not on file   Family History  Problem Relation Age of Onset   Hypertension Mother    Diabetes Father    Colon cancer Neg Hx    Esophageal cancer Neg Hx    Rectal cancer Neg Hx    Stomach cancer Neg Hx    Pancreatic cancer Neg Hx    Inflammatory bowel disease Neg Hx    Liver disease Neg Hx    Allergic rhinitis Neg Hx    Angioedema Neg Hx    Asthma Neg Hx    Eczema Neg Hx    Urticaria Neg Hx    Immunodeficiency Neg Hx    Atopy Neg Hx    Mental illness Neg Hx    I have reviewed her medical, social, and family history in detail and updated the electronic medical record as necessary.  PHYSICAL EXAMINATION  BP (!) 140/96   Pulse 89   Ht 5' 7"  (1.702 m)   Wt 295 lb 8 oz (134 kg)   BMI 46.28 kg/m  GEN: NAD, appears stated age, doesn't appear chronically ill PSYCH: Cooperative, without pressured speech EYE: Conjunctivae pink, sclerae anicteric ENT: MMM CV: Nontachycardic RESP: No audible wheezing GI: NABS, rounded, obese, nontender, without rebound MSK/EXT: No lower extremity edema SKIN: No jaundice, skin rashes noted on bilateral upper extremities with some bullous formation NEURO:  Alert & Oriented x 3, no focal deficits   REVIEW OF DATA  I reviewed the following data at the time of this encounter:  GI Procedures and Studies  April 2023 colonoscopy - Hemorrhoids found on digital rectal exam. - The examined portion of the ileum was normal. Biopsied. - Normal mucosa in the sigmoid colon, in the descending colon, in the transverse colon, at the hepatic flexure, in the ascending colon and in the cecum. Granularity in the rectum and in the recto-sigmoid colon. Surveillance IBD biopsies were obtained as outlined above. - Non-bleeding non-thrombosed internal hemorrhoids.  Pathology Diagnosis 1. Surgical [P], small bowel, terminal ileum BENIGN ILEAL MUCOSA WITH NO DIAGNOSTIC  ABNORMALITY 2. Surgical [P], right colon MICROSCOPIC COLITIS COMPATIBLE WITH LYMPHOCYTIC COLITIS (SEE MICROSCOPIC COMMENT) 3. Surgical [P], left colon MICROSCOPIC COLITIS COMPATIBLE WITH LYMPHOCYTIC COLITIS (SEE MICROSCOPIC COMMENT) 4. Surgical [P], colon, rectum MICROSCOPIC COLITIS COMPATIBLE WITH LYMPHOCYTIC COLITIS (SEE MICROSCOPIC COMMENT)  Laboratory Studies  Reviewed in epic  Imaging Studies  No relevant studies to review   ASSESSMENT  Ms. Mastrianni is a 55 y.o. 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  The patient is seen today for evaluation and management of:  1. Lymphocytic colitis   2. IBD (inflammatory bowel disease)   3. Fecal urgency   4. Chronic diarrhea    The patient is hemodynamically and clinically stable.  What has been interesting with this patient is her transition from an IBS picture to defined IBD and now with IBS/IBD overlap and subsequent finding of now transition to lymphocytic colitis.  We were nearly ready to begin transition to a biologic but wanted to ensure we had a good sense of what was going on in her colon which led to our colonoscopy with now findings of lymphocytic colitis.  She seems to be doing better on her high-dose bismuth therapy.  We are going to hold off on biologic therapy for now.  We will have to consider or reconsider Humira/Remicade versus potential for Entyvio which was our primary mover if she has refractory lymphocytic colitis with prior colitis.  We will maintain her current mesalamine therapy.  Time will tell and we will see how she does.  All patient questions were answered to the best of my ability, and the patient agrees to the aforementioned  plan of action with follow-up as indicated.   PLAN  Continue Lialda 4.8 g daily Continue 786 mg bismuth 3 times daily through May Transition to 786 mg bismuth 2 times daily through June Follow-up at clinic and decide if we can further down titrate or maintain Holding on biologic therapy for now Will follow-up LFTs in future to consider the role of liver biopsy needs in the setting of her low positive anti-smooth muscle antibody   No  orders of the defined types were placed in this encounter.   New Prescriptions   No medications on file   Modified Medications   No medications on file    Planned Follow Up: No follow-ups on file.   Total Time in Face-to-Face and in Coordination of Care for patient including independent/personal interpretation/review of prior testing, medical history, examination, medication adjustment, communicating results with the patient directly, and documentation with the EHR is 25 minutes.   Justice Britain, MD [facility] Office # [phone]

## 2021-06-07 ENCOUNTER — Encounter: Payer: Self-pay | Admitting: Gastroenterology

## 2021-06-07 DIAGNOSIS — K52832 Lymphocytic colitis: Secondary | ICD-10-CM | POA: Insufficient documentation

## 2021-06-11 ENCOUNTER — Other Ambulatory Visit: Payer: Self-pay | Admitting: Gastroenterology

## 2021-06-18 ENCOUNTER — Encounter: Payer: Self-pay | Admitting: Psychiatry

## 2021-06-18 ENCOUNTER — Ambulatory Visit (INDEPENDENT_AMBULATORY_CARE_PROVIDER_SITE_OTHER): Payer: No Typology Code available for payment source | Admitting: Psychiatry

## 2021-06-18 VITALS — BP 179/94 | HR 87 | Temp 97.9°F | Wt 296.0 lb

## 2021-06-18 DIAGNOSIS — F332 Major depressive disorder, recurrent severe without psychotic features: Secondary | ICD-10-CM | POA: Diagnosis not present

## 2021-06-18 DIAGNOSIS — F419 Anxiety disorder, unspecified: Secondary | ICD-10-CM | POA: Diagnosis not present

## 2021-06-18 DIAGNOSIS — F5081 Binge eating disorder: Secondary | ICD-10-CM

## 2021-06-18 DIAGNOSIS — F609 Personality disorder, unspecified: Secondary | ICD-10-CM

## 2021-06-18 MED ORDER — ARIPIPRAZOLE 10 MG PO TABS: 10.0000 mg | ORAL_TABLET | Freq: Every day | ORAL | 1 refills | Status: DC

## 2021-06-18 MED ORDER — VILAZODONE HCL 20 MG PO TABS: 20.0000 mg | ORAL_TABLET | Freq: Every day | ORAL | 1 refills | Status: DC

## 2021-06-18 NOTE — Progress Notes (Signed)
Leslie Douglas OP Progress Note  06/18/2021 5:47 PM ARAH ARO  MRN:  [id number]  Chief Complaint:  Chief Complaint  Patient presents with   Follow-up: 56 year old Caucasian female, separated, employed, presented for medication management.   HPI: Leslie Douglas is a 56 year old Caucasian female, employed, separated, lives in [place], has a history of MDD, anxiety, borderline personality disorder, severe obesity, chronic colitis was evaluated in office today.  Patient today returns reporting she has been having possible side effects to the Viibryd.  She reports she feels more down and sluggish when she takes it.  She has been taking it in the morning.  She however believes that she feels so tired by the end of the day.  She has been sleeping better.  Patient continues to struggle with sadness, low motivation, low energy, concentration problems.  Patient does report thoughts of not wanting to be here-few days ago.  However denies any active plan.  Currently denies any suicidality, homicidality or perceptual disturbances.  Patient reports she has been having binge eating episodes.  This has been going on since childhood.  She reports she goes through binging episodes 3-4 times a week.  The last time it happened was a few days ago when she had several bags of chips, several large amounts of cereals as well as a sandwich which-, all in one sitting.  Patient today reports she does not have any purging episodes.  After she binges she feels extremely full, feels remorseful guilty and depressed.  She has no control over her binge eating episodes.  Patient has been struggling with significant problems with her body image, getting worse.  She did go to a dietitian however stopped going there.  Agreeable to return.  Continues to follow-up with her therapist.  Patient also calls herself a worrier, worries about things in general, currently reports she also struggles with anxiety on a regular basis.  Denies  any side effects to the Abilify, does not believe the increased appetite as a side effect of Abilify since she has had it all her life.    Visit Diagnosis:    ICD-10-CM   1. Severe episode of recurrent major depressive disorder, without psychotic features (HCC)  F33.2 ARIPiprazole (ABILIFY) 10 MG tablet    Vilazodone HCl 20 MG TABS    2. Anxiety disorder, unspecified type  F41.9 Vilazodone HCl 20 MG TABS   R/O GAD    3. Binge eating disorder  F50.81     4. Personality disorder ([place])  F60.9       Past Psychiatric History: Reviewed past psychiatric history from progress note on 05/19/2021.  I have also reviewed notes per Dr.Eksir -medical records reviewed most recent notes-June 25, 2020-past trials of medications like Lamictal, Abilify, Prozac and amitriptyline, Lexapro, selegiline patch, Pristiq.  Patient with history of DBT counseling-[facility] counseling in 2021.  She also completed [facility] therapy.  Past Medical History:  Past Medical History:  Diagnosis Date   Allergy    Anxiety    Back pain    Binge eating disorder    Borderline personality disorder (HCC)    COPD (chronic obstructive pulmonary disease) (HCC)    chronic bronchitis   Depression    Edema, lower extremity    Hip pain    History of chicken pox    Joint pain    Memory loss    Pancolitis (HCC)    Poor concentration    Seasonal allergies    Skin cancer, basal cell  basal cell   Tinnitus    Vitamin D deficiency     Past Surgical History:  Procedure Laterality Date   CHOLECYSTECTOMY  2003   MOHS SURGERY  10/2017   SKIN SURGERY  05/2020   nose skin cancer   VAGINAL DELIVERY  1995   WISDOM TOOTH EXTRACTION  12/2017    Family Psychiatric History: Reviewed family psychiatric history from progress note on 05/19/2021.  Family History:  Family History  Problem Relation Age of Onset   Hypertension Mother    Diabetes Father    Colon cancer Neg Hx    Esophageal cancer Neg Hx    Rectal cancer Neg Hx    Stomach  cancer Neg Hx    Pancreatic cancer Neg Hx    Inflammatory bowel disease Neg Hx    Liver disease Neg Hx    Allergic rhinitis Neg Hx    Angioedema Neg Hx    Asthma Neg Hx    Eczema Neg Hx    Urticaria Neg Hx    Immunodeficiency Neg Hx    Atopy Neg Hx    Mental illness Neg Hx     Social History: Reviewed social history from progress note on 05/19/2021. Social History   Socioeconomic History   Marital status: Married    Spouse name: Not on file   Number of children: 3   Years of education: Not on file   Highest education level: Associate degree: academic program  Occupational History   Occupation: Aeronautical engineer    Comment: [employer]  Tobacco Use   Smoking status: Never   Smokeless tobacco: Never  Vaping Use   Vaping Use: Never used  Substance and Sexual Activity   Alcohol use: Not Currently    Comment: rarely   Drug use: Never   Sexual activity: Not Currently    Birth control/protection: None  Other Topics Concern   Not on file  Social History Narrative      Works for [employer], has Associate Degree   3 children   Right handed    Social Determinants of Health   Financial Resource Strain: Not on file  Food Insecurity: Not on file  Transportation Needs: Not on file  Physical Activity: Not on file  Stress: Not on file  Social Connections: Not on file    Allergies:  Allergies  Allergen Reactions   Doxycycline Rash and Swelling    Mouth and face   Erythromycin Shortness Of Breath   Sulfa Antibiotics Shortness Of Breath   Emsam [Selegiline] Dermatitis    Allergic reaction to patch   Saxenda [Liraglutide -Weight Management] Nausea Only   Penicillins Rash    Metabolic Disorder Labs: Lab Results  Component Value Date   HGBA1C 5.4 05/27/2021   Lab Results  Component Value Date   PROLACTIN 3.6 (L) 05/27/2021   Lab Results  Component Value Date   CHOL 225 (H) 03/31/2021   TRIG 154.0 (H) 03/31/2021   HDL 54.10 03/31/2021   CHOLHDL 4  03/31/2021   VLDL 30.8 03/31/2021   LDLCALC 140 (H) 03/31/2021   LDLCALC 153 (H) 10/31/2019   Lab Results  Component Value Date   TSH 2.000 05/27/2021   TSH 2.32 03/27/2019    Therapeutic Level Labs: No results found for: "LITHIUM" No results found for: "VALPROATE" No results found for: "CBMZ"  Current Medications: Current Outpatient Medications  Medication Sig Dispense Refill   ARIPiprazole (ABILIFY) 10 MG tablet Take 1 tablet (10 mg total) by mouth daily. 30 tablet  1   EPINEPHrine (EPIPEN 2-PAK) 0.3 mg/0.3 mL IJ SOAJ injection Inject 0.3 mLs (0.3 mg total) into the muscle as needed for anaphylaxis. 1 each 1   fluticasone (FLONASE) 50 MCG/ACT nasal spray Place 2 sprays into both nostrils daily as needed for allergies or rhinitis. 16 g 5   hydrOXYzine (VISTARIL) 25 MG capsule Take 1-2 capsules (25-50 mg total) by mouth at bedtime as needed. For sleep 60 capsule 1   ipratropium (ATROVENT) 0.03 % nasal spray Place 1 spray into both nostrils 4 (four) times daily. Use as directed. (Patient taking differently: Place 1 spray into both nostrils 4 (four) times daily as needed. Use as directed.) 30 mL 5   levocetirizine (XYZAL) 5 MG tablet Take 1 tablet (5 mg total) by mouth every evening. (Patient taking differently: Take 5 mg by mouth at bedtime as needed.) 30 tablet 5   mesalamine (LIALDA) 1.2 g EC tablet TAKE FOUR TABLETS BY MOUTH DAILY WITH BREAKFAST 120 tablet 1   propranolol (INDERAL) 10 MG tablet Take 1 tablet (10 mg total) by mouth 3 (three) times daily as needed. For anxiety 90 tablet 1   Vilazodone HCl 20 MG TABS Take 1 tablet (20 mg total) by mouth daily with supper. 30 tablet 1   No current facility-administered medications for this visit.     Musculoskeletal: Strength & Muscle Tone: within normal limits Gait & Station: normal Patient leans: N/A  Psychiatric Specialty Exam: Review of Systems  Constitutional:  Positive for fatigue.  Psychiatric/Behavioral:  Positive for  decreased concentration, dysphoric mood and sleep disturbance. The patient is nervous/anxious.   All other systems reviewed and are negative.   Blood pressure (!) 179/94, pulse 87, temperature 97.9 F (36.6 C), temperature source Temporal, weight 296 lb (134.3 kg).Body mass index is 46.36 kg/m.  General Appearance: Casual  Eye Contact:  Fair  Speech:  Clear and Coherent  Volume:  Normal  Mood:  Anxious and Depressed  Affect:  Congruent  Thought Process:  Goal Directed and Descriptions of Associations: Intact  Orientation:  Full (Time, Place, and Person)  Thought Content: Logical   Suicidal Thoughts:  No  Homicidal Thoughts:  No  Memory:  Immediate;   Fair Recent;   Fair Remote;   Fair  Judgement:  Fair  Insight:  Fair  Psychomotor Activity:  Normal  Concentration:  Concentration: Fair and Attention Span: Fair  Recall:  [employer] of Knowledge: Fair  Language: Fair  Akathisia:  No  Handed:  Right  AIMS (if indicated): done  Assets:  Communication Skills Desire for [place] Talents/Skills Transportation  ADL's:  Intact  Cognition: WNL  Sleep:   improving   Screenings: [place] Office Visit from 05/19/2021 in [place] Total Score 0      [place] Visit from 06/18/2021 in [facility] [place] Visit from 05/19/2021 in [place] Visit from 10/31/2019 in [place] Visit from 05/15/2018 in [place] from 03/01/2018 in [facility] [place]  Total GAD-7 Score 13 16 15 12 15       PHQ2-9    [place] Visit from 06/18/2021 in [place] Office Visit from 05/19/2021 in [place] Visit from 10/31/2019 in [facility] Visit from 04/12/2019  in [place] Office Visit from 03/27/2019 in  [place]  PHQ-2 Total Score 6 6 6 6 6   PHQ-9 Total Score 22 24 17 19 15       [place] Office Visit from 06/18/2021 in [place] Office Visit from 05/19/2021 in [place] Low Risk Low Risk        Assessment and Plan: Leslie Douglas is a 56 year old Caucasian female who is employed, separated, lives in [place], has a history of depression, anxiety, chronic colitis was evaluated in office today.  Patient with continued depressive symptoms, as well as does meet criteria for binge eating disorder given her recurrent episodes of binge eating-a sense of lack of control over eating and other symptoms as noted above.  She will benefit from the following plan.  Plan MDD-unstable Increase Abilify to 10 mg p.o. daily in the morning. Viibryd 20 mg p.o. daily.  Patient advised to take the Viibryd in the evening with supper.  She will give it more time and monitor for side effects. Hydroxyzine 25 mg p.o. nightly as needed. Continue CBT/DBT with therapist-[facility].  Anxiety disorder unspecified-unstable Propranolol 10 mg p.o. 3 times daily as needed Continue CBT/DBT  Borderline personality disorder-unstable Patient to continue CBT/DBT  Binge eating disorder-unstable Patient to  continue psychotherapy sessions. Will consider Vyvanse treatment in the future however patient with elevated blood pressure reading will need management of the same prior to trial of a stimulant medication. Patient also advised to return to her previous dietitian.   High risk medication use-prolactin level-05/27/2021-3.6-low, hemoglobin A1c-5.4-within normal limits, TSH-within normal limits-discussed the same with patient.  High blood pressure reading-unstable Patient provided information to monitor her blood pressure at home.  Patient  to keep a log of the blood pressure and follow up with primary care provider.  Follow-up in clinic in 4 weeks or sooner if needed.  This note was generated in part or whole with voice recognition software. Voice recognition is usually quite accurate but there are transcription errors that can and very often do occur. I apologize for any typographical errors that were not detected and corrected.      Ursula Alert, [place] 06/18/2021, 5:47 PM

## 2021-06-18 NOTE — Patient Instructions (Signed)
Blood Pressure Record Sheet To take your blood pressure, you will need a blood pressure machine. You may be prescribed one, or you can buy a blood pressure machine (blood pressure monitor) at your clinic, drug store, or online. When choosing one, look for these features: An automatic monitor that has an arm cuff. A cuff that wraps snugly, but not too tightly, around your upper arm. You should be able to fit only one finger between your arm and the cuff. A device that stores blood pressure reading results. Do not choose a monitor that measures your blood pressure from your wrist or finger. Follow your health care provider's instructions for how to take your blood pressure. To use this form: Get one reading in the morning (a.m.) before you take any medicines. Get one reading in the evening (p.m.) before supper. Take at least two readings with each blood pressure check. This makes sure the results are correct. Wait 1-2 minutes between measurements. Write down the results in the spaces on this form. Repeat this once a week, or as told by your health care provider. Make a follow-up appointment with your health care provider to discuss the results. Blood pressure log Date: _______________________ a.m. _____________________(1st reading) _____________________(2nd reading) p.m. _____________________(1st reading) _____________________(2nd reading) Date: _______________________ a.m. _____________________(1st reading) _____________________(2nd reading) p.m. _____________________(1st reading) _____________________(2nd reading) Date: _______________________ a.m. _____________________(1st reading) _____________________(2nd reading) p.m. _____________________(1st reading) _____________________(2nd reading) Date: _______________________ a.m. _____________________(1st reading) _____________________(2nd reading) p.m. _____________________(1st reading) _____________________(2nd reading) Date:  _______________________ a.m. _____________________(1st reading) _____________________(2nd reading) p.m. _____________________(1st reading) _____________________(2nd reading) This information is not intended to replace advice given to you by your health care provider. Make sure you discuss any questions you have with your health care provider. Document Revised: 09/11/2020 Document Reviewed: 09/11/2020 Elsevier Patient Education  [place] Disorder Binge-eating disorder is a condition that involves repeated episodes of binge-eating. Binge-eating refers to eating a larger-than-normal amount of food in a short period of time, usually within 2 hours. People with this condition may eat even when they are not hungry, and they do not stop eating even when they feel full. People with binge-eating disorder feel unable to control their eating. Although they feel bad about overeating, they usually do not try to undo the bingeing by using laxatives or making themselves vomit. They do not starve themselves or exercise too much. Binge-eating disorder usually starts in the teenage years or early 81s. It often gets worse with stress. What are the causes? The cause of this condition is not known. However, it may be influenced by: Having a family history of eating disorders. Factors that are inherited from family (genetics). Experiencing trauma. Having low self-esteem or poor body image. Having other mental health issues. What increases the risk? The following factors may make you more likely to develop this condition: Being a teenager or in your early 33s. Being female. Binge-eating disorder can affect males, but it is more common in females. Being overweight or obese. Having a mental health disorder, such as depression or anxiety. Having a substance use disorder, such as alcohol use disorder. Having a history of unhealthy dieting, such as meal skipping, yo-yo dieting, food restricting,  or avoiding certain kinds of foods. What are the signs or symptoms? Symptoms of this condition include: Eating much more quickly than normal. Eating to the point of feeling physically uncomfortable. Eating large amounts of food when you are not hungry. Eating alone because you are embarrassed about how much you are eating. Feeling disgusted, depressed, or  guilty after overeating. How is this diagnosed? This condition is diagnosed through an assessment by your health care provider. You may be diagnosed with the disorder if you: Binge-eat an average of one or more times a week for three months or longer. Have three or more of the symptoms of the disorder. Once you have been diagnosed, your level of binge-eating disorder will be rated from mild to severe. The rating is based on how often you binge-eat. How is this treated? This condition may be treated with: Cognitive behavioral therapy (CBT). This is a form of talk therapy that helps you recognize the thoughts, beliefs, and emotions that contribute to overeating. It also helps you change them. Interpersonal psychotherapy. This is a form of talk therapy that focuses on fixing relationship problems that trigger binge-eating episodes. Dialectical behavioral therapy (DBT). This is a form of talk therapy that helps you learn skills to control your emotions and tolerate distress without binge-eating. Medicine. Treatment is usually provided by mental health professionals, such as psychologists, psychiatrists, licensed professional counselors, and clinical social workers. Follow these instructions at home: Lifestyle     Work to develop a healthy relationship with food. Talk with your health care provider or a nutrition specialist (dietitian). He or she can provide guidance about healthy eating and healthy lifestyle choices. Eat a healthy diet that consists of lean meats and low-fat dairy products, as well as foods that are high in fiber, such as  fresh fruits and vegetables, whole grains, and beans. Start an exercise routine and stay active. Aim for 30 minutes of exercise a day on 5 or more days a week to keep your body strong and healthy. You may need to exercise more if you want to lose weight. Talk with your health care provider about how much and what type of exercise you can do. Some ways to be active include: Playing sports. Biking. Skating or skateboarding. Dancing. Running, walking, jogging, or hiking. Doing yard work. General instructions Take over-the-counter and prescription medicines only as told by your health care provider. Drink enough fluid to keep your urine pale yellow. Keep all follow-up visits. This is important. Where to find more information National Eating Disorders Association (NEDA): www.nationaleatingdisorders.org NEDA Helpline: 1-(320) 836-5715 Contact a health care provider if: Your symptoms get worse. You start having new symptoms. You start compensating for eating binges with harmful behaviors, such as: Making yourself vomit. Exercising too much. Using laxatives. Get help right away if: You have serious thoughts about hurting yourself or someone else. If you ever feel like you may hurt yourself or others, or have thoughts about taking your own life, get help right away. You can go to your nearest emergency department or: Call your local emergency services (911 in the [place] Call a suicide crisis helpline, such as the [place] at (651)439-2717 or 988 in the [place]. This is open 24 hours a day in the [place] Text the Crisis Text Line at 7088354560 (in the [place].). Summary You may have binge-eating disorder if you have feelings of guilt from overeating, eat to the point of feeling uncomfortable, eat a large amount of food in a short time, or find yourself eating when you are not hungry. Seek help from your health care provider. The exact cause of a binge-eating disorder is not known.  There are some risk factors for this disease, such as having a mental health disorder and having a history of unhealthy dieting. There are a variety of treatment options such as  counseling therapy and medicines. These can help you overcome your binge-eating disorder. This information is not intended to replace advice given to you by your health care provider. Make sure you discuss any questions you have with your health care provider. Document Revised: 07/23/2020 Document Reviewed: 05/28/2020 Elsevier Patient Education  [place].

## 2021-07-13 ENCOUNTER — Ambulatory Visit: Payer: No Typology Code available for payment source | Admitting: Physician Assistant

## 2021-07-13 VITALS — BP 142/94 | HR 90 | Temp 98.4°F | Resp 18 | Ht 67.0 in | Wt 296.0 lb

## 2021-07-13 DIAGNOSIS — R030 Elevated blood-pressure reading, without diagnosis of hypertension: Secondary | ICD-10-CM | POA: Diagnosis not present

## 2021-07-13 DIAGNOSIS — F5081 Binge eating disorder: Secondary | ICD-10-CM | POA: Diagnosis not present

## 2021-07-13 MED ORDER — LOSARTAN POTASSIUM 25 MG PO TABS: 25.0000 mg | ORAL_TABLET | Freq: Every day | ORAL | 0 refills | Status: DC

## 2021-07-13 NOTE — Patient Instructions (Signed)
It was great to see you!  Start losartan 25 mg daily.  Keep checking blood pressure -- see handout  Let's follow-up in 1 month, sooner if you have concerns.  Take care,  Inda Coke PA-C

## 2021-07-13 NOTE — Progress Notes (Signed)
Leslie Douglas is a 56 y.o. female here for a follow up of a pre-existing problem.  History of Present Illness:   Chief Complaint  Patient presents with   Hypertension    Patient states she is here to discuss BP being high for a few months.    HPI  Elevated BP Readings  Patient complain of elevated blood pressure readings for the past few months. Her blood pressure was elevated in the office today as well. She has been checking her blood pressure at home twice daily. States she was recently at her psychiatry office and had blood pressure of 035 systolic. She is currently not taking any medication. Does take Propranolol 10 mg as needed for anxiety, up to TID. Patient denies chest pain, SOB, blurred vision, dizziness, unusual headaches. Denies excessive caffeine intake, stimulant usage, excessive alcohol intake, or increase in salt consumption.  BP Readings from Last 3 Encounters:  07/13/21 (!) 142/94  06/03/21 (!) 140/96  04/29/21 127/83    Home BP Log  125/111-07/10/2021 (am) 137/98 -07/10/2021(pm) 130/115- 07/11/2021(am) 143/109-07/12/2021(pm) 184/107-07/13/2021 (am)  BED Offiically dx with BED by her psychiatrist. They are wanting to possibly start Vyvanse for this however they need her BP under better control. She continues in therapy and psychiatry at this time.  Past Medical History:  Diagnosis Date   Allergy    Anxiety    Back pain    Binge eating disorder    Borderline personality disorder (HCC)    COPD (chronic obstructive pulmonary disease) (HCC)    chronic bronchitis   Depression    Edema, lower extremity    Hip pain    History of chicken pox    Joint pain    Memory loss    Pancolitis (HCC)    Poor concentration    Seasonal allergies    Skin cancer, basal cell    basal cell   Tinnitus    Vitamin D deficiency      Social History   Tobacco Use   Smoking status: Never   Smokeless tobacco: Never  Vaping Use   Vaping Use: Never used  Substance Use  Topics   Alcohol use: Not Currently    Comment: rarely   Drug use: Never    Past Surgical History:  Procedure Laterality Date   CHOLECYSTECTOMY  2003   MOHS SURGERY  10/2017   SKIN SURGERY  05/2020   nose skin cancer   VAGINAL DELIVERY  1995   WISDOM TOOTH EXTRACTION  12/2017    Family History  Problem Relation Age of Onset   Hypertension Mother    Diabetes Father    Colon cancer Neg Hx    Esophageal cancer Neg Hx    Rectal cancer Neg Hx    Stomach cancer Neg Hx    Pancreatic cancer Neg Hx    Inflammatory bowel disease Neg Hx    Liver disease Neg Hx    Allergic rhinitis Neg Hx    Angioedema Neg Hx    Asthma Neg Hx    Eczema Neg Hx    Urticaria Neg Hx    Immunodeficiency Neg Hx    Atopy Neg Hx    Mental illness Neg Hx     Allergies  Allergen Reactions   Doxycycline Rash and Swelling    Mouth and face   Erythromycin Shortness Of Breath   Sulfa Antibiotics Shortness Of Breath   Emsam [Selegiline] Dermatitis    Allergic reaction to patch   Saxenda [Liraglutide -Weight Management]  Nausea Only   Penicillins Rash    Current Medications:   Current Outpatient Medications:    ARIPiprazole (ABILIFY) 10 MG tablet, Take 1 tablet (10 mg total) by mouth daily., Disp: 30 tablet, Rfl: 1   EPINEPHrine (EPIPEN 2-PAK) 0.3 mg/0.3 mL IJ SOAJ injection, Inject 0.3 mLs (0.3 mg total) into the muscle as needed for anaphylaxis., Disp: 1 each, Rfl: 1   fluticasone (FLONASE) 50 MCG/ACT nasal spray, Place 2 sprays into both nostrils daily as needed for allergies or rhinitis., Disp: 16 g, Rfl: 5   hydrOXYzine (VISTARIL) 25 MG capsule, Take 1-2 capsules (25-50 mg total) by mouth at bedtime as needed. For sleep, Disp: 60 capsule, Rfl: 1   ipratropium (ATROVENT) 0.03 % nasal spray, Place 1 spray into both nostrils 4 (four) times daily. Use as directed. (Patient taking differently: Place 1 spray into both nostrils 4 (four) times daily as needed. Use as directed.), Disp: 30 mL, Rfl: 5    levocetirizine (XYZAL) 5 MG tablet, Take 1 tablet (5 mg total) by mouth every evening. (Patient taking differently: Take 5 mg by mouth at bedtime as needed.), Disp: 30 tablet, Rfl: 5   mesalamine (LIALDA) 1.2 g EC tablet, TAKE FOUR TABLETS BY MOUTH DAILY WITH BREAKFAST, Disp: 120 tablet, Rfl: 1   propranolol (INDERAL) 10 MG tablet, Take 1 tablet (10 mg total) by mouth 3 (three) times daily as needed. For anxiety, Disp: 90 tablet, Rfl: 1   Vilazodone HCl 20 MG TABS, Take 1 tablet (20 mg total) by mouth daily with supper., Disp: 30 tablet, Rfl: 1   Review of Systems:   ROS Negative unless otherwise specified per HPI.   Vitals:   Vitals:   07/13/21 1503  BP: (!) 142/94  Pulse: 90  Resp: 18  Temp: 98.4 F (36.9 C)  TempSrc: Temporal  SpO2: 97%  Weight: 296 lb (134.3 kg)  Height: 5' 7"  (1.702 m)     Body mass index is 46.36 kg/m.  Physical Exam:   Physical Exam Vitals and nursing note reviewed.  Constitutional:      General: She is not in acute distress.    Appearance: She is well-developed. She is not ill-appearing or toxic-appearing.  Cardiovascular:     Rate and Rhythm: Normal rate and regular rhythm.     Pulses: Normal pulses.     Heart sounds: Normal heart sounds, S1 normal and S2 normal.  Pulmonary:     Effort: Pulmonary effort is normal.     Breath sounds: Normal breath sounds.  Skin:    General: Skin is warm and dry.  Neurological:     Mental Status: She is alert.     GCS: GCS eye subscore is 4. GCS verbal subscore is 5. GCS motor subscore is 6.  Psychiatric:        Speech: Speech normal.        Behavior: Behavior normal. Behavior is cooperative.     Assessment and Plan:   Elevated blood pressure reading Above goal No evidence of end-organ damage Start losartan 25 mg daily Continue to monitor daily BP Follow-up in 1 month, sooner if concerns  Binge Eating Disorder Uncontrolled Recommend continued close follow-up with psychiatry and talk therapy Will  continue to try to work towards normal blood pressure to help improve tolerance of stimulant, if added by psychiatry  Leslie Douglas as a scribe for Inda Coke, PA.,have documented all relevant documentation on the behalf of Inda Coke, PA,as directed by  Inda Coke, PA while in  the presence of Inda Coke, [place].   I, Inda Coke, [place], have reviewed all documentation for this visit. The documentation on 07/13/21 for the exam, diagnosis, procedures, and orders are all accurate and complete.   Inda Coke, PA-C

## 2021-07-17 ENCOUNTER — Ambulatory Visit (INDEPENDENT_AMBULATORY_CARE_PROVIDER_SITE_OTHER): Payer: No Typology Code available for payment source | Admitting: Psychiatry

## 2021-07-17 ENCOUNTER — Encounter: Payer: Self-pay | Admitting: Psychiatry

## 2021-07-17 VITALS — BP 130/90 | HR 92 | Ht 67.0 in | Wt 295.0 lb

## 2021-07-17 DIAGNOSIS — F332 Major depressive disorder, recurrent severe without psychotic features: Secondary | ICD-10-CM

## 2021-07-17 DIAGNOSIS — F419 Anxiety disorder, unspecified: Secondary | ICD-10-CM

## 2021-07-17 DIAGNOSIS — F603 Borderline personality disorder: Secondary | ICD-10-CM

## 2021-07-17 DIAGNOSIS — F5081 Binge eating disorder: Secondary | ICD-10-CM

## 2021-07-17 MED ORDER — VILAZODONE HCL 20 MG PO TABS: 30.0000 mg | ORAL_TABLET | Freq: Every day | ORAL | 1 refills | Status: DC

## 2021-07-17 NOTE — Progress Notes (Signed)
Leslie Douglas  07/17/2021 2:08 PM Leslie Douglas  MRN:  [id number]  Chief Complaint:  Chief Complaint  Patient presents with   Follow-up: 56 year old Caucasian female, separated, employed, has a history of depression, anxiety, binge eating, presented for medication management.   HPI: Leslie Douglas is a 56 year old Caucasian female, employed, separated, lives in [place], has a history of MDD, anxiety, borderline personality disorder, severe obesity, chronic colitis was evaluated in office today.  Patient today reports although improving she continues to have episodes of sadness, low motivation, low energy.  She also continues to have anxiety although she is trying to work with the therapist on the same.  Has established care with a therapist and follows up on a regular basis.  Has been working with her therapist on mindful eating.  This has tremendously helped her with her binge eating episodes.  She has been making sure she takes her time to eat her meals slowly, tasting them and giving it time.  She also has been mindful about not eating or snacking while watching TV.  Although she continues to have binge eating she has been able to control it better than before.  Currently compliant on the Abilify higher dosage which has helped.  Denies side effects.  Continues to be compliant on the Viibryd.  Denies side effects.  Denies any suicidality, homicidality or perceptual disturbances.  Recently diagnosed with high blood pressure, per family medicine provided.  Was started on losartan, 25 mg, has been taking it since the past 2 days.  Reviewed notes per Ms.Worley -dated 07/13/2021.  Losartan 25 mg started, continue to monitor daily PT and follow-up in 1 month."  Patient denies any other concerns today.      Visit Diagnosis:    ICD-10-CM   1. Severe episode of recurrent major depressive disorder, without psychotic features (HCC)  F33.2 Vilazodone HCl 20 MG TABS    2. Anxiety  disorder, unspecified type  F41.9 Vilazodone HCl 20 MG TABS   R/O GAD    3. Binge eating disorder  F50.81     4. Borderline personality disorder ([place])  F60.3       Past Psychiatric History: Reviewed past psychiatric history from progress Douglas on 05/19/2021.  I have reviewed notes per Dr.Eksir.  Previous provider.  Past trials of medications like Lamictal, Abilify, Prozac, amitriptyline, Lexapro, selegiline patch, Pristiq.  History of DBT counseling-[facility] in 2021.  Completed [facility] therapy.  Past Medical History:  Past Medical History:  Diagnosis Date   Allergy    Anxiety    Back pain    Binge eating disorder    Borderline personality disorder ([place])    COPD (chronic obstructive pulmonary disease) (HCC)    chronic bronchitis   Depression    Edema, lower extremity    Hip pain    History of chicken pox    Joint pain    Memory loss    Pancolitis (HCC)    Poor concentration    Seasonal allergies    Skin cancer, basal cell    basal cell   Tinnitus    Vitamin D deficiency     Past Surgical History:  Procedure Laterality Date   CHOLECYSTECTOMY  2003   MOHS SURGERY  10/2017   SKIN SURGERY  05/2020   nose skin cancer   [place] EXTRACTION  12/2017    Family Psychiatric History: Reviewed family psychiatric history from progress Douglas on 05/19/2021.  Family History:  Family History  Problem Relation Age of Onset   Hypertension Mother    Diabetes Father    Colon cancer Neg Hx    Esophageal cancer Neg Hx    Rectal cancer Neg Hx    Stomach cancer Neg Hx    Pancreatic cancer Neg Hx    Inflammatory bowel disease Neg Hx    Liver disease Neg Hx    Allergic rhinitis Neg Hx    Angioedema Neg Hx    Asthma Neg Hx    Eczema Neg Hx    Urticaria Neg Hx    Immunodeficiency Neg Hx    Atopy Neg Hx    Mental illness Neg Hx     Social History: Reviewed social history from progress Douglas on 05/19/2021. Social History   Socioeconomic History    Marital status: Married    Spouse name: Not on file   Number of children: 3   Years of education: Not on file   Highest education level: Associate degree: academic program  Occupational History   Occupation: Aeronautical engineer    Comment: [employer]  Tobacco Use   Smoking status: Never   Smokeless tobacco: Never  Vaping Use   Vaping Use: Never used  Substance and Sexual Activity   Alcohol use: Not Currently    Comment: rarely   Drug use: Never   Sexual activity: Not Currently    Birth control/protection: None  Other Topics Concern   Not on file  Social History Narrative      Works for [employer], has Associate Degree   3 children   Right handed    Social Determinants of Health   Financial Resource Strain: Not on file  Food Insecurity: Not on file  Transportation Needs: Not on file  Physical Activity: Not on file  Stress: Not on file  Social Connections: Not on file    Allergies:  Allergies  Allergen Reactions   Doxycycline Rash and Swelling    Mouth and face   Erythromycin Shortness Of Breath   Sulfa Antibiotics Shortness Of Breath   Emsam [Selegiline] Dermatitis    Allergic reaction to patch   Saxenda [Liraglutide -Weight Management] Nausea Only   Penicillins Rash    Metabolic Disorder Labs: Lab Results  Component Value Date   HGBA1C 5.4 05/27/2021   Lab Results  Component Value Date   PROLACTIN 3.6 (L) 05/27/2021   Lab Results  Component Value Date   CHOL 225 (H) 03/31/2021   TRIG 154.0 (H) 03/31/2021   HDL 54.10 03/31/2021   CHOLHDL 4 03/31/2021   VLDL 30.8 03/31/2021   LDLCALC 140 (H) 03/31/2021   LDLCALC 153 (H) 10/31/2019   Lab Results  Component Value Date   TSH 2.000 05/27/2021   TSH 2.32 03/27/2019    Therapeutic Level Labs: No results found for: "LITHIUM" No results found for: "VALPROATE" No results found for: "CBMZ"  Current Medications: Current Outpatient Medications  Medication Sig Dispense Refill   ARIPiprazole  (ABILIFY) 10 MG tablet Take 1 tablet (10 mg total) by mouth daily. 30 tablet 1   EPINEPHrine (EPIPEN 2-PAK) 0.3 mg/0.3 mL IJ SOAJ injection Inject 0.3 mLs (0.3 mg total) into the muscle as needed for anaphylaxis. 1 each 1   fluticasone (FLONASE) 50 MCG/ACT nasal spray Place 2 sprays into both nostrils daily as needed for allergies or rhinitis. 16 g 5   hydrOXYzine (VISTARIL) 25 MG capsule Take 1-2 capsules (25-50 mg total) by mouth at bedtime as needed. For sleep  60 capsule 1   ipratropium (ATROVENT) 0.03 % nasal spray Place 1 spray into both nostrils 4 (four) times daily. Use as directed. (Patient taking differently: Place 1 spray into both nostrils 4 (four) times daily as needed. Use as directed.) 30 mL 5   levocetirizine (XYZAL) 5 MG tablet Take 1 tablet (5 mg total) by mouth every evening. (Patient taking differently: Take 5 mg by mouth at bedtime as needed.) 30 tablet 5   losartan (COZAAR) 25 MG tablet Take 1 tablet (25 mg total) by mouth daily. 90 tablet 0   mesalamine (LIALDA) 1.2 g EC tablet TAKE FOUR TABLETS BY MOUTH DAILY WITH BREAKFAST 120 tablet 1   propranolol (INDERAL) 10 MG tablet Take 1 tablet (10 mg total) by mouth 3 (three) times daily as needed. For anxiety 90 tablet 1   Vilazodone HCl 20 MG TABS Take 1.5 tablets (30 mg total) by mouth daily with supper. 45 tablet 1   No current facility-administered medications for this visit.     Musculoskeletal: Strength & Muscle Tone: within normal limits Gait & Station: normal Patient leans: N/A  Psychiatric Specialty Exam: Review of Systems  Psychiatric/Behavioral:  Positive for dysphoric mood. The patient is nervous/anxious.   All other systems reviewed and are negative.   Blood pressure 130/90, pulse 92, height 5' 7"  (1.702 m), weight 295 lb (133.8 kg), SpO2 96 %.Body mass index is 46.2 kg/m.  General Appearance: Casual  Eye Contact:  Fair  Speech:  Clear and Coherent  Volume:  Normal  Mood:  Anxious and Depressed  Affect:   Congruent  Thought Process:  Goal Directed and Descriptions of Associations: Intact  Orientation:  Full (Time, Place, and Person)  Thought Content: Logical   Suicidal Thoughts:  No  Homicidal Thoughts:  No  Memory:  Immediate;   Fair Recent;   Fair Remote;   Fair  Judgement:  Fair  Insight:  Fair  Psychomotor Activity:  Normal  Concentration:  Concentration: Fair and Attention Span: Fair  Recall:  [employer] of Knowledge: Fair  Language: Fair  Akathisia:  No  Handed:  Right  AIMS (if indicated): done  Assets:  Communication Skills Desire for Improvement Housing Social Support  ADL's:  Intact  Cognition: WNL  Sleep:  Fair   Screenings: [facility] Office Visit from 07/17/2021 in [facility] Office Visit from 05/19/2021 in [facility] Total Score 0 0      GAD-7    [place] Visit from 06/18/2021 in [facility] Visit from 05/19/2021 in [facility] Visit from 10/31/2019 in [facility] Visit from 05/15/2018 in [facility] from 03/01/2018 in [facility]  Total GAD-7 Score 13 16 15 12 15       [place]    [place] Visit from 07/17/2021 in [facility] Visit from 06/18/2021 in [facility] Office Visit from 05/19/2021 in [facility] Visit from 10/31/2019 in [facility] Visit from 04/12/2019 in [facility]  PHQ-2 Total Score 6 6 6 6 6   PHQ-9 Total Score 15 22 24 17 19       [place] Visit from 07/17/2021 in [facility] Visit from 06/18/2021 in [facility] Office Visit from 05/19/2021 in [facility]  CATEGORY No Risk Low Risk Low Risk        Assessment and Plan: NEENAH CANTER is a 56 year old Caucasian female, employed, separated, lives in [place], has a history of depression, anxiety, binge eating disorder hypertension, chronic colitis was evaluated in office today.  Patient with improvement of her mood however continues to need medication readjustment, will benefit from the following plan.  Plan MDD-unstable Continue Abilify 10 mg p.o. daily in the morning Increase Viibryd to 30 mg p.o. daily. Hydroxyzine 25 mg p.o. nightly as needed Continue CBT/DBT with therapist at [facility].  Anxiety disorder unspecified-improving Propranolol 10 mg p.o. 3 times daily. Continue CBT/DBT  Borderline personality disorder-unstable Continue CBT/DBT  Binge eating disorder-unstable Continue psychotherapy. We will consider starting Vyvanse as needed. Patient to monitor her blood pressure.  Reviewed notes per Ms.Worley dated 07/13/2021-as noted above.  Follow-up in clinic in 6 to 8 weeks or sooner if needed.   This Douglas was generated in part or whole with voice recognition software. Voice recognition is usually quite accurate but there are transcription errors that can and very often do occur. I apologize for any typographical errors that were not detected and corrected.     Ursula Alert, MD 07/17/2021, 2:08 PM

## 2021-07-22 ENCOUNTER — Encounter: Payer: Self-pay | Admitting: Gastroenterology

## 2021-07-22 ENCOUNTER — Other Ambulatory Visit (INDEPENDENT_AMBULATORY_CARE_PROVIDER_SITE_OTHER): Payer: No Typology Code available for payment source

## 2021-07-22 ENCOUNTER — Ambulatory Visit: Payer: No Typology Code available for payment source | Admitting: Gastroenterology

## 2021-07-22 VITALS — BP 110/80 | HR 86 | Ht 67.0 in | Wt 295.0 lb

## 2021-07-22 DIAGNOSIS — R7989 Other specified abnormal findings of blood chemistry: Secondary | ICD-10-CM

## 2021-07-22 DIAGNOSIS — K529 Noninfective gastroenteritis and colitis, unspecified: Secondary | ICD-10-CM

## 2021-07-22 DIAGNOSIS — K5100 Ulcerative (chronic) pancolitis without complications: Secondary | ICD-10-CM

## 2021-07-22 DIAGNOSIS — K52832 Lymphocytic colitis: Secondary | ICD-10-CM

## 2021-07-22 LAB — PROTIME-INR
INR: 1 ratio (ref 0.8–1.0)
Prothrombin Time: 10.7 s (ref 9.6–13.1)

## 2021-07-22 MED ORDER — MESALAMINE 1.2 G PO TBEC
DELAYED_RELEASE_TABLET | ORAL | 11 refills | Status: DC
Start: 2021-07-22 — End: 2021-12-30

## 2021-07-22 NOTE — Patient Instructions (Signed)
Your provider has requested that you go to the basement level for lab work before leaving today. Press "B" on the elevator. The lab is located at the first door on the left as you exit the elevator.  We have sent the following medications to your pharmacy for you to pick up at your convenience: mesalamine.  It's ok for you to take pepto bismol twice daily. If you decide to decrease regimen, please contact our offce through mychart.   Please follow up with Dr. Rush Landmark in 4 months.   The [facility] providers would like to encourage you to use [facility] to communicate with providers for non-urgent requests or questions.  Due to long hold times on the telephone, sending your provider a message by [facility] may be a faster and more efficient way to get a response.  Please allow 48 business hours for a response.  Please remember that this is for non-urgent requests.   Due to recent changes in healthcare laws, you may see the results of your imaging and laboratory studies on MyChart before your provider has had a chance to review them.  We understand that in some cases there may be results that are confusing or concerning to you. Not all laboratory results come back in the same time frame and the provider may be waiting for multiple results in order to interpret others.  Please give [name] 48 hours in order for your provider to thoroughly review all the results before contacting the office for clarification of your results.

## 2021-07-22 NOTE — Progress Notes (Signed)
[facility] VISIT   Primary Care Provider [place], [place], [place] [address] [place] [address] [phone]  Patient Profile: MARIO VOONG is a 56 y.o. 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  The patient presents to the [facility] for an evaluation and management of problem(s) noted below:  Problem List 1. Lymphocytic colitis   2. Ulcerative chronic pancolitis without complications ([place])   3. Chronic diarrhea   4. Abnormal LFTs     History of Present Illness: Please see prior notes for full details of HPI.  Interval History The patient returns for follow-up today.  She has been doing very well with her bismuth dosing for her lymphocytic colitis.  She is doing to pills twice daily (due to her schedule she is not always able to get her lunchtime dose of bismuth and).  By doing this regimen, she is able to have between 2 and 3 bowel movements per day on her own terms.  When she was taking higher dose bismuth she did have an episode of bright red blood per rectum and visual pain because she was feeling that the bowels had actually become too hard.  She is comfortable with her current regimen.  She is working with her psychiatrist and her primary care provider in regards to blood pressure management and has recently been initiated on losartan.  She has not noticed any changes in her bowel habits since this was initiated.  She continues to take her Lialda.   Her IBD/lymphocytic colitis diary is as below: BMs per day -between 2-3 Nocturnal BMs -none Blood -denies (except that one episode when her stool was harder) Mucous -none Tenesmus  -none Urgency -none Incomplete evacuation -none Skin Manifestations -no new skin cancers Eye Manifestations - no new vision changes Joint Manifestations -no new joint pains  GI Review of Systems Positive as above Negative for dysphagia, nausea, vomiting, abdominal pain  Review of Systems General: Denies fevers/chills/unintentional weight loss HEENT: Denies new oral lesions Cardiovascular: Denies chest pain  Pulmonary: Denies shortness of breath Gastroenterological: See HPI Genitourinary: Denies darkened urine Hematological: Denies easy bruising Dermatological: As per HPI Psychological: Mood is stable Musculoskeletal: No new significant arthralgias   Medications Current Outpatient Medications  Medication Sig Dispense Refill   ARIPiprazole (ABILIFY) 10 MG tablet Take 1 tablet (10 mg total) by mouth daily. 30 tablet 1   EPINEPHrine (EPIPEN 2-PAK) 0.3 mg/0.3 mL IJ SOAJ injection Inject 0.3 mLs (0.3 mg total) into the muscle as needed for anaphylaxis. 1 each 1   fluticasone (FLONASE) 50 MCG/ACT nasal spray Place 2 sprays into both nostrils daily as needed for allergies or rhinitis. 16 g 5   hydrOXYzine (VISTARIL) 25 MG capsule Take 1-2 capsules (25-50 mg total) by mouth at bedtime as needed. For sleep 60 capsule 1   ipratropium (ATROVENT) 0.03 % nasal spray Place 1 spray into both nostrils 4 (four) times daily. Use as directed. (Patient taking differently: Place 1 spray into both nostrils 4 (four) times daily as needed. Use as directed.) 30 mL 5   levocetirizine (XYZAL) 5 MG tablet Take 1 tablet (5 mg total) by mouth every evening. (Patient taking differently: Take 5 mg by mouth at bedtime as needed.) 30 tablet 5   losartan (COZAAR) 25 MG  tablet Take 1 tablet (25 mg total) by mouth daily. 90 tablet 0   propranolol (INDERAL) 10 MG tablet Take 1 tablet (10 mg total) by mouth 3 (three) times daily as needed. For anxiety 90 tablet 1   Vilazodone HCl 20 MG TABS Take 1.5 tablets (30 mg total)  by mouth daily with supper. 45 tablet 1   mesalamine (LIALDA) 1.2 g EC tablet TAKE FOUR TABLETS BY MOUTH DAILY WITH BREAKFAST 120 tablet 11   No current facility-administered medications for this visit.    Allergies Allergies  Allergen Reactions   Doxycycline Rash and Swelling    Mouth and face   Erythromycin Shortness Of Breath   Sulfa Antibiotics Shortness Of Breath   Emsam [Selegiline] Dermatitis    Allergic reaction to patch   Saxenda [Liraglutide -Weight Management] Nausea Only   Penicillins Rash    Histories Past Medical History:  Diagnosis Date   Allergy    Anxiety    Back pain    Binge eating disorder    Borderline personality disorder (HCC)    COPD (chronic obstructive pulmonary disease) (HCC)    chronic bronchitis   Depression    Edema, lower extremity    Hip pain    History of chicken pox    Joint pain    Memory loss    Pancolitis (HCC)    Poor concentration    Seasonal allergies    Skin cancer, basal cell    basal cell   Tinnitus    Vitamin D deficiency    Past Surgical History:  Procedure Laterality Date   CHOLECYSTECTOMY  2003   MOHS SURGERY  10/2017   SKIN SURGERY  05/2020   nose skin cancer   VAGINAL DELIVERY  1995   WISDOM TOOTH EXTRACTION  12/2017   Social History   Socioeconomic History   Marital status: Married    Spouse name: Not on file   Number of children: 3   Years of education: Not on file   Highest education level: Associate degree: academic program  Occupational History   Occupation: Aeronautical engineer    Comment: [employer]  Tobacco Use   Smoking status: Never   Smokeless tobacco: Never  Vaping Use   Vaping Use: Never used  Substance and Sexual Activity   Alcohol use: Not Currently    Comment: rarely   Drug use: Never   Sexual activity: Not Currently    Birth control/protection: None  Other Topics Concern   Not on file  Social History Narrative      Works for [employer], has Designer, industrial/product   3 children    Right handed    Social Determinants of Radio broadcast assistant Strain: Not on file  Food Insecurity: Not on file  Transportation Needs: Not on file  Physical Activity: Not on file  Stress: Not on file  Social Connections: Not on file  Intimate Partner Violence: Not on file   Family History  Problem Relation Age of Onset   Hypertension Mother    Diabetes Father    Colon cancer Neg Hx    Esophageal cancer Neg Hx    Rectal cancer Neg Hx    Stomach cancer Neg Hx    Pancreatic cancer Neg Hx    Inflammatory bowel disease Neg Hx    Liver disease Neg Hx    Allergic rhinitis Neg Hx    Angioedema Neg Hx    Asthma Neg Hx    Eczema Neg Hx  Urticaria Neg Hx    Immunodeficiency Neg Hx    Atopy Neg Hx    Mental illness Neg Hx    I have reviewed her medical, social, and family history in detail and updated the electronic medical record as necessary.    PHYSICAL EXAMINATION  BP 110/80   Pulse 86   Ht 5' 7"  (1.702 m)   Wt 295 lb (133.8 kg)   BMI 46.20 kg/m  GEN: NAD, appears stated age, doesn't appear chronically ill PSYCH: Cooperative, without pressured speech EYE: Conjunctivae pink, sclerae anicteric ENT: MMM CV: Nontachycardic RESP: No audible wheezing GI: NABS, rounded, obese, nontender, without rebound MSK/EXT: No lower extremity edema SKIN: No jaundice, skin rashes noted on bilateral upper extremities with some bullous formation NEURO:  Alert & Oriented x 3, no focal deficits   REVIEW OF DATA  I reviewed the following data at the time of this encounter:  GI Procedures and Studies  No new studies to review  Laboratory Studies  Reviewed in epic  Imaging Studies  No relevant studies to review   ASSESSMENT  Ms. Gatchel is a 56 y.o. 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  The patient is seen today for evaluation and management of:  1. Lymphocytic colitis   2. Ulcerative chronic pancolitis without complications ([place])   3. Chronic diarrhea   4. Abnormal LFTs     The patient is clinically and hemodynamically stable.  She is doing well at this time.  We will plan to maintain her on bismuth therapy for now since she has done so well.  We may even try to down titrate her in the course of the coming months.  However, she was recently initiated on losartan.  With this being in place, I do not want to down titrate her yet because there are rare cases of losartan induced lymphocytic colitis occurring.  Unless the patient were to show a refractory course with increasing bismuth therapy and repeat endoscopic evaluation that suggested evidence of persisting lymphocytic colitis or recurrence of her chronic colitis I would hold off on biologic therapy (we had considered Entyvio over Humira/Remicade due to her history of skin cancer).  She will maintain her mesalamine therapy for now.  Hopefully she continues to do well.  We will see her back in a few months.  We will plan a 1 year follow-up colonoscopy from her last colonoscopy to see where things stand.  Due to the patient's history of a positive anti-smooth muscle antibody as well as a slightly elevated ALT, although my pretest probability of this patient having autoimmune hepatitis is low, I would like to repeat things and see where things stand.  If she continues to have an elevation in her transferase is and she has a positive anti-smooth muscle antibody still, we may need to consider a liver biopsy at some point.  If her anti-smooth muscle antibody is no longer positive then we will likely say that her liver biochemical testing is off as a result of metabolic associated steatotic disease.  All patient questions were  answered to the best of my ability, and the patient agrees to the aforementioned plan of action with follow-up as indicated.   PLAN  Continue Lialda 4.8 g daily Continue 524 mg bismuth 2 times  daily for now -Follow-up at clinic and decide if we can further down titrate or maintain -Holding on biologic therapy for now -Keep in mind losartan use in regards to potentially inciting or causing a microscopic colitis as noted in the literature Smooth muscle antibody and CMP and INR to be drawn Follow-up to be dictated based on results of labs and for now holding on liver biopsy   Orders Placed This Encounter  Procedures   Comprehensive metabolic panel   Protime-INR   Anti-smooth muscle antibody, IgG    New Prescriptions   No medications on file   Modified Medications   Modified Medication Previous Medication   MESALAMINE (LIALDA) 1.2 G EC TABLET mesalamine (LIALDA) 1.2 g EC tablet      TAKE FOUR TABLETS BY MOUTH DAILY WITH BREAKFAST    TAKE FOUR TABLETS BY MOUTH DAILY WITH BREAKFAST    Planned Follow Up: Return in about 4 months (around 11/22/2021).   Total Time in Face-to-Face and in Coordination of Care for patient including independent/personal interpretation/review of prior testing, medical history, examination, medication adjustment, communicating results with the patient directly, and documentation with the EHR is 25 minutes.   Justice Britain, MD [facility] Office # [phone]

## 2021-07-23 ENCOUNTER — Ambulatory Visit: Payer: No Typology Code available for payment source | Admitting: Gastroenterology

## 2021-07-24 LAB — ANTI-SMOOTH MUSCLE ANTIBODY, IGG: Actin (Smooth Muscle) Antibody (IGG): <20 U (ref <20)

## 2021-08-10 ENCOUNTER — Encounter: Payer: Self-pay | Admitting: Physician Assistant

## 2021-08-10 ENCOUNTER — Other Ambulatory Visit: Payer: Self-pay | Admitting: Psychiatry

## 2021-08-10 ENCOUNTER — Ambulatory Visit: Payer: No Typology Code available for payment source | Admitting: Physician Assistant

## 2021-08-10 VITALS — BP 130/90 | HR 78 | Temp 97.4°F | Ht 67.0 in | Wt 297.2 lb

## 2021-08-10 DIAGNOSIS — I10 Essential (primary) hypertension: Secondary | ICD-10-CM | POA: Diagnosis not present

## 2021-08-10 DIAGNOSIS — F332 Major depressive disorder, recurrent severe without psychotic features: Secondary | ICD-10-CM

## 2021-08-10 NOTE — Patient Instructions (Addendum)
It was great to see you!  Increase losartan to 50 mg daily CONSIDER sleep study  Send me a mychart message of average readings in the next 4 weeks  Take care,  Inda Coke PA-C

## 2021-08-10 NOTE — Progress Notes (Signed)
Leslie Douglas is a 56 y.o. female here for a follow up of hypertension.   History of Present Illness:   Chief Complaint  Patient presents with   Hypertension    Pt has been checking Bp at home systolic 767-341 and diastolic 93-790.    HPI  HTN Patient here to follow-up. Currently taking Losartan 25 mg daily with no complications. At home blood pressure readings are: checked regularly. States her BP ranging 240-973 systolic and 53-299 diastolic at home. She has been tolerating medication with no side effects. Patient denies chest pain, SOB, blurred vision, dizziness, unusual headaches, lower leg swelling. Patient is currently compliant with medication. At this time, she would like to trial higher dosage of Losartan. Denies excessive caffeine intake, stimulant usage, excessive alcohol intake, or increase in salt consumption.  BP Readings from Last 3 Encounters:  08/10/21 130/90  07/22/21 110/80  07/13/21 (!) 142/94   Past Medical History:  Diagnosis Date   Allergy    Anxiety    Back pain    Binge eating disorder    Borderline personality disorder (HCC)    COPD (chronic obstructive pulmonary disease) (HCC)    chronic bronchitis   Depression    Edema, lower extremity    Hip pain    History of chicken pox    Joint pain    Memory loss    Pancolitis (HCC)    Poor concentration    Seasonal allergies    Skin cancer, basal cell    basal cell   Tinnitus    Vitamin D deficiency      Social History   Tobacco Use   Smoking status: Never   Smokeless tobacco: Never  Vaping Use   Vaping Use: Never used  Substance Use Topics   Alcohol use: Not Currently    Comment: rarely   Drug use: Never    Past Surgical History:  Procedure Laterality Date   CHOLECYSTECTOMY  2003   MOHS SURGERY  10/2017   SKIN SURGERY  05/2020   nose skin cancer   VAGINAL DELIVERY  1995   WISDOM TOOTH EXTRACTION  12/2017    Family History  Problem Relation Age of Onset   Hypertension Mother     Diabetes Father    Colon cancer Neg Hx    Esophageal cancer Neg Hx    Rectal cancer Neg Hx    Stomach cancer Neg Hx    Pancreatic cancer Neg Hx    Inflammatory bowel disease Neg Hx    Liver disease Neg Hx    Allergic rhinitis Neg Hx    Angioedema Neg Hx    Asthma Neg Hx    Eczema Neg Hx    Urticaria Neg Hx    Immunodeficiency Neg Hx    Atopy Neg Hx    Mental illness Neg Hx     Allergies  Allergen Reactions   Doxycycline Rash and Swelling    Mouth and face   Erythromycin Shortness Of Breath   Sulfa Antibiotics Shortness Of Breath   Emsam [Selegiline] Dermatitis    Allergic reaction to patch   Saxenda [Liraglutide -Weight Management] Nausea Only   Penicillins Rash    Current Medications:   Current Outpatient Medications:    ARIPiprazole (ABILIFY) 10 MG tablet, Take 1 tablet (10 mg total) by mouth daily., Disp: 30 tablet, Rfl: 1   EPINEPHrine (EPIPEN 2-PAK) 0.3 mg/0.3 mL IJ SOAJ injection, Inject 0.3 mLs (0.3 mg total) into the muscle as needed for anaphylaxis., Disp:  1 each, Rfl: 1   fluticasone (FLONASE) 50 MCG/ACT nasal spray, Place 2 sprays into both nostrils daily as needed for allergies or rhinitis., Disp: 16 g, Rfl: 5   hydrOXYzine (VISTARIL) 25 MG capsule, Take 1-2 capsules (25-50 mg total) by mouth at bedtime as needed. For sleep, Disp: 60 capsule, Rfl: 1   ipratropium (ATROVENT) 0.03 % nasal spray, Place 1 spray into both nostrils 4 (four) times daily. Use as directed. (Patient taking differently: Place 1 spray into both nostrils 4 (four) times daily as needed. Use as directed.), Disp: 30 mL, Rfl: 5   levocetirizine (XYZAL) 5 MG tablet, Take 1 tablet (5 mg total) by mouth every evening. (Patient taking differently: Take 5 mg by mouth at bedtime as needed.), Disp: 30 tablet, Rfl: 5   losartan (COZAAR) 25 MG tablet, Take 1 tablet (25 mg total) by mouth daily., Disp: 90 tablet, Rfl: 0   mesalamine (LIALDA) 1.2 g EC tablet, TAKE FOUR TABLETS BY MOUTH DAILY WITH BREAKFAST,  Disp: 120 tablet, Rfl: 11   propranolol (INDERAL) 10 MG tablet, Take 1 tablet (10 mg total) by mouth 3 (three) times daily as needed. For anxiety, Disp: 90 tablet, Rfl: 1   Vilazodone HCl 20 MG TABS, Take 1.5 tablets (30 mg total) by mouth daily with supper., Disp: 45 tablet, Rfl: 1   Review of Systems:   ROS Negative unless otherwise specified per HPI.  Vitals:   Vitals:   08/10/21 1517  BP: 130/90  Pulse: 78  Temp: (!) 97.4 F (36.3 C)  TempSrc: Temporal  SpO2: 99%  Weight: 297 lb 4 oz (134.8 kg)  Height: 5' 7"  (1.702 m)     Body mass index is 46.56 kg/m.  Physical Exam:   Physical Exam Vitals and nursing note reviewed.  Constitutional:      General: She is not in acute distress.    Appearance: She is well-developed. She is not ill-appearing or toxic-appearing.  Cardiovascular:     Rate and Rhythm: Normal rate and regular rhythm.     Pulses: Normal pulses.     Heart sounds: Normal heart sounds, S1 normal and S2 normal.  Pulmonary:     Effort: Pulmonary effort is normal.     Breath sounds: Normal breath sounds.  Skin:    General: Skin is warm and dry.  Neurological:     Mental Status: She is alert.     GCS: GCS eye subscore is 4. GCS verbal subscore is 5. GCS motor subscore is 6.  Psychiatric:        Speech: Speech normal.        Behavior: Behavior normal. Behavior is cooperative.     Assessment and Plan:   Primary hypertension Uncontrolled but improved Increase losartan to 50 mg daily Consider sleep study -- she declines at this time Send me a mychart message of average readings in the next 4 weeks Follow-up sooner if concerns    I,Savera Zaman,acting as a scribe for [employer], PA.,have documented all relevant documentation on the behalf of Inda Coke, PA,as directed by  Inda Coke, PA while in the presence of Inda Coke, [place].   I, Inda Coke, [place], have reviewed all documentation for this visit. The documentation on 08/10/21 for  the exam, diagnosis, procedures, and orders are all accurate and complete.   Inda Coke, PA-C

## 2021-08-19 ENCOUNTER — Encounter (INDEPENDENT_AMBULATORY_CARE_PROVIDER_SITE_OTHER): Payer: Self-pay

## 2021-09-03 ENCOUNTER — Telehealth (INDEPENDENT_AMBULATORY_CARE_PROVIDER_SITE_OTHER): Payer: No Typology Code available for payment source | Admitting: Psychiatry

## 2021-09-03 ENCOUNTER — Encounter: Payer: Self-pay | Admitting: Psychiatry

## 2021-09-03 DIAGNOSIS — F3341 Major depressive disorder, recurrent, in partial remission: Secondary | ICD-10-CM

## 2021-09-03 DIAGNOSIS — F411 Generalized anxiety disorder: Secondary | ICD-10-CM | POA: Diagnosis not present

## 2021-09-03 DIAGNOSIS — F603 Borderline personality disorder: Secondary | ICD-10-CM | POA: Diagnosis not present

## 2021-09-03 DIAGNOSIS — F5081 Binge eating disorder: Secondary | ICD-10-CM

## 2021-09-03 MED ORDER — HYDROXYZINE PAMOATE 25 MG PO CAPS: 25.0000 mg | ORAL_CAPSULE | Freq: Every evening | ORAL | 1 refills | Status: DC | PRN

## 2021-09-03 MED ORDER — VYVANSE 10 MG PO CHEW: 10.0000 mg | CHEWABLE_TABLET | Freq: Every day | ORAL | 0 refills | Status: DC

## 2021-09-03 MED ORDER — VILAZODONE HCL 20 MG PO TABS: 30.0000 mg | ORAL_TABLET | Freq: Every day | ORAL | 1 refills | Status: DC

## 2021-09-03 NOTE — Progress Notes (Signed)
Virtual Visit via Video Note  I connected with Leslie Douglas on 09/03/21 at  4:00 PM EDT by a video enabled telemedicine application and verified that I am speaking with the correct person using two identifiers.  Location Provider Location : [facility] Patient Location : Home  Participants: Patient , Provider    I discussed the limitations of evaluation and management by telemedicine and the availability of in person appointments. The patient expressed understanding and agreed to proceed.   I discussed the assessment and treatment plan with the patient. The patient was provided an opportunity to ask questions and all were answered. The patient agreed with the plan and demonstrated an understanding of the instructions.   The patient was advised to call back or seek an in-person evaluation if the symptoms worsen or if the condition fails to improve as anticipated.  [facility] MD OP Progress Note  09/03/2021 6:20 PM Leslie Douglas  MRN:  [id number]  Chief Complaint:  Chief Complaint  Patient presents with   Follow-up: 56 year old Caucasian female with history of depression, anxiety, presented for medication management.   HPI: Leslie Douglas is a 56 year old Caucasian female, employed, separated, lives in [place], has a history of MDD, GAD, binge eating disorder, borderline personality disorder was evaluated by telemedicine today.  Patient today reports 2 weeks ago she went through severe depression.  She reports she had trouble waking up in the morning, felt sad, down, had low energy, lack of motivation.  She reports it lasted for almost 2-1/2 weeks and then it started getting better.  The last 1 week she has been feeling much better.  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  Continues to follow-up with her therapist.  Continues to struggle with  binge eating episodes.  She reports she binges on food, eats huge quantity of food at 1 sitting, often worries about it after she has her binging episode, worries about her weight gain, is often humiliated and embarrassed to eat in the company of others and so on.  Patient is interested in a trial of Vyvanse.  Patient currently denies any suicidality, homicidality or perceptual disturbances.  Patient is currently compliant on her medications, denies side effects.  Patient denies any other concerns today.  Visit Diagnosis:    ICD-10-CM   1. MDD (major depressive disorder), recurrent, in partial remission (HCC)  F33.41 Vilazodone HCl 20 MG TABS    hydrOXYzine (VISTARIL) 25 MG capsule    2. GAD (generalized anxiety disorder)  F41.1     3. Binge eating disorder  F50.81 Lisdexamfetamine Dimesylate (VYVANSE) 10 MG CHEW    4. Borderline personality disorder ([place])  F60.3       Past Psychiatric History: Reviewed past psychiatric history from progress note on 05/19/2021.  I have reviewed notes per Dr. Daron Offer, previous provider.  Past trials of medications like Lamictal, Abilify, Prozac, amitriptyline, Lexapro, selegiline patch, Pristiq.  History of DBT counseling-[facility] counseling in 2021.  Completed [facility] therapy.    Past Medical History:  Past Medical History:  Diagnosis Date   Allergy    Anxiety    Back pain    Binge eating disorder    Borderline personality disorder (HCC)    COPD (chronic obstructive pulmonary disease) (HCC)    chronic bronchitis   Depression    Edema, lower extremity    Hip pain  History of chicken pox    Joint pain    Memory loss    Pancolitis (HCC)    Poor concentration    Seasonal allergies    Skin cancer, basal cell    basal cell   Tinnitus    Vitamin D deficiency     Past Surgical History:  Procedure Laterality Date   CHOLECYSTECTOMY  2003   MOHS SURGERY  10/2017   SKIN SURGERY  05/2020   nose skin cancer   VAGINAL DELIVERY  1995   WISDOM TOOTH  EXTRACTION  12/2017    Family Psychiatric History: Reviewed family psychiatric history from progress note on 05/19/2021.  Family History:  Family History  Problem Relation Age of Onset   Hypertension Mother    Diabetes Father    Colon cancer Neg Hx    Esophageal cancer Neg Hx    Rectal cancer Neg Hx    Stomach cancer Neg Hx    Pancreatic cancer Neg Hx    Inflammatory bowel disease Neg Hx    Liver disease Neg Hx    Allergic rhinitis Neg Hx    Angioedema Neg Hx    Asthma Neg Hx    Eczema Neg Hx    Urticaria Neg Hx    Immunodeficiency Neg Hx    Atopy Neg Hx    Mental illness Neg Hx     Social History: Reviewed social history from progress note on 05/19/2021. Social History   Socioeconomic History   Marital status: Married    Spouse name: Not on file   Number of children: 3   Years of education: Not on file   Highest education level: Associate degree: academic program  Occupational History   Occupation: Aeronautical engineer    Comment: [employer]  Tobacco Use   Smoking status: Never   Smokeless tobacco: Never  Vaping Use   Vaping Use: Never used  Substance and Sexual Activity   Alcohol use: Not Currently    Comment: rarely   Drug use: Never   Sexual activity: Not Currently    Birth control/protection: None  Other Topics Concern   Not on file  Social History Narrative      Works for [employer], has Associate Degree   3 children   Right handed    Social Determinants of Health   Financial Resource Strain: Not on file  Food Insecurity: Not on file  Transportation Needs: Not on file  Physical Activity: Not on file  Stress: Not on file  Social Connections: Not on file    Allergies:  Allergies  Allergen Reactions   Doxycycline Rash and Swelling    Mouth and face   Erythromycin Shortness Of Breath   Sulfa Antibiotics Shortness Of Breath   Emsam [Selegiline] Dermatitis    Allergic reaction to patch   Saxenda [Liraglutide -Weight Management] Nausea Only    Penicillins Rash    Metabolic Disorder Labs: Lab Results  Component Value Date   HGBA1C 5.4 05/27/2021   Lab Results  Component Value Date   PROLACTIN 3.6 (L) 05/27/2021   Lab Results  Component Value Date   CHOL 225 (H) 03/31/2021   TRIG 154.0 (H) 03/31/2021   HDL 54.10 03/31/2021   CHOLHDL 4 03/31/2021   VLDL 30.8 03/31/2021   LDLCALC 140 (H) 03/31/2021   LDLCALC 153 (H) 10/31/2019   Lab Results  Component Value Date   TSH 2.000 05/27/2021   TSH 2.32 03/27/2019    Therapeutic Level Labs: No results found for: "LITHIUM"  No results found for: "VALPROATE" No results found for: "CBMZ"  Current Medications: Current Outpatient Medications  Medication Sig Dispense Refill   ARIPiprazole (ABILIFY) 10 MG tablet Take 1 tablet (10 mg total) by mouth daily. 30 tablet 1   EPINEPHrine (EPIPEN 2-PAK) 0.3 mg/0.3 mL IJ SOAJ injection Inject 0.3 mLs (0.3 mg total) into the muscle as needed for anaphylaxis. 1 each 1   fluticasone (FLONASE) 50 MCG/ACT nasal spray Place 2 sprays into both nostrils daily as needed for allergies or rhinitis. 16 g 5   ipratropium (ATROVENT) 0.03 % nasal spray Place 1 spray into both nostrils 4 (four) times daily. Use as directed. (Patient taking differently: Place 1 spray into both nostrils 4 (four) times daily as needed. Use as directed.) 30 mL 5   levocetirizine (XYZAL) 5 MG tablet Take 1 tablet (5 mg total) by mouth every evening. (Patient taking differently: Take 5 mg by mouth at bedtime as needed.) 30 tablet 5   Lisdexamfetamine Dimesylate (VYVANSE) 10 MG CHEW Chew 10 mg by mouth daily. 30 tablet 0   losartan (COZAAR) 50 MG tablet Take 50 mg by mouth daily.     mesalamine (LIALDA) 1.2 g EC tablet TAKE FOUR TABLETS BY MOUTH DAILY WITH BREAKFAST 120 tablet 11   propranolol (INDERAL) 10 MG tablet Take 1 tablet (10 mg total) by mouth 3 (three) times daily as needed. For anxiety 90 tablet 1   hydrOXYzine (VISTARIL) 25 MG capsule Take 1-2 capsules (25-50 mg  total) by mouth at bedtime as needed. For sleep 60 capsule 1   Vilazodone HCl 20 MG TABS Take 1.5 tablets (30 mg total) by mouth daily with supper. 45 tablet 1   No current facility-administered medications for this visit.     Musculoskeletal: Strength & Muscle Tone:  UTA Gait & Station:  Seated Patient leans: N/A  Psychiatric Specialty Exam: Review of Systems  Psychiatric/Behavioral:  Positive for dysphoric mood. The patient is nervous/anxious.   All other systems reviewed and are negative.   There were no vitals taken for this visit.There is no height or weight on file to calculate BMI.  General Appearance: Casual  Eye Contact:  Fair  Speech:  Clear and Coherent  Volume:  Normal  Mood:  Anxious and Depressed  Affect:  Congruent  Thought Process:  Goal Directed and Descriptions of Associations: Intact  Orientation:  Full (Time, Place, and Person)  Thought Content: Logical   Suicidal Thoughts:  No  Homicidal Thoughts:  No  Memory:  Immediate;   Fair Recent;   Fair Remote;   Fair  Judgement:  Fair  Insight:  Fair  Psychomotor Activity:  Normal  Concentration:  Concentration: Fair and Attention Span: Fair  Recall:  [employer] of Knowledge: Fair  Language: Fair  Akathisia:  No  Handed:  Right  AIMS (if indicated): not done  Assets:  Communication Skills Desire for Improvement Housing Social Support  ADL's:  Intact  Cognition: WNL  Sleep:  Fair   Screenings: [facility] Office Visit from 07/17/2021 in [facility] Visit from 05/19/2021 in [facility] Total Score 0 0      GAD-7    Flowsheet Row Video Visit from 09/03/2021 in [facility] Visit from 06/18/2021 in [facility] Visit from 05/19/2021 in [facility] Visit from 10/31/2019 in [facility] Visit from  05/15/2018 in [facility] [place]  [facility]  Total GAD-7 Score 9 13 16 15 12       PHQ2-9    Flowsheet Row Video Visit from 09/03/2021 in [facility] Office Visit from 07/17/2021 in [facility] Office Visit from 06/18/2021 in [facility] Office Visit from 05/19/2021 in [facility] Office Visit from 10/31/2019 in [facility]  PHQ-2 Total Score 4 6 6 6 6   PHQ-9 Total Score 11 15 22 24 17       Flowsheet Row Video Visit from 09/03/2021 in [facility] Office Visit from 07/17/2021 in [facility] Office Visit from 06/18/2021 in [facility] No Risk No Risk Low Risk        Assessment and Plan: Leslie Douglas is a 56 year old Caucasian female, employed, separated, lives in [place], has a history of depression, anxiety, binge eating disorder, hypertension, chronic colitis was evaluated by telemedicine today.  Patient with recent depression symptoms although currently improving continues to struggle with binge eating, will benefit from the following plan.  Plan MDD in partial remission Abilify 10 mg daily Viibryd 30 mg p.o. daily Hydroxyzine 25 mg p.o. nightly as needed Continue CBT/DBT with therapist at [facility] counseling  GAD-improving Propranolol 10 mg 3 times daily. Continue CBT/DBT  Borderline personality disorder-unstable Continue therapy  Binge eating disorder-unstable Will start Vyvanse 10 mg p.o. daily.  Provided medication education. Reviewed [place] PMP aware Will continue to monitor her blood pressure.  Patient to come into the office for in person visit in 4 weeks. Patient to continue CBT.  Follow-up in clinic in 4 weeks or sooner if needed.  Collaboration of Care: Collaboration of Care: Referral or follow-up with counselor/therapist [facility]  encouraged to continue CBT  Patient/Guardian was advised Release of Information must be obtained prior to any record release in order to collaborate their care with an outside provider. Patient/Guardian was advised if they have not already done so to contact the registration department to sign all necessary forms in order for [name] to release information regarding their care.   Consent: Patient/Guardian gives verbal consent for treatment and assignment of benefits for services provided during this visit. Patient/Guardian expressed understanding and agreed to proceed.   This note was generated in part or whole with voice recognition software. Voice recognition is usually quite accurate but there are transcription errors that can and very often do occur. I apologize for any typographical errors that were not detected and corrected.      Ursula Alert, MD 09/03/2021, 6:20 PM

## 2021-09-05 IMAGING — MR MR HEAD W/O CM
10 series · 48 of 48 positions shown · non-contrast
Comparison: Trigeminal study same day

CLINICAL DATA: Memory loss.  Left-sided trigeminal neuralgia.

EXAM:
MRI HEAD WITHOUT CONTRAST
TECHNIQUE: Multiplanar, multiecho pulse sequences of the brain and surrounding
structures were obtained without intravenous contrast.

[Series 2: t1_se_sag · sagittal · 5.0mm · 0.45mm/px · 1 of 23 slices shown]
[im 1/23]
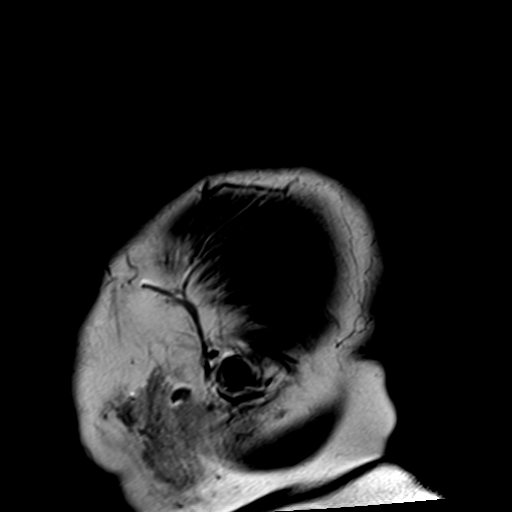

[Series 3: t2_tse_tra_512 · axial · 5.0mm · 0.60mm/px · z∈[-73,+76]mm · 2 of 26 slices shown]
[im 1/26]
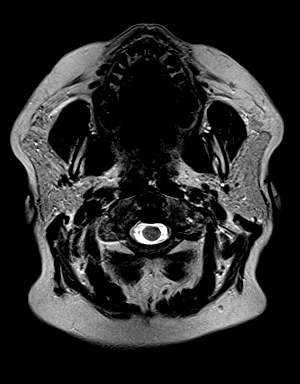
[im 26/26]
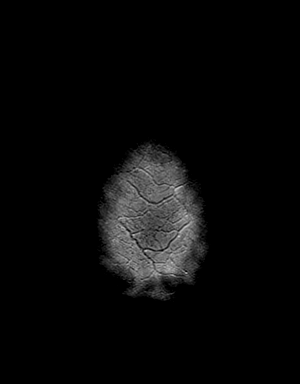

[Series 4: ep2d_diff_ · axial · 3.0mm · 1.80mm/px · z∈[-68,+77]mm · 9 of 100 slices shown]
[im 1/100]
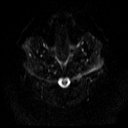
[im 13/100]
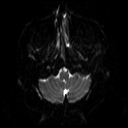
[im 25/100]
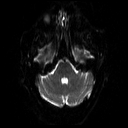
[im 38/100]
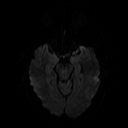
[im 50/100]
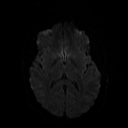
[im 62/100]
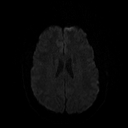
[im 75/100]
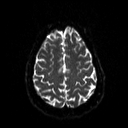
[im 87/100]
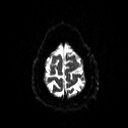
[im 100/100]
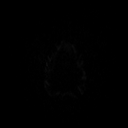

[Series 5: ep2d_diff__adc · axial · 3.0mm · 1.80mm/px · z∈[-68,+77]mm · 5 of 50 slices shown]
[im 1/50]
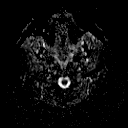
[im 13/50]
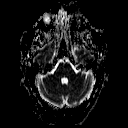
[im 25/50]
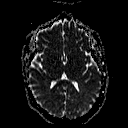
[im 37/50]
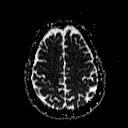
[im 50/50]
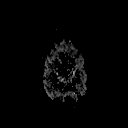

[Series 6: ep2d_diff_cor · coronal · 5.0mm · 1.77mm/px · 5 of 51 slices shown]
[im 1/51]
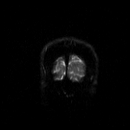
[im 13/51]
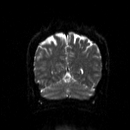
[im 26/51]
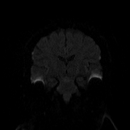
[im 38/51]
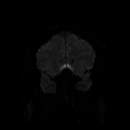
[im 51/51]
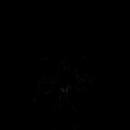

[Series 7: ep2d_diff_cor_adc · coronal · 5.0mm · 1.77mm/px · 3 of 27 slices shown]
[im 1/27]
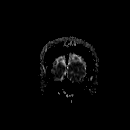
[im 14/27]
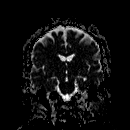
[im 27/27]
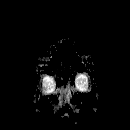

[Series 9: swi_images · axial · 4.0mm · 0.90mm/px · z∈[-54,+81]mm · 3 of 36 slices shown]
[im 1/36]
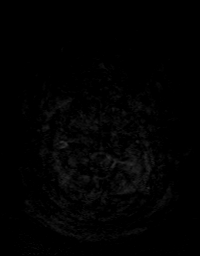
[im 18/36]
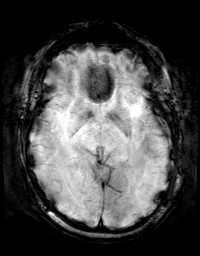
[im 36/36]
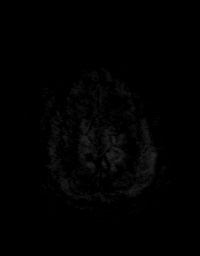

[Series 10: FLAIR · axial · 3.0mm · 0.43mm/px · z∈[-64,+87]mm · 3 of 27 slices shown]
[im 1/27]
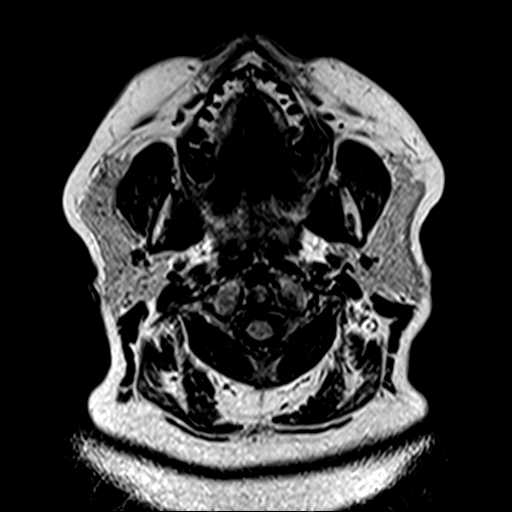
[im 14/27]
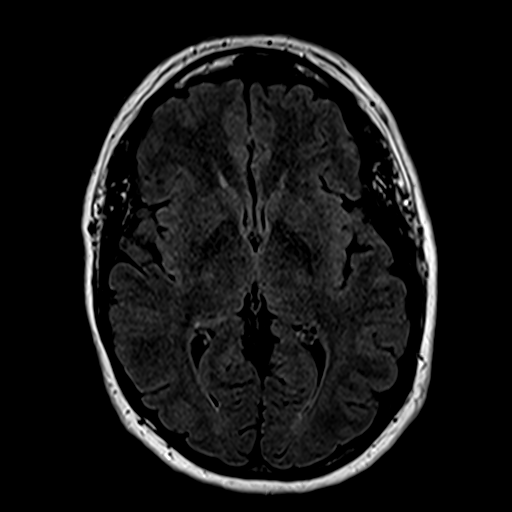
[im 27/27]
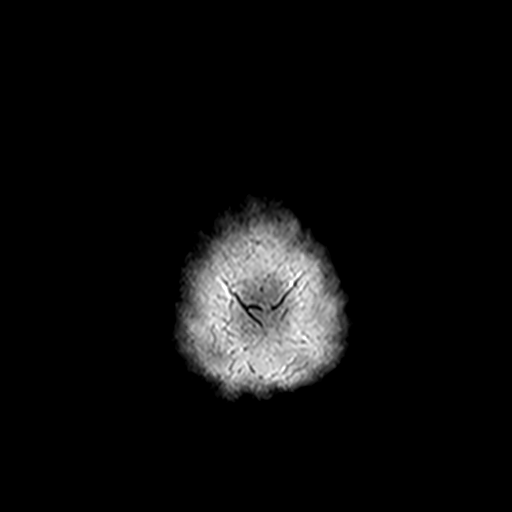

[Series 11: t1_mpr_tra · axial · 1.0mm · 0.72mm/px · z∈[-56,+83]mm · 14 of 144 slices shown]
[im 1/144]
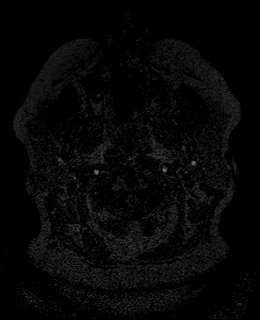
[im 12/144]
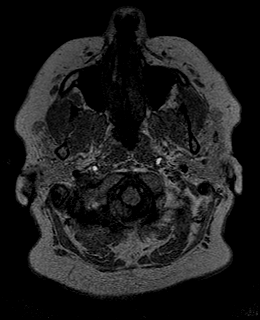
[im 23/144]
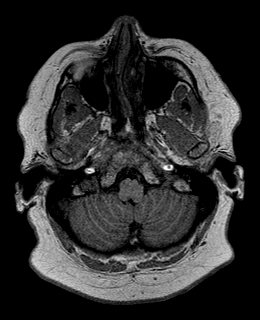
[im 34/144]
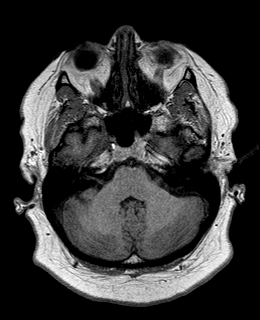
[im 45/144]
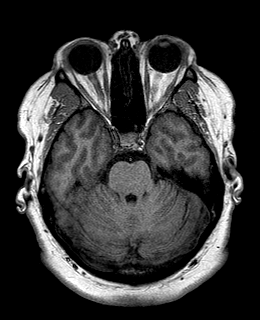
[im 56/144]
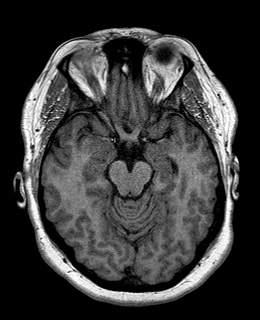
[im 67/144]
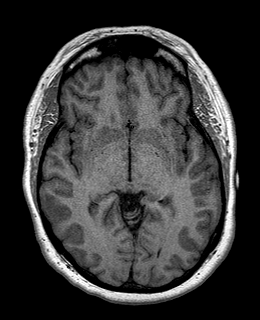
[im 78/144]
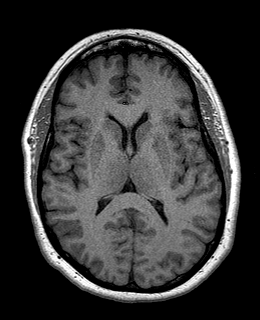
[im 89/144]
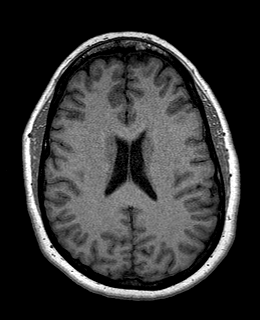
[im 100/144]
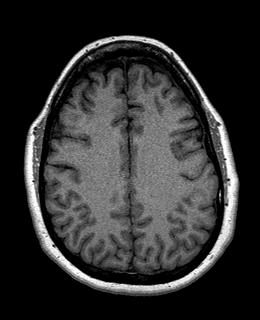
[im 111/144]
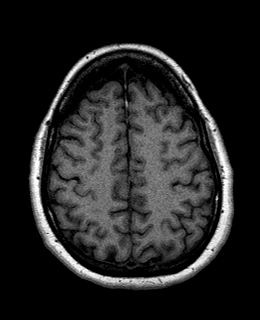
[im 122/144]
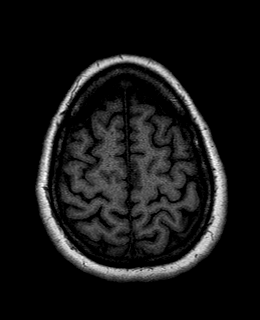
[im 133/144]
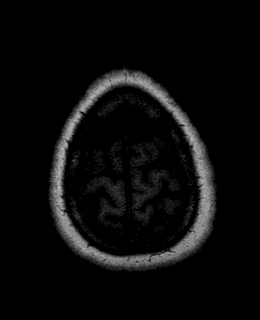
[im 144/144]
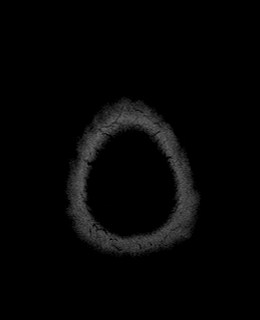

[Series 12: T2 · coronal · 5.0mm · 0.45mm/px · 3 of 28 slices shown]
[im 1/28]
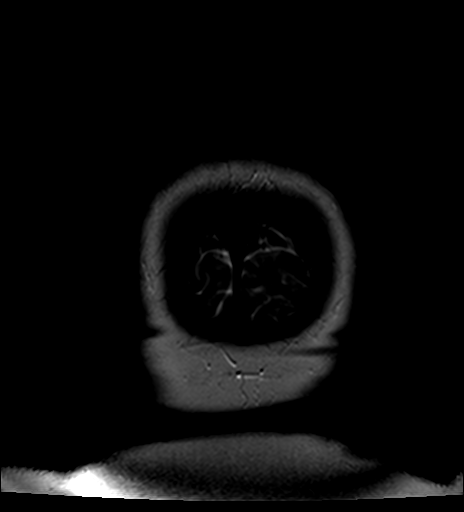
[im 14/28]
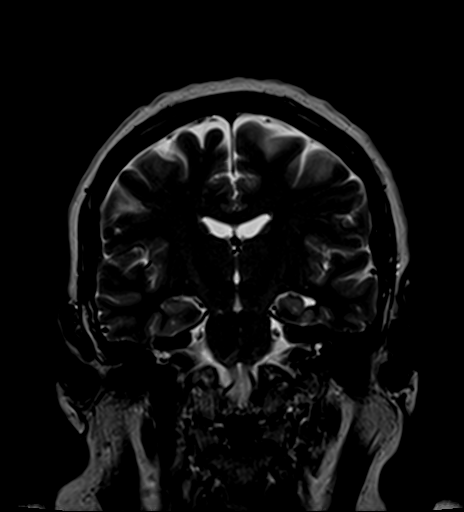
[im 28/28]
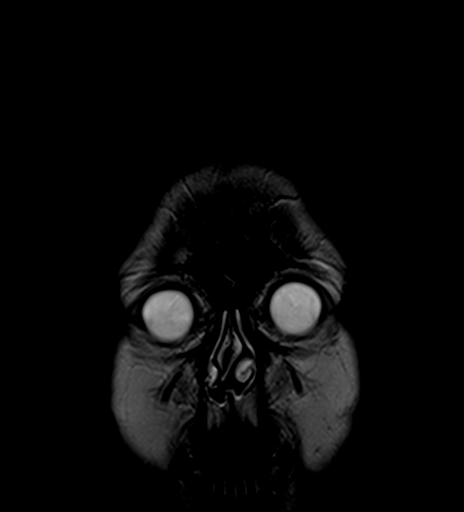

[48 of 48 positions shown; findings below may reference images not displayed]

FINDINGS: Brain: Diffusion imaging does not show any acute or subacute
infarction. No abnormality affects the brainstem or cerebellum.
Cerebral hemispheres do not show atrophy or any evidence of old or
acute small or large vessel infarction. No mass lesion, hemorrhage,
hydrocephalus or extra-axial collection.

Vascular: Major vessels at the base of the brain show flow.

Skull and upper cervical spine: Negative

Sinuses/Orbits: Clear/normal

Other: None
IMPRESSION: Normal MRI of the brain without contrast.

See results of trigeminal examination which showed a left superior
cerebellar artery loop adjacent to the root entry zone of the left
fifth nerve.

## 2021-09-11 ENCOUNTER — Encounter: Payer: Self-pay | Admitting: Physician Assistant

## 2021-09-11 ENCOUNTER — Other Ambulatory Visit: Payer: Self-pay | Admitting: Physician Assistant

## 2021-09-11 MED ORDER — LOSARTAN POTASSIUM 50 MG PO TABS: 75.0000 mg | ORAL_TABLET | Freq: Every day | ORAL | 1 refills | Status: DC

## 2021-10-01 ENCOUNTER — Encounter: Payer: Self-pay | Admitting: Psychiatry

## 2021-10-01 ENCOUNTER — Ambulatory Visit (INDEPENDENT_AMBULATORY_CARE_PROVIDER_SITE_OTHER): Payer: No Typology Code available for payment source | Admitting: Psychiatry

## 2021-10-01 VITALS — BP 165/94 | HR 71 | Temp 98.5°F | Ht 67.0 in | Wt 295.0 lb

## 2021-10-01 DIAGNOSIS — F411 Generalized anxiety disorder: Secondary | ICD-10-CM

## 2021-10-01 DIAGNOSIS — F3341 Major depressive disorder, recurrent, in partial remission: Secondary | ICD-10-CM

## 2021-10-01 DIAGNOSIS — R030 Elevated blood-pressure reading, without diagnosis of hypertension: Secondary | ICD-10-CM

## 2021-10-01 DIAGNOSIS — F5081 Binge eating disorder: Secondary | ICD-10-CM

## 2021-10-01 DIAGNOSIS — F603 Borderline personality disorder: Secondary | ICD-10-CM

## 2021-10-01 DIAGNOSIS — F50819 Binge eating disorder, unspecified: Secondary | ICD-10-CM

## 2021-10-01 MED ORDER — VILAZODONE HCL 40 MG PO TABS: 40.0000 mg | ORAL_TABLET | Freq: Every day | ORAL | 1 refills | Status: DC

## 2021-10-01 MED ORDER — ARIPIPRAZOLE 10 MG PO TABS: 10.0000 mg | ORAL_TABLET | Freq: Every day | ORAL | 1 refills | Status: DC

## 2021-10-01 NOTE — Progress Notes (Signed)
[facility] MD OP Progress Note  10/01/2021 5:48 PM Leslie Douglas  MRN:  [id number]  Chief Complaint:  Chief Complaint  Patient presents with   Follow-up: 56 year old Caucasian female with history of depression, anxiety, binge eating disorder presented for medication management   HPI: Leslie Douglas is a 56 year old Caucasian female, employed, separated, lives in [place], has a history of MDD, GAD, binge eating disorder, borderline personality disorder was evaluated in office today.  Patient today reports she continues to struggle with anxiety, feeling nervous about things, worrying about things in general.  It has not changed much.  She also continues to struggle with sadness, anhedonia, low energy although with some improvement on the current medication regimen.  Patient reports her binge eating episodes have decreased and since her last visit she may have had only 1 episode.  She is currently on the Vyvanse, denies any worsening anxiety or sleep problems.  Denies any other side effects.  Patient continues to follow-up with her therapist with [facility].  She sees her therapist every 3 weeks or so.  Reports therapy sessions are beneficial.  Patient had recent visit with her primary care provider, her losartan was increased to 75 mg for her blood pressure.  Patient however in session today had blood pressure elevation, it was repeated and the blood pressure went higher up to 165/94.  This was discussed with patient.  Patient to follow-up with primary care provider.  And also agrees to hold the Vyvanse.  Patient currently denies any suicidality, homicidality or perceptual disturbances.  Patient denies any other concerns today.  Visit Diagnosis:    ICD-10-CM   1. MDD (major depressive disorder), recurrent, in partial remission (HCC)  F33.41 Vilazodone HCl (VIIBRYD) 40 MG TABS    2. GAD (generalized anxiety disorder)  F41.1 Vilazodone HCl (VIIBRYD) 40 MG TABS    3. Binge eating  disorder  F50.81     4. Borderline personality disorder ([place])  F60.3 ARIPiprazole (ABILIFY) 10 MG tablet    5. Elevated blood pressure reading  R03.0       Past Psychiatric History: Reviewed past psychiatric history from progress note on 05/19/2021.    Past Medical History:  Past Medical History:  Diagnosis Date   Allergy    Anxiety    Back pain    Binge eating disorder    Borderline personality disorder ([place])    COPD (chronic obstructive pulmonary disease) (HCC)    chronic bronchitis   Depression    Edema, lower extremity    Hip pain    History of chicken pox    Joint pain    Memory loss    Pancolitis (HCC)    Poor concentration    Seasonal allergies    Skin cancer, basal cell    basal cell   Tinnitus    Vitamin D deficiency     Past Surgical History:  Procedure Laterality Date   CHOLECYSTECTOMY  2003   MOHS SURGERY  10/2017   SKIN SURGERY  05/2020   nose skin cancer   [place] EXTRACTION  12/2017    Family Psychiatric History: Reviewed family psychiatric history from progress note on 05/19/2021.  Family History:  Family History  Problem Relation Age of Onset   Hypertension Mother    Diabetes Father    Colon cancer Neg Hx    Esophageal cancer Neg Hx    Rectal cancer Neg Hx    Stomach cancer Neg Hx  Pancreatic cancer Neg Hx    Inflammatory bowel disease Neg Hx    Liver disease Neg Hx    Allergic rhinitis Neg Hx    Angioedema Neg Hx    Asthma Neg Hx    Eczema Neg Hx    Urticaria Neg Hx    Immunodeficiency Neg Hx    Atopy Neg Hx    Mental illness Neg Hx     Social History: Reviewed social history from progress note on 05/19/2021. Social History   Socioeconomic History   Marital status: Married    Spouse name: Not on file   Number of children: 3   Years of education: Not on file   Highest education level: Associate degree: academic program  Occupational History   Occupation: Aeronautical engineer    Comment: [employer]   Tobacco Use   Smoking status: Never   Smokeless tobacco: Never  Vaping Use   Vaping Use: Never used  Substance and Sexual Activity   Alcohol use: Not Currently    Comment: rarely   Drug use: Never   Sexual activity: Not Currently    Birth control/protection: None  Other Topics Concern   Not on file  Social History Narrative      Works for [employer], has Associate Degree   3 children   Right handed    Social Determinants of Health   Financial Resource Strain: Not on file  Food Insecurity: Not on file  Transportation Needs: Not on file  Physical Activity: Not on file  Stress: Not on file  Social Connections: Not on file    Allergies:  Allergies  Allergen Reactions   Doxycycline Rash and Swelling    Mouth and face   Erythromycin Shortness Of Breath   Sulfa Antibiotics Shortness Of Breath   Emsam [Selegiline] Dermatitis    Allergic reaction to patch   Saxenda [Liraglutide -Weight Management] Nausea Only   Penicillins Rash    Metabolic Disorder Labs: Lab Results  Component Value Date   HGBA1C 5.4 05/27/2021   Lab Results  Component Value Date   PROLACTIN 3.6 (L) 05/27/2021   Lab Results  Component Value Date   CHOL 225 (H) 03/31/2021   TRIG 154.0 (H) 03/31/2021   HDL 54.10 03/31/2021   CHOLHDL 4 03/31/2021   VLDL 30.8 03/31/2021   LDLCALC 140 (H) 03/31/2021   LDLCALC 153 (H) 10/31/2019   Lab Results  Component Value Date   TSH 2.000 05/27/2021   TSH 2.32 03/27/2019    Therapeutic Level Labs: No results found for: "LITHIUM" No results found for: "VALPROATE" No results found for: "CBMZ"  Current Medications: Current Outpatient Medications  Medication Sig Dispense Refill   EPINEPHrine (EPIPEN 2-PAK) 0.3 mg/0.3 mL IJ SOAJ injection Inject 0.3 mLs (0.3 mg total) into the muscle as needed for anaphylaxis. 1 each 1   fluticasone (FLONASE) 50 MCG/ACT nasal spray Place 2 sprays into both nostrils daily as needed for allergies or rhinitis. 16 g  5   Lisdexamfetamine Dimesylate (VYVANSE) 10 MG CHEW Chew 10 mg by mouth daily. 30 tablet 0   losartan (COZAAR) 50 MG tablet Take 1.5 tablets (75 mg total) by mouth daily. 45 tablet 1   mesalamine (LIALDA) 1.2 g EC tablet TAKE FOUR TABLETS BY MOUTH DAILY WITH BREAKFAST 120 tablet 11   propranolol (INDERAL) 10 MG tablet Take 1 tablet (10 mg total) by mouth 3 (three) times daily as needed. For anxiety 90 tablet 1   Vilazodone HCl (VIIBRYD) 40 MG TABS Take 1  tablet (40 mg total) by mouth daily. 30 tablet 1   ARIPiprazole (ABILIFY) 10 MG tablet Take 1 tablet (10 mg total) by mouth daily. 30 tablet 1   hydrOXYzine (VISTARIL) 25 MG capsule Take 1-2 capsules (25-50 mg total) by mouth at bedtime as needed. For sleep (Patient not taking: Reported on 10/01/2021) 60 capsule 1   ipratropium (ATROVENT) 0.03 % nasal spray Place 1 spray into both nostrils 4 (four) times daily. Use as directed. (Patient not taking: Reported on 10/01/2021) 30 mL 5   levocetirizine (XYZAL) 5 MG tablet Take 1 tablet (5 mg total) by mouth every evening. (Patient not taking: Reported on 10/01/2021) 30 tablet 5   No current facility-administered medications for this visit.     Musculoskeletal: Strength & Muscle Tone: within normal limits Gait & Station: normal Patient leans: N/A  Psychiatric Specialty Exam: Review of Systems  Psychiatric/Behavioral:  Positive for decreased concentration and dysphoric mood. The patient is nervous/anxious.   All other systems reviewed and are negative.   Blood pressure (!) 165/94, pulse 71, temperature 98.5 F (36.9 C), temperature source Temporal, height 5' 7"  (1.702 m), weight 295 lb (133.8 kg), SpO2 97 %.Body mass index is 46.2 kg/m.  General Appearance: Casual  Eye Contact:  Fair  Speech:  Normal Rate  Volume:  Normal  Mood:  Anxious and Dysphoric  Affect:  Congruent  Thought Process:  Goal Directed and Descriptions of Associations: Intact  Orientation:  Full (Time, Place, and Person)   Thought Content: Logical   Suicidal Thoughts:  No  Homicidal Thoughts:  No  Memory:  Immediate;   Fair Recent;   Fair Remote;   Fair  Judgement:  Fair  Insight:  Fair  Psychomotor Activity:  Normal  Concentration:  Concentration: Fair and Attention Span: Fair  Recall:  [employer] of Knowledge: Fair  Language: Fair  Akathisia:  No  Handed:  Right  AIMS (if indicated): done  Assets:  Communication Skills Desire for Improvement Social Support Talents/Skills  ADL's:  Intact  Cognition: WNL  Sleep:  Fair   Screenings: [place] Visit from 10/01/2021 in [facility] Office Visit from 07/17/2021 in [facility] Office Visit from 05/19/2021 in [facility] Total Score 0 0 0      [place] Visit from 10/01/2021 in [facility] Video Visit from 09/03/2021 in [facility] Visit from 06/18/2021 in [facility] Visit from 05/19/2021 in [facility] Visit from 10/31/2019 in [facility]  Total GAD-7 Score 9 9 13 16 15       PHQ2-9    [place] Visit from 10/01/2021 in [facility] Video Visit from 09/03/2021 in [facility] Office Visit from 07/17/2021 in [facility] Visit from 06/18/2021 in [facility] Office Visit from 05/19/2021 in [facility]  PHQ-2 Total Score 3 4 6 6 6   PHQ-9 Total Score 10 11 15 22 24       Flowsheet Row Video Visit from 09/03/2021 in [facility] Office Visit from 07/17/2021 in [facility] Office Visit from 06/18/2021 in [facility] No  Risk No Risk Low Risk        Assessment and Plan: Leslie Douglas is a 56 year old Caucasian female, employed,  lives in [place], separated, has a history of depression, anxiety, binge eating disorder, hypertension, with continued mood symptoms as well as elevated blood pressure reading, currently on a stimulant-Vyvanse, will benefit from the following plan.  Plan MDD in partial remission Abilify 10 mg p.o. daily Increase Viibryd to 40 mg p.o. daily Hydroxyzine 25 mg p.o. nightly as needed Continue CBT/DBT with therapist at [facility] counseling.  GAD-unstable Propranolol 10 mg p.o. 3 times daily Continue CBT/DBT Increase Viibryd as noted above   Borderline personality disorder-unstable Continue psychotherapy with [facility] Will coordinate care.  Binge eating disorder-some improvement Patient is currently on Vyvanse 10 mg p.o. daily.  Patient however with elevated blood pressure reading in session today.  Had recent dose of Losartan increased to 75 mg p.o. daily per primary care provider.  If patient continues to have elevated blood pressure reading patient advised to hold the Vyvanse.  Patient to follow up with primary care provider. Continue CBT in the meantime  Elevated blood pressure reading-unstable Patient on Vyvanse, advised to hold the Vyvanse if blood pressure continues to be elevated.  Will follow-up and continue to monitor.  Follow-up in clinic in 4 weeks or sooner if needed.    This note was generated in part or whole with voice recognition software. Voice recognition is usually quite accurate but there are transcription errors that can and very often do occur. I apologize for any typographical errors that were not detected and corrected.    Ursula Alert, MD 10/02/2021, 8:21 AM

## 2021-10-05 ENCOUNTER — Encounter: Payer: Self-pay | Admitting: *Deleted

## 2021-10-14 ENCOUNTER — Other Ambulatory Visit: Payer: Self-pay | Admitting: Physician Assistant

## 2021-10-14 DIAGNOSIS — Z1231 Encounter for screening mammogram for malignant neoplasm of breast: Secondary | ICD-10-CM

## 2021-10-20 ENCOUNTER — Encounter: Payer: Self-pay | Admitting: Physician Assistant

## 2021-10-20 ENCOUNTER — Ambulatory Visit (INDEPENDENT_AMBULATORY_CARE_PROVIDER_SITE_OTHER): Payer: No Typology Code available for payment source | Admitting: Physician Assistant

## 2021-10-20 VITALS — BP 126/90 | HR 61 | Temp 98.4°F | Ht 67.0 in | Wt 296.0 lb

## 2021-10-20 DIAGNOSIS — I10 Essential (primary) hypertension: Secondary | ICD-10-CM

## 2021-10-20 DIAGNOSIS — Z23 Encounter for immunization: Secondary | ICD-10-CM | POA: Diagnosis not present

## 2021-10-20 MED ORDER — LOSARTAN POTASSIUM-HCTZ 50-12.5 MG PO TABS: 1.0000 | ORAL_TABLET | Freq: Every day | ORAL | 0 refills | Status: DC

## 2021-10-20 NOTE — Progress Notes (Signed)
Leslie Douglas is a 56 y.o. female here for a follow up of a pre-existing problem.  History of Present Illness:   Chief Complaint  Patient presents with   Hypertension    Pt has been checking blood pressure averaging 883/25, diastolic is ranging from 49-82.    HPI  Hypertension Her blood pressure is elevated during today's visit. She is recording her blood pressure at home and has had averages of 117/93. She reports of fluid build-up in her legs when she eats salty food. She denies of drinking enough water. Denies chest pain, SOB, palpitations. She was on vyvanse briefly with her psychiatrist but this was stopped due to her BP. BP Readings from Last 3 Encounters:  10/20/21 (!) 126/90  08/10/21 130/90  07/22/21 110/80     Past Medical History:  Diagnosis Date   Allergy    Anxiety    Back pain    Binge eating disorder    Borderline personality disorder (HCC)    COPD (chronic obstructive pulmonary disease) (HCC)    chronic bronchitis   Depression    Edema, lower extremity    Hip pain    History of chicken pox    Joint pain    Memory loss    Pancolitis (HCC)    Poor concentration    Seasonal allergies    Skin cancer, basal cell    basal cell   Tinnitus    Vitamin D deficiency      Social History   Tobacco Use   Smoking status: Never   Smokeless tobacco: Never  Vaping Use   Vaping Use: Never used  Substance Use Topics   Alcohol use: Not Currently    Comment: rarely   Drug use: Never    Past Surgical History:  Procedure Laterality Date   CHOLECYSTECTOMY  2003   MOHS SURGERY  10/2017   SKIN SURGERY  05/2020   nose skin cancer   VAGINAL DELIVERY  1995   WISDOM TOOTH EXTRACTION  12/2017    Family History  Problem Relation Age of Onset   Hypertension Mother    Diabetes Father    Colon cancer Neg Hx    Esophageal cancer Neg Hx    Rectal cancer Neg Hx    Stomach cancer Neg Hx    Pancreatic cancer Neg Hx    Inflammatory bowel disease Neg Hx    Liver  disease Neg Hx    Allergic rhinitis Neg Hx    Angioedema Neg Hx    Asthma Neg Hx    Eczema Neg Hx    Urticaria Neg Hx    Immunodeficiency Neg Hx    Atopy Neg Hx    Mental illness Neg Hx     Allergies  Allergen Reactions   Doxycycline Rash and Swelling    Mouth and face   Erythromycin Shortness Of Breath   Sulfa Antibiotics Shortness Of Breath   Emsam [Selegiline] Dermatitis    Allergic reaction to patch   Saxenda [Liraglutide -Weight Management] Nausea Only   Penicillins Rash    Current Medications:   Current Outpatient Medications:    ARIPiprazole (ABILIFY) 10 MG tablet, Take 1 tablet (10 mg total) by mouth daily., Disp: 30 tablet, Rfl: 1   EPINEPHrine (EPIPEN 2-PAK) 0.3 mg/0.3 mL IJ SOAJ injection, Inject 0.3 mLs (0.3 mg total) into the muscle as needed for anaphylaxis., Disp: 1 each, Rfl: 1   fluticasone (FLONASE) 50 MCG/ACT nasal spray, Place 2 sprays into both nostrils daily as needed  for allergies or rhinitis., Disp: 16 g, Rfl: 5   hydrOXYzine (VISTARIL) 25 MG capsule, Take 1-2 capsules (25-50 mg total) by mouth at bedtime as needed. For sleep, Disp: 60 capsule, Rfl: 1   ipratropium (ATROVENT) 0.03 % nasal spray, Place 1 spray into both nostrils 4 (four) times daily. Use as directed., Disp: 30 mL, Rfl: 5   levocetirizine (XYZAL) 5 MG tablet, Take 1 tablet (5 mg total) by mouth every evening., Disp: 30 tablet, Rfl: 5   losartan-hydrochlorothiazide (HYZAAR) 50-12.5 MG tablet, Take 1 tablet by mouth daily., Disp: 90 tablet, Rfl: 0   mesalamine (LIALDA) 1.2 g EC tablet, TAKE FOUR TABLETS BY MOUTH DAILY WITH BREAKFAST, Disp: 120 tablet, Rfl: 11   propranolol (INDERAL) 10 MG tablet, Take 1 tablet (10 mg total) by mouth 3 (three) times daily as needed. For anxiety, Disp: 90 tablet, Rfl: 1   Vilazodone HCl (VIIBRYD) 40 MG TABS, Take 1 tablet (40 mg total) by mouth daily., Disp: 30 tablet, Rfl: 1   Lisdexamfetamine Dimesylate (VYVANSE) 10 MG CHEW, Chew 10 mg by mouth daily. (Patient  not taking: Reported on 10/20/2021), Disp: 30 tablet, Rfl: 0   Review of Systems:   ROS Negative unless otherwise specified per HPI.   Vitals:   Vitals:   10/20/21 0942 10/20/21 1009  BP: (!) 140/100 (!) 126/90  Pulse: 62 61  Temp: 98.4 F (36.9 C)   TempSrc: Temporal   SpO2: 98%   Weight: 296 lb (134.3 kg)   Height: 5' 7"  (1.702 m)      Body mass index is 46.36 kg/m.  Physical Exam:   Physical Exam Constitutional:      General: She is not in acute distress.    Appearance: Normal appearance. She is not ill-appearing.  HENT:     Head: Normocephalic and atraumatic.     Right Ear: External ear normal.     Left Ear: External ear normal.  Eyes:     Extraocular Movements: Extraocular movements intact.     Pupils: Pupils are equal, round, and reactive to light.  Cardiovascular:     Rate and Rhythm: Normal rate and regular rhythm.     Pulses: Normal pulses.     Heart sounds: Normal heart sounds. No murmur heard.    No gallop.  Pulmonary:     Effort: Pulmonary effort is normal. No respiratory distress.     Breath sounds: Normal breath sounds. No wheezing or rales.  Skin:    General: Skin is warm and dry.  Neurological:     Mental Status: She is alert and oriented to person, place, and time.  Psychiatric:        Judgment: Judgment normal.     Assessment and Plan:   Primary hypertension Above goal No evidence of end-organ damage Will stop losartan 75 mg and start losartan 50-hctz  12.5 mg combo therapy Follow-up in 3 months, sooner if concerns If diastolic remains elevated, will continue to push for sleep study eval   I,Param Shah,acting as a scribe for [employer], PA.,have documented all relevant documentation on the behalf of Inda Coke, PA,as directed by  Inda Coke, PA while in the presence of Inda Coke, [place].  I, Inda Coke, [place], have reviewed all documentation for this visit. The documentation on 10/20/21 for the exam, diagnosis,  procedures, and orders are all accurate and complete.   Inda Coke, PA-C

## 2021-10-20 NOTE — Patient Instructions (Signed)
It was great to see you!  Stop losartan Start losartan-hydrochlorothiazide medication  Please stay well hydrated!  Let's follow-up in 3 months, sooner if you have concerns.  Take care,  Inda Coke PA-C

## 2021-11-06 ENCOUNTER — Encounter: Payer: Self-pay | Admitting: Psychiatry

## 2021-11-06 ENCOUNTER — Ambulatory Visit (INDEPENDENT_AMBULATORY_CARE_PROVIDER_SITE_OTHER): Payer: No Typology Code available for payment source | Admitting: Psychiatry

## 2021-11-06 VITALS — BP 134/84 | HR 88 | Temp 98.3°F | Ht 67.0 in | Wt 298.0 lb

## 2021-11-06 DIAGNOSIS — F5081 Binge eating disorder: Secondary | ICD-10-CM | POA: Diagnosis not present

## 2021-11-06 DIAGNOSIS — F3341 Major depressive disorder, recurrent, in partial remission: Secondary | ICD-10-CM | POA: Diagnosis not present

## 2021-11-06 DIAGNOSIS — F411 Generalized anxiety disorder: Secondary | ICD-10-CM | POA: Diagnosis not present

## 2021-11-06 DIAGNOSIS — F603 Borderline personality disorder: Secondary | ICD-10-CM

## 2021-11-06 DIAGNOSIS — G479 Sleep disorder, unspecified: Secondary | ICD-10-CM

## 2021-11-06 NOTE — Progress Notes (Signed)
[facility] MD OP Progress Note  11/06/2021 1:05 PM Leslie Douglas  MRN:  [id number]  Chief Complaint:  Chief Complaint  Patient presents with   Follow-up   Medication Refill   Depression   Anxiety   HPI: Leslie Douglas is a 56 year old Caucasian female, employed, separated, lives in [place], has a history of MDD, GAD, binge eating disorder, borderline personality disorder was evaluated in office today.  Patient today reports overall mood symptoms as improved.  She does not feel as depressed or anxious as she was last visit.  She also reports she just returned from her vacation.  That may have also helped with how she feels now.  Patient however reports she continues to struggle with excessive tiredness, sleepiness, lack of motivation especially in the morning.  In spite of having a good night's sleep she has trouble getting out of bed.  She has to push herself to do so.  Patient reports if she can go back to sleep now she will be happy to.  She reports she does snore at night.  She also could take naps during the day if circumstances allows it.  She also has leg swelling as well as elevated blood pressure.  She reports her primary care provider had mentioned referral to a sleep study in the past.  Motivated to get it done.  Patient reports she is currently holding the Vyvanse since she has elevated blood pressure.  Recently she was started on losartan hydrochlorothiazide per PMD, has another appointment coming up.  Currently monitoring her blood pressure.  Reports binge eating has improved at this time.  Patient denies any suicidality, homicidality or perceptual disturbances.  Currently compliant on medications like Viibryd, Abilify, denies side effects.  Denies any other concerns today.  Visit Diagnosis:    ICD-10-CM   1. MDD (major depressive disorder), recurrent, in partial remission ([place])  F33.41     2. GAD (generalized anxiety disorder)  F41.1     3. Binge eating disorder   F50.81     4. Borderline personality disorder ([place])  F60.3     5. Sleep disorder, unspecified  G47.9       Past Psychiatric History: Reviewed past psychiatric history from progress note on 05/19/2021.  Past Medical History:  Past Medical History:  Diagnosis Date   Allergy    Anxiety    Back pain    Binge eating disorder    Borderline personality disorder ([place])    COPD (chronic obstructive pulmonary disease) (HCC)    chronic bronchitis   Depression    Edema, lower extremity    Hip pain    History of chicken pox    Joint pain    Memory loss    Pancolitis (HCC)    Poor concentration    Seasonal allergies    Skin cancer, basal cell    basal cell   Tinnitus    Vitamin D deficiency     Past Surgical History:  Procedure Laterality Date   CHOLECYSTECTOMY  2003   MOHS SURGERY  10/2017   SKIN SURGERY  05/2020   nose skin cancer   [place] EXTRACTION  12/2017    Family Psychiatric History: Reviewed family psychiatric history from progress note on 05/19/2021.  Family History:  Family History  Problem Relation Age of Onset   Hypertension Mother    Diabetes Father    Colon cancer Neg Hx    Esophageal cancer Neg Hx  Rectal cancer Neg Hx    Stomach cancer Neg Hx    Pancreatic cancer Neg Hx    Inflammatory bowel disease Neg Hx    Liver disease Neg Hx    Allergic rhinitis Neg Hx    Angioedema Neg Hx    Asthma Neg Hx    Eczema Neg Hx    Urticaria Neg Hx    Immunodeficiency Neg Hx    Atopy Neg Hx    Mental illness Neg Hx     Social History: Reviewed social history from progress note on 05/19/2021. Social History   Socioeconomic History   Marital status: Married    Spouse name: Not on file   Number of children: 3   Years of education: Not on file   Highest education level: Associate degree: academic program  Occupational History   Occupation: Aeronautical engineer    Comment: [employer]  Tobacco Use   Smoking status: Never   Smokeless  tobacco: Never  Vaping Use   Vaping Use: Never used  Substance and Sexual Activity   Alcohol use: Not Currently    Comment: rarely   Drug use: Never   Sexual activity: Not Currently    Birth control/protection: None  Other Topics Concern   Not on file  Social History Narrative      Works for [employer], has Associate Degree   3 children   Right handed    Social Determinants of Health   Financial Resource Strain: Not on file  Food Insecurity: Not on file  Transportation Needs: Not on file  Physical Activity: Not on file  Stress: Not on file  Social Connections: Not on file    Allergies:  Allergies  Allergen Reactions   Doxycycline Rash and Swelling    Mouth and face   Erythromycin Shortness Of Breath   Sulfa Antibiotics Shortness Of Breath   Emsam [Selegiline] Dermatitis    Allergic reaction to patch   Saxenda [Liraglutide -Weight Management] Nausea Only   Penicillins Rash    Metabolic Disorder Labs: Lab Results  Component Value Date   HGBA1C 5.4 05/27/2021   Lab Results  Component Value Date   PROLACTIN 3.6 (L) 05/27/2021   Lab Results  Component Value Date   CHOL 225 (H) 03/31/2021   TRIG 154.0 (H) 03/31/2021   HDL 54.10 03/31/2021   CHOLHDL 4 03/31/2021   VLDL 30.8 03/31/2021   LDLCALC 140 (H) 03/31/2021   LDLCALC 153 (H) 10/31/2019   Lab Results  Component Value Date   TSH 2.000 05/27/2021   TSH 2.32 03/27/2019    Therapeutic Level Labs: No results found for: "LITHIUM" No results found for: "VALPROATE" No results found for: "CBMZ"  Current Medications: Current Outpatient Medications  Medication Sig Dispense Refill   ARIPiprazole (ABILIFY) 10 MG tablet Take 1 tablet (10 mg total) by mouth daily. 30 tablet 1   EPINEPHrine (EPIPEN 2-PAK) 0.3 mg/0.3 mL IJ SOAJ injection Inject 0.3 mLs (0.3 mg total) into the muscle as needed for anaphylaxis. 1 each 1   fluticasone (FLONASE) 50 MCG/ACT nasal spray Place 2 sprays into both nostrils  daily as needed for allergies or rhinitis. 16 g 5   hydrOXYzine (VISTARIL) 25 MG capsule Take 1-2 capsules (25-50 mg total) by mouth at bedtime as needed. For sleep 60 capsule 1   ipratropium (ATROVENT) 0.03 % nasal spray Place 1 spray into both nostrils 4 (four) times daily. Use as directed. 30 mL 5   levocetirizine (XYZAL) 5 MG tablet Take 1 tablet (  5 mg total) by mouth every evening. 30 tablet 5   losartan-hydrochlorothiazide (HYZAAR) 50-12.5 MG tablet Take 1 tablet by mouth daily. 90 tablet 0   mesalamine (LIALDA) 1.2 g EC tablet TAKE FOUR TABLETS BY MOUTH DAILY WITH BREAKFAST 120 tablet 11   propranolol (INDERAL) 10 MG tablet Take 1 tablet (10 mg total) by mouth 3 (three) times daily as needed. For anxiety 90 tablet 1   Vilazodone HCl (VIIBRYD) 40 MG TABS Take 1 tablet (40 mg total) by mouth daily. 30 tablet 1   Lisdexamfetamine Dimesylate (VYVANSE) 10 MG CHEW Chew 10 mg by mouth daily. (Patient not taking: Reported on 11/06/2021) 30 tablet 0   No current facility-administered medications for this visit.     Musculoskeletal: Strength & Muscle Tone: within normal limits Gait & Station: normal Patient leans: N/A  Psychiatric Specialty Exam: Review of Systems  Constitutional:  Positive for fatigue.  Cardiovascular:  Positive for leg swelling.  Psychiatric/Behavioral:  Positive for sleep disturbance. The patient is nervous/anxious.   All other systems reviewed and are negative.   Blood pressure 134/84, pulse 88, temperature 98.3 F (36.8 C), temperature source Temporal, height 5' 7"  (1.702 m), weight 298 lb (135.2 kg).Body mass index is 46.67 kg/m.  General Appearance: Casual  Eye Contact:  Good  Speech:  Clear and Coherent  Volume:  Normal  Mood:  Anxious improving  Affect:  Appropriate  Thought Process:  Goal Directed and Descriptions of Associations: Intact  Orientation:  Full (Time, Place, and Person)  Thought Content: Logical   Suicidal Thoughts:  No  Homicidal Thoughts:   No  Memory:  Immediate;   Fair Recent;   Fair Remote;   Fair  Judgement:  Fair  Insight:  Fair  Psychomotor Activity:  Normal  Concentration:  Concentration: Fair and Attention Span: Fair  Recall:  [employer] of Knowledge: Fair  Language: Fair  Akathisia:  No  Handed:  Right  AIMS (if indicated): done  Assets:  Communication Skills Desire for [place] Talents/Skills Transportation  ADL's:  Intact  Cognition: WNL  Sleep:   excessive   Screenings: Administrator, Civil [employer] Office Visit from 11/06/2021 in [facility] Office Visit from 10/01/2021 in [facility] Office Visit from 07/17/2021 in [facility] Visit from 05/19/2021 in [facility] Total Score 0 0 0 [place] Visit from 11/06/2021 in [facility] Visit from 10/01/2021 in [facility] Video Visit from 09/03/2021 in [facility] Visit from 06/18/2021 in [facility] Visit from 05/19/2021 in [facility]  Total GAD-7 Score 4 9 9 13 16       PHQ2-9    [place] Visit from 11/06/2021 in [facility] Visit from 10/01/2021 in [place] [facility] Visit from 09/03/2021 in [facility] Visit from 07/17/2021 in [place] Visit from 06/18/2021 in [facility]  PHQ-2 Total Score 2 3 4 6 6   PHQ-9 Total Score 8 10 11 15 22       [place] Visit from 11/06/2021 in [place] [facility] Visit from 09/03/2021 in [facility] Office Visit from 07/17/2021 in [facility] [place] No Risk No Risk No Risk  Assessment and Plan: Leslie Douglas is a 56 year old Caucasian female, employed, lives in [place], separated, has a history of depression, binge eating disorder, anxiety, hypertension, was evaluated in office today.  Patient with fatigue, excessive sleepiness, snoring, elevated blood pressure reading and leg swelling, currently holding Vyvanse, may benefit from referral to sleep study, would like to discuss with primary care provider.  Discussed plan as noted below.  Plan MDD-in partial remission Abilify 10 mg p.o. daily Viibryd 40 mg p.o. daily Hydroxyzine 25 mg p.o. nightly as needed  GAD-improving Propranolol 10 mg p.o. 3 times daily as needed Continue CBT/DBT Viibryd as discussed above.  Borderline personality disorder-improving Continue psychotherapy with [facility]   Binge eating disorder-stable Currently holding Vyvanse due to her elevated blood pressure reading. Continue CBT.  Sleep disorder unspecified-unstable Patient plans to discuss with primary care provider about referral for sleep study.   Follow-up in clinic in 2 to 3 months or sooner if needed.   This note was generated in part or whole with voice recognition software. Voice recognition is usually quite accurate but there are transcription errors that can and very often do occur. I apologize for any typographical errors that were not detected and corrected.      Ursula Alert, MD 11/06/2021, 1:05 PM

## 2021-11-10 ENCOUNTER — Ambulatory Visit: Payer: No Typology Code available for payment source

## 2021-11-26 ENCOUNTER — Ambulatory Visit: Payer: No Typology Code available for payment source | Admitting: Psychiatry

## 2021-12-16 ENCOUNTER — Ambulatory Visit: Payer: No Typology Code available for payment source | Admitting: Gastroenterology

## 2021-12-22 ENCOUNTER — Other Ambulatory Visit: Payer: Self-pay | Admitting: Psychiatry

## 2021-12-22 DIAGNOSIS — F411 Generalized anxiety disorder: Secondary | ICD-10-CM

## 2021-12-22 DIAGNOSIS — F3341 Major depressive disorder, recurrent, in partial remission: Secondary | ICD-10-CM

## 2021-12-22 DIAGNOSIS — F603 Borderline personality disorder: Secondary | ICD-10-CM

## 2021-12-30 ENCOUNTER — Encounter: Payer: Self-pay | Admitting: Gastroenterology

## 2021-12-30 ENCOUNTER — Other Ambulatory Visit (INDEPENDENT_AMBULATORY_CARE_PROVIDER_SITE_OTHER): Payer: No Typology Code available for payment source

## 2021-12-30 ENCOUNTER — Ambulatory Visit (INDEPENDENT_AMBULATORY_CARE_PROVIDER_SITE_OTHER): Payer: No Typology Code available for payment source | Admitting: Gastroenterology

## 2021-12-30 VITALS — BP 120/70 | HR 81 | Ht 67.0 in | Wt 299.0 lb

## 2021-12-30 DIAGNOSIS — K52832 Lymphocytic colitis: Secondary | ICD-10-CM

## 2021-12-30 DIAGNOSIS — K529 Noninfective gastroenteritis and colitis, unspecified: Secondary | ICD-10-CM

## 2021-12-30 DIAGNOSIS — K760 Fatty (change of) liver, not elsewhere classified: Secondary | ICD-10-CM

## 2021-12-30 DIAGNOSIS — K5100 Ulcerative (chronic) pancolitis without complications: Secondary | ICD-10-CM | POA: Diagnosis not present

## 2021-12-30 DIAGNOSIS — R7989 Other specified abnormal findings of blood chemistry: Secondary | ICD-10-CM | POA: Diagnosis not present

## 2021-12-30 DIAGNOSIS — R760 Raised antibody titer: Secondary | ICD-10-CM

## 2021-12-30 LAB — SEDIMENTATION RATE: Sed Rate: 8 mm/hr (ref 0–30)

## 2021-12-30 LAB — COMPREHENSIVE METABOLIC PANEL
ALT: 46 U/L — ABNORMAL HIGH (ref 0–35)
AST: 31 U/L (ref 0–37)
Albumin: 4.4 g/dL (ref 3.5–5.2)
Alkaline Phosphatase: 62 U/L (ref 39–117)
BUN: 14 mg/dL (ref 6–23)
CO2: 29 mEq/L (ref 19–32)
Calcium: 9.3 mg/dL (ref 8.4–10.5)
Chloride: 99 mEq/L (ref 96–112)
Creatinine, Ser: 0.97 mg/dL (ref 0.40–1.20)
GFR: 64.38 mL/min (ref >60.00)
Glucose, Bld: 82 mg/dL (ref 70–99)
Potassium: 3.6 mEq/L (ref 3.5–5.1)
Sodium: 136 mEq/L (ref 135–145)
Total Bilirubin: 0.5 mg/dL (ref 0.2–1.2)
Total Protein: 7.6 g/dL (ref 6.0–8.3)

## 2021-12-30 LAB — C-REACTIVE PROTEIN: CRP: <1 mg/dL (ref 0.5–20.0)

## 2021-12-30 MED ORDER — MESALAMINE 1.2 G PO TBEC: DELAYED_RELEASE_TABLET | ORAL | 11 refills | Status: DC

## 2021-12-30 NOTE — Progress Notes (Signed)
[facility] VISIT   Primary Care Provider [place], [place], [place] [address] [place] [address] [phone]  Patient Profile: Leslie Douglas is a 56 y.o. 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  The patient presents to the [facility] for an evaluation and management of problem(s) noted below:  Problem List 1. Lymphocytic colitis   2. 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 Abnormal LFTs   4. Fatty liver   5. Positive Anti-Smooth Muscle antibody      History of Present Illness: Please see prior notes for full details of HPI.  Interval History The patient returns for followup.  She has noted that with her Bismuth therapy, if she was taking too much of this she would get some constipated hard stool and subsequently had some bleeding hemorrhoid issues.  She now uses it every other day anis doing well.  No other issues and she continues on her Lialda.  Her IBD/lymphocytic colitis diary is as below: BMs per day -between 1-2 Nocturnal BMs -none Blood -denies (except the episodes with increased Bismuth therapy) Mucous -none Tenesmus -none Urgency -none Incomplete evacuation -none Skin Manifestations -no new skin cancers Eye Manifestations - no new vision changes Joint Manifestations -no new joint pains  GI Review of Systems Positive as above Negative for dysphagia, nausea, vomiting, abdominal pain  Review of Systems General: Denies fevers/chills/unintentional weight loss HEENT: Denies new oral lesions Cardiovascular: Denies chest pain  Pulmonary: Denies shortness of breath Gastroenterological: See  HPI Genitourinary: Denies darkened urine Hematological: Denies easy bruising Dermatological: As per HPI Psychological: Mood is stable Musculoskeletal: No new significant arthralgias   Medications Current Outpatient Medications  Medication Sig Dispense Refill   ARIPiprazole (ABILIFY) 10 MG tablet TAKE 1 TABLET BY MOUTH DAILY 30 tablet 1   EPINEPHrine (EPIPEN 2-PAK) 0.3 mg/0.3 mL IJ SOAJ injection Inject 0.3 mLs (0.3 mg total) into the muscle as needed for anaphylaxis. 1 each 1   fluticasone (FLONASE) 50 MCG/ACT nasal spray Place 2 sprays into both nostrils daily as needed for allergies or rhinitis. 16 g 5   hydrOXYzine (VISTARIL) 25 MG capsule Take 1-2 capsules (25-50 mg total) by mouth at bedtime as needed. For sleep 60 capsule 1   ipratropium (ATROVENT) 0.03 % nasal spray Place 1 spray into both nostrils 4 (four) times daily. Use as directed. 30 mL 5   levocetirizine (XYZAL) 5 MG tablet Take 1 tablet (5 mg total) by mouth every evening. 30 tablet 5   Lisdexamfetamine Dimesylate (VYVANSE) 10 MG CHEW Chew 10 mg by mouth daily. 30 tablet 0   losartan-hydrochlorothiazide (HYZAAR) 50-12.5 MG tablet Take 1 tablet by mouth daily. 90 tablet 0   propranolol (INDERAL) 10 MG tablet Take 1 tablet (10 mg total) by mouth 3 (three) times daily as needed. For anxiety 90 tablet 1   Vilazodone HCl (VIIBRYD) 40 MG TABS TAKE 1 TABLET BY MOUTH DAILY 30 tablet 1   mesalamine (LIALDA) 1.2 g EC tablet TAKE FOUR TABLETS BY MOUTH DAILY WITH BREAKFAST 120 tablet 11   No current facility-administered medications for this visit.    Allergies Allergies  Allergen Reactions   Doxycycline Rash and Swelling    Mouth and face   Erythromycin Shortness Of Breath   Sulfa Antibiotics  Shortness Of Breath   Emsam [Selegiline] Dermatitis    Allergic reaction to patch   Saxenda [Liraglutide -Weight Management] Nausea Only   Penicillins Rash    Histories Past Medical History:  Diagnosis Date   Allergy    Anxiety     Back pain    Binge eating disorder    Borderline personality disorder (HCC)    COPD (chronic obstructive pulmonary disease) (HCC)    chronic bronchitis   Depression    Edema, lower extremity    Hip pain    History of chicken pox    Joint pain    Memory loss    Pancolitis (HCC)    Poor concentration    Seasonal allergies    Skin cancer, basal cell    basal cell   Tinnitus    Vitamin D deficiency    Past Surgical History:  Procedure Laterality Date   CHOLECYSTECTOMY  2003   MOHS SURGERY  10/2017   SKIN SURGERY  05/2020   nose skin cancer   VAGINAL DELIVERY  1995   WISDOM TOOTH EXTRACTION  12/2017   Social History   Socioeconomic History   Marital status: Married    Spouse name: Not on file   Number of children: 3   Years of education: Not on file   Highest education level: Associate degree: academic program  Occupational History   Occupation: Aeronautical engineer    Comment: [employer]  Tobacco Use   Smoking status: Never   Smokeless tobacco: Never  Vaping Use   Vaping Use: Never used  Substance and Sexual Activity   Alcohol use: Not Currently    Comment: rarely   Drug use: Never   Sexual activity: Not Currently    Birth control/protection: None  Other Topics Concern   Not on file  Social History Narrative      Works for [employer], has Designer, industrial/product   3 children   Right handed    Social Determinants of Radio broadcast assistant Strain: Not on file  Food Insecurity: Not on file  Transportation Needs: Not on file  Physical Activity: Not on file  Stress: Not on file  Social Connections: Not on file  Intimate Partner Violence: Not on file   Family History  Problem Relation Age of Onset   Hypertension Mother    Diabetes Father    Colon cancer Neg Hx    Esophageal cancer Neg Hx    Rectal cancer Neg Hx    Stomach cancer Neg Hx    Pancreatic cancer Neg Hx    Inflammatory bowel disease Neg Hx    Liver disease Neg Hx    Allergic rhinitis  Neg Hx    Angioedema Neg Hx    Asthma Neg Hx    Eczema Neg Hx    Urticaria Neg Hx    Immunodeficiency Neg Hx    Atopy Neg Hx    Mental illness Neg Hx    I have reviewed her medical, social, and family history in detail and updated the electronic medical record as necessary.    PHYSICAL EXAMINATION  BP 120/70 (BP Location: Left Arm, Patient Position: Sitting, Cuff Size: Large)   Pulse 81   Ht _0  (1.702 m)   Wt 299 lb 0.6 oz (135.6 kg)   SpO2 98%   BMI 46.84 kg/m  GEN: NAD, appears stated age, doesn't appear chronically ill PSYCH: Cooperative, without pressured speech EYE: Conjunctivae pink, sclerae anicteric ENT: MMM CV: Nontachycardic RESP: No  audible wheezing GI: NABS, rounded, obese, nontender, without rebound MSK/EXT: No lower extremity edema SKIN: No jaundice, skin rashes noted on bilateral upper extremities with some bullous formation NEURO:  Alert & Oriented x 3, no focal deficits   REVIEW OF DATA  I reviewed the following data at the time of this encounter:  GI Procedures and Studies  No new studies to review  Laboratory Studies  Reviewed in epic  Imaging Studies  No relevant studies to review   ASSESSMENT  Ms. Camarena is a 56 y.o. 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  The patient is seen today for evaluation and management of:  1. Lymphocytic colitis   2. 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 Abnormal LFTs   4. Fatty liver   5. Positive Anti-Smooth Muscle antibody     The patient is clinically and hemodynamically stable.  We will monitor her LFTs.  If her pattern remains as it has, then later in 2024, we will need to consider  potential Liver biopsy to ensure nothing else is being missed and stage degree of fibrosis.  Could query a Fibroscan or U/S elastography as growing data is being used for these individuals with MASLD.  With her persistent elevation in her smooth muscle antibody, we need to keep this in mind should there be any component of autoimmune hepatitis.  From a luminal GI perspective, we will continue her on Lialda.  She will titrate her Bismuth therapy as she feels is most effective for her,  No other issues at present.  Will plan consideration of repeat in 2024 vs 205 depending on how she is doing.  We will see her back in clinic to determine this.   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 but need to keep in mind possible AIH Will plan updated Liver Ultrasound to be performed to ensure nothing else is occurring or any development of echotexture changes   Orders Placed This Encounter  Procedures   Calprotectin, Fecal   Comp Met (CMET)   Sedimentation rate   C-reactive protein    New Prescriptions   No medications on file   Modified Medications   Modified Medication Previous Medication   MESALAMINE (LIALDA) 1.2 G EC TABLET mesalamine (LIALDA) 1.2 g EC tablet      TAKE FOUR TABLETS BY MOUTH DAILY WITH BREAKFAST    TAKE FOUR TABLETS BY MOUTH DAILY WITH BREAKFAST    Planned Follow Up: Return in about 6 months (around 07/01/2022).   Total Time in Face-to-Face and in Coordination of Care for patient including independent/personal interpretation/review of prior  testing, medical history, examination, medication adjustment, communicating results with the patient directly, and documentation with the EHR is 25 minutes.   Justice Britain, MD [facility] Office # [phone]

## 2021-12-30 NOTE — Patient Instructions (Addendum)
Your provider has requested that you go to the basement level for lab work before leaving today. Press "B" on the elevator. The lab is located at the first door on the left as you exit the elevator.  We have sent the following medications to your pharmacy for you to pick up at your convenience: Lialda   Titrate Pepto Bismuth As needed.   Follow up in 6 months.   Due to recent changes in healthcare laws, you may see the results of your imaging and laboratory studies on MyChart before your provider has had a chance to review them.  We understand that in some cases there may be results that are confusing or concerning to you. Not all laboratory results come back in the same time frame and the provider may be waiting for multiple results in order to interpret others.  Please give [name] 48 hours in order for your provider to thoroughly review all the results before contacting the office for clarification of your results.   Thank you for choosing me and [facility].  Dr. Rush Landmark

## 2022-01-01 ENCOUNTER — Telehealth: Payer: Self-pay

## 2022-01-01 DIAGNOSIS — K760 Fatty (change of) liver, not elsewhere classified: Secondary | ICD-10-CM

## 2022-01-01 DIAGNOSIS — R7989 Other specified abnormal findings of blood chemistry: Secondary | ICD-10-CM

## 2022-01-01 NOTE — Telephone Encounter (Addendum)
Order for liver ultrasound has been placed. Left message of patient.

## 2022-01-01 NOTE — Telephone Encounter (Signed)
-----   Message from Irving Copas., MD sent at 01/01/2022  6:01 AM EST ----- Regarding: Follow up Leslie Douglas, I thought about our patient a bit more. With her persistent LFT abnormalities, let us plan an updated Liver Ultrasound. Can be done anytime. Thanks. GM

## 2022-01-06 ENCOUNTER — Ambulatory Visit: Payer: No Typology Code available for payment source

## 2022-01-19 ENCOUNTER — Ambulatory Visit: Payer: No Typology Code available for payment source

## 2022-01-20 ENCOUNTER — Ambulatory Visit: Payer: No Typology Code available for payment source | Admitting: Physician Assistant

## 2022-01-21 ENCOUNTER — Encounter: Payer: Self-pay | Admitting: Gastroenterology

## 2022-01-21 ENCOUNTER — Ambulatory Visit: Payer: No Typology Code available for payment source | Admitting: Physician Assistant

## 2022-01-21 ENCOUNTER — Encounter: Payer: Self-pay | Admitting: Physician Assistant

## 2022-01-21 VITALS — BP 130/84 | HR 85 | Temp 97.5°F | Ht 67.0 in | Wt 295.5 lb

## 2022-01-21 DIAGNOSIS — Z6841 Body Mass Index (BMI) 40.0 and over, adult: Secondary | ICD-10-CM | POA: Diagnosis not present

## 2022-01-21 DIAGNOSIS — I10 Essential (primary) hypertension: Secondary | ICD-10-CM

## 2022-01-21 NOTE — Progress Notes (Signed)
Leslie Douglas is a 57 y.o. female here for a follow up of a pre-existing problem.  History of Present Illness:   Chief Complaint  Patient presents with   Hypertension      HTN Currently taking losartan-hctz 50-12.5 mg. At home blood pressure readings are: <130/90. Patient denies chest pain, SOB, blurred vision, dizziness, unusual headaches, lower leg swelling. Patient is compliant with medication. Denies excessive caffeine intake, stimulant usage, excessive alcohol intake, or increase in salt consumption.  BP Readings from Last 3 Encounters:  01/21/22 130/84  12/30/21 120/70  10/20/21 (!) 126/90   Obesity Interested in weekly GLP-1's. She did NOT tolerate Saxenda but is agreeable to asking her GI doc if we could try a weekly GLP-1.  Past Medical History:  Diagnosis Date   Allergy    Anxiety    Back pain    Binge eating disorder    Borderline personality disorder (HCC)    COPD (chronic obstructive pulmonary disease) (HCC)    chronic bronchitis   Depression    Edema, lower extremity    Hip pain    History of chicken pox    Joint pain    Memory loss    Pancolitis (HCC)    Poor concentration    Seasonal allergies    Skin cancer, basal cell    basal cell   Tinnitus    Vitamin D deficiency      Social History   Tobacco Use   Smoking status: Never   Smokeless tobacco: Never  Vaping Use   Vaping Use: Never used  Substance Use Topics   Alcohol use: Not Currently    Comment: rarely   Drug use: Never    Past Surgical History:  Procedure Laterality Date   CHOLECYSTECTOMY  2003   MOHS SURGERY  10/2017   SKIN SURGERY  05/2020   nose skin cancer   VAGINAL DELIVERY  1995   WISDOM TOOTH EXTRACTION  12/2017    Family History  Problem Relation Age of Onset   Hypertension Mother    Diabetes Father    Colon cancer Neg Hx    Esophageal cancer Neg Hx    Rectal cancer Neg Hx    Stomach cancer Neg Hx    Pancreatic cancer Neg Hx    Inflammatory bowel disease  Neg Hx    Liver disease Neg Hx    Allergic rhinitis Neg Hx    Angioedema Neg Hx    Asthma Neg Hx    Eczema Neg Hx    Urticaria Neg Hx    Immunodeficiency Neg Hx    Atopy Neg Hx    Mental illness Neg Hx     Allergies  Allergen Reactions   Doxycycline Rash and Swelling    Mouth and face   Erythromycin Shortness Of Breath   Sulfa Antibiotics Shortness Of Breath   Emsam [Selegiline] Dermatitis    Allergic reaction to patch   Saxenda [Liraglutide -Weight Management] Nausea Only   Penicillins Rash    Current Medications:   Current Outpatient Medications:    ARIPiprazole (ABILIFY) 10 MG tablet, TAKE 1 TABLET BY MOUTH DAILY, Disp: 30 tablet, Rfl: 1   EPINEPHrine (EPIPEN 2-PAK) 0.3 mg/0.3 mL IJ SOAJ injection, Inject 0.3 mLs (0.3 mg total) into the muscle as needed for anaphylaxis., Disp: 1 each, Rfl: 1   fluticasone (FLONASE) 50 MCG/ACT nasal spray, Place 2 sprays into both nostrils daily as needed for allergies or rhinitis., Disp: 16 g, Rfl: 5   hydrOXYzine (  VISTARIL) 25 MG capsule, Take 1-2 capsules (25-50 mg total) by mouth at bedtime as needed. For sleep, Disp: 60 capsule, Rfl: 1   ipratropium (ATROVENT) 0.03 % nasal spray, Place 1 spray into both nostrils 4 (four) times daily. Use as directed., Disp: 30 mL, Rfl: 5   levocetirizine (XYZAL) 5 MG tablet, Take 1 tablet (5 mg total) by mouth every evening., Disp: 30 tablet, Rfl: 5   Lisdexamfetamine Dimesylate (VYVANSE) 10 MG CHEW, Chew 10 mg by mouth daily., Disp: 30 tablet, Rfl: 0   losartan-hydrochlorothiazide (HYZAAR) 50-12.5 MG tablet, Take 1 tablet by mouth daily., Disp: 90 tablet, Rfl: 0   mesalamine (LIALDA) 1.2 g EC tablet, TAKE FOUR TABLETS BY MOUTH DAILY WITH BREAKFAST, Disp: 120 tablet, Rfl: 11   propranolol (INDERAL) 10 MG tablet, Take 1 tablet (10 mg total) by mouth 3 (three) times daily as needed. For anxiety, Disp: 90 tablet, Rfl: 1   Vilazodone HCl (VIIBRYD) 40 MG TABS, TAKE 1 TABLET BY MOUTH DAILY, Disp: 30 tablet, Rfl:  1   Review of Systems:   ROS Negative unless otherwise specified per HPI.   Vitals:   Vitals:   01/21/22 0808  BP: 130/84  Pulse: 85  Temp: (!) 97.5 F (36.4 C)  TempSrc: Temporal  SpO2: 97%  Weight: 295 lb 8 oz (134 kg)  Height: '5\' 7"'$  (1.702 m)     Body mass index is 46.28 kg/m.  Physical Exam:   Physical Exam Vitals and nursing note reviewed.  Constitutional:      General: She is not in acute distress.    Appearance: She is well-developed. She is not ill-appearing or toxic-appearing.  Cardiovascular:     Rate and Rhythm: Normal rate and regular rhythm.     Pulses: Normal pulses.     Heart sounds: Normal heart sounds, S1 normal and S2 normal.  Pulmonary:     Effort: Pulmonary effort is normal.     Breath sounds: Normal breath sounds.  Skin:    General: Skin is warm and dry.  Neurological:     Mental Status: She is alert.     GCS: GCS eye subscore is 4. GCS verbal subscore is 5. GCS motor subscore is 6.  Psychiatric:        Speech: Speech normal.        Behavior: Behavior normal. Behavior is cooperative.     Assessment and Plan:   Primary hypertension At goal Continue losartan-hctz 50-12.5 mg I personally feel as though she could tolerate a vyvanse dose up to 40 mg with close monitoring of her blood pressure Follow-up in 6 months, sooner if concerns  Class 3 severe obesity with serious comorbidity and body mass index (BMI) of 45.0 to 49.9 in adult, unspecified obesity type [facility]) She will reach out to her GI doctor and let me know if he feels as though she could safely trial a GLP-1  Inda Coke, PA-C

## 2022-01-21 NOTE — Patient Instructions (Signed)
It was great to see you!  Message me if you want to pursue Eksir as we discussed  Also message your GI doctor about trialing Zepbound/Wegovy -- if they are agreeable, we can give it a shot!  Take care,  Inda Coke PA-C

## 2022-01-21 NOTE — Telephone Encounter (Signed)
Leslie Douglas, communicate this to the patient. I am okay with either option for you at this time. We will relay this information to your PCP as well. Thanks. GM  FYI SW

## 2022-01-22 ENCOUNTER — Ambulatory Visit (INDEPENDENT_AMBULATORY_CARE_PROVIDER_SITE_OTHER): Payer: No Typology Code available for payment source | Admitting: Psychiatry

## 2022-01-22 ENCOUNTER — Encounter: Payer: Self-pay | Admitting: Psychiatry

## 2022-01-22 VITALS — BP 145/82 | HR 99 | Temp 97.5°F | Ht 67.0 in | Wt 296.6 lb

## 2022-01-22 DIAGNOSIS — F331 Major depressive disorder, recurrent, moderate: Secondary | ICD-10-CM

## 2022-01-22 DIAGNOSIS — F603 Borderline personality disorder: Secondary | ICD-10-CM | POA: Diagnosis not present

## 2022-01-22 DIAGNOSIS — F50819 Binge eating disorder, unspecified: Secondary | ICD-10-CM

## 2022-01-22 DIAGNOSIS — F411 Generalized anxiety disorder: Secondary | ICD-10-CM

## 2022-01-22 DIAGNOSIS — F5081 Binge eating disorder: Secondary | ICD-10-CM

## 2022-01-22 DIAGNOSIS — G479 Sleep disorder, unspecified: Secondary | ICD-10-CM

## 2022-01-22 MED ORDER — SERTRALINE HCL 50 MG PO TABS: 50.0000 mg | ORAL_TABLET | Freq: Every day | ORAL | 1 refills | Status: DC

## 2022-01-22 MED ORDER — HYDROXYZINE PAMOATE 25 MG PO CAPS: 25.0000 mg | ORAL_CAPSULE | Freq: Every evening | ORAL | 1 refills | Status: AC | PRN

## 2022-01-22 MED ORDER — SERTRALINE HCL 25 MG PO TABS: 25.0000 mg | ORAL_TABLET | Freq: Every day | ORAL | 0 refills | Status: DC

## 2022-01-22 MED ORDER — VILAZODONE HCL 40 MG PO TABS: 20.0000 mg | ORAL_TABLET | Freq: Every day | ORAL | 0 refills | Status: DC

## 2022-01-22 NOTE — Patient Instructions (Signed)
Sertraline Tablets What is this medication? SERTRALINE (SER tra leen) treats depression, anxiety, obsessive-compulsive disorder (OCD), post-traumatic stress disorder (PTSD), and premenstrual dysphoric disorder (PMDD). It increases the amount of serotonin in the brain, a hormone that helps regulate mood. It belongs to a group of medications called SSRIs. This medicine may be used for other purposes; ask your health care provider or pharmacist if you have questions. COMMON BRAND NAME(S): Zoloft What should I tell my care team before I take this medication? They need to know if you have any of these conditions: Bleeding disorders Bipolar disorder or a family history of bipolar disorder Frequently drink alcohol Glaucoma Heart disease High blood pressure History of irregular heartbeat History of low levels of calcium, magnesium, or potassium in the blood Liver disease Receiving electroconvulsive therapy Seizures Suicidal thoughts, plans, or attempt by you or a family member Take medications that prevent or treat blood clots Thyroid disease An unusual or allergic reaction to sertraline, other medications, foods, dyes, or preservatives Pregnant or trying to get pregnant Breastfeeding How should I use this medication? Take this medication by mouth with a glass of water. Take it as directed on the prescription label at the same time every day. You can take it with or without food. If it upsets your stomach, take it with food. Do not take your medication more often than directed. Keep taking this medication unless your care team tells you to stop. Stopping it too quickly can cause serious side effects. It can also make your condition worse. A special MedGuide will be given to you by the pharmacist with each prescription and refill. Be sure to read this information carefully each time. Talk to your care team about the use of this medication in children. While it may be prescribed for children as  young as 7 years for selected conditions, precautions do apply. Overdosage: If you think you have taken too much of this medicine contact a poison control center or emergency room at once. NOTE: This medicine is only for you. Do not share this medicine with others. What if I miss a dose? If you miss a dose, take it as soon as you can. If it is almost time for your next dose, take only that dose. Do not take double or extra doses. What may interact with this medication? Do not take this medication with any of the following: Cisapride Dronedarone Linezolid MAOIs, such as Carbex, Eldepryl, Marplan, Nardil, and Parnate Methylene blue (injected into a vein) Pimozide Thioridazine This medication may also interact with the following: Alcohol Amphetamines Aspirin and aspirin-like medications Certain medications for fungal infections, such as ketoconazole, fluconazole, posaconazole, itraconazole Certain medications for irregular heart beat, such as flecainide, quinidine, propafenone Certain medications for mental health conditions Certain medications for migraine headaches, such as almotriptan, eletriptan, frovatriptan, naratriptan, rizatriptan, sumatriptan, zolmitriptan Certain medications for seizures, such as carbamazepine, valproic acid, phenytoin Certain medications for sleep Certain medications that prevent or treat blood clots, such as warfarin, enoxaparin, dalteparin Cimetidine Digoxin Diuretics Fentanyl Isoniazid Lithium NSAIDs, medications for pain and inflammation, such as ibuprofen or naproxen Other medications that cause heart rhythm changes, such as dofetilide Rasagiline Safinamide Supplements, such as St. John's wort, kava kava, valerian Tolbutamide Tramadol Tryptophan This list may not describe all possible interactions. Give your health care provider a list of all the medicines, herbs, non-prescription drugs, or dietary supplements you use. Also tell them if you smoke,  drink alcohol, or use illegal drugs. Some items may interact with your medicine.  What should I watch for while using this medication? Tell your care team if your symptoms do not get better or if they get worse. Visit your care team for regular checks on your progress. Because it may take several weeks to see the full effects of this medication, it is important to continue your treatment as prescribed by your care team. Patients and their families should watch out for new or worsening thoughts of suicide or depression. Also watch out for sudden changes in feelings such as feeling anxious, agitated, panicky, irritable, hostile, aggressive, impulsive, severely restless, overly excited and hyperactive, or not being able to sleep. If this happens, especially at the beginning of treatment or after a change in dose, call your care team. This medication may affect your coordination, reaction time, or judgment. Do not drive or operate machinery until you know how this medication affects you. Sit or stand up slowly to reduce the risk of dizzy or fainting spells. Drinking alcohol with this medication can increase the risk of these side effects. Your mouth may get dry. Chewing sugarless gum or sucking hard candy, and drinking plenty of water may help. Contact your care team if the problem does not go away or is severe. What side effects may I notice from receiving this medication? Side effects that you should report to your care team as soon as possible: Allergic reactions--skin rash, itching, hives, swelling of the face, lips, tongue, or throat Bleeding--bloody or black, tar-like stools, red or dark brown urine, vomiting blood or brown material that looks like coffee grounds, small red or purple spots on skin, unusual bleeding or bruising Heart rhythm changes--fast or irregular heartbeat, dizziness, feeling faint or lightheaded, chest pain, trouble breathing Low sodium level--muscle weakness, fatigue, dizziness,  headache, confusion Serotonin syndrome--irritability, confusion, fast or irregular heartbeat, muscle stiffness, twitching muscles, sweating, high fever, seizure, chills, vomiting, diarrhea Sudden eye pain or change in vision such as blurred vision, seeing halos around lights, vision loss Thoughts of suicide or self-harm, worsening mood Side effects that usually do not require medical attention (report these to your care team if they continue or are bothersome): Change in sex drive or performance Diarrhea Excessive sweating Nausea Tremors or shaking Upset stomach This list may not describe all possible side effects. Call your doctor for medical advice about side effects. You may report side effects to FDA at 1-800-FDA-1088. Where should I keep my medication? Keep out of the reach of children and pets. Store at room temperature between 20 and 25 degrees C (68 and 77 degrees F). Get rid of any unused medication after the expiration date. To get rid of medications that are no longer needed or expired: Take the medication to a medication take-back program. Check with your pharmacy or law enforcement to find a location. If you cannot return the medication, check the label or package insert to see if the medication should be thrown out in the garbage or flushed down the toilet. If you are not sure, ask your care team. If it is safe to put in the trash, empty the medication out of the container. Mix the medication with cat litter, dirt, coffee grounds, or other unwanted substance. Seal the mixture in a bag or container. Put it in the trash. NOTE: This sheet is a summary. It may not cover all possible information. If you have questions about this medicine, talk to your doctor, pharmacist, or health care provider.  2023 [employer]/Gold Standard (2021-07-28 00:00:00)

## 2022-01-22 NOTE — Progress Notes (Signed)
[facility] MD OP Progress Note  01/22/2022 12:59 PM Leslie Douglas  MRN:  [id number]  Chief Complaint:  Chief Complaint  Patient presents with   Follow-up   Anxiety   Depression   Medication Refill   HPI: Leslie Douglas is a 57 year old Caucasian female, employed, separated, lives in [place], has a history of MDD, GAD, binge eating disorder, borderline personality disorder was evaluated in office today.  Patient today reports she had a good holiday season.  Her son who goes to [facility] visited her.  She reports they had a good time and that helped.  Patient however reports she continues to struggle with depression symptoms.  She reports she struggles with self-care, hygiene, does not shower for days at a time, has sadness, low motivation, concentration problems.  Patient reports in spite of sleeping okay at night she continues to feel tired , and rested during the day.  She does snore at night.  Patient was advised to have sleep study completed last visit.  Patient reports she forgot to ask her primary care provider.  Discussed referral to pulmonologist, patient declines.  She will talk to primary care and let writer know.  Patient reports her blood pressure is currently more stable.  Yesterday she had it checked at her primary care provider office and it was within normal limits.  But today in session it is 145/82.  Patient reports she is slightly anxious being in the doctor's office.  She reports she has would like to hold the Vyvanse and is not interested in repeating the blood pressure today.  It makes her more anxious.  She agrees to monitor and follow up with primary care provider as needed.  Continues to be on losartan.  Denies any significant binging episodes on food at this time.  Patient currently denies any suicidality, homicidality or perceptual disturbances.  Patient is compliant on medications like Viibryd, Abilify.  Denies side effects.  Does not know if the Viibryd is  beneficial.  She reports even if she misses it for a few days she has not noticed a difference.  Interested in trial of sertraline.  Denies any other concerns today.  Visit Diagnosis:    ICD-10-CM   1. MDD (major depressive disorder), recurrent episode, moderate (HCC)  F33.1 Vilazodone HCl (VIIBRYD) 40 MG TABS    sertraline (ZOLOFT) 25 MG tablet    sertraline (ZOLOFT) 50 MG tablet    2. GAD (generalized anxiety disorder)  F41.1 hydrOXYzine (VISTARIL) 25 MG capsule    3. Binge eating disorder  F50.81     4. Borderline personality disorder ([place])  F60.3     5. Sleep disorder, unspecified  G47.9       Past Psychiatric History: Reviewed past psychiatric history from progress note on 05/19/2021.  Past Medical History:  Past Medical History:  Diagnosis Date   Allergy    Anxiety    Back pain    Binge eating disorder    Borderline personality disorder (HCC)    COPD (chronic obstructive pulmonary disease) (HCC)    chronic bronchitis   Depression    Edema, lower extremity    Hip pain    History of chicken pox    Joint pain    Memory loss    Pancolitis (HCC)    Poor concentration    Seasonal allergies    Skin cancer, basal cell    basal cell   Tinnitus    Vitamin D deficiency     Past Surgical  History:  Procedure Laterality Date   CHOLECYSTECTOMY  2003   [place] SURGERY  10/2017   SKIN SURGERY  05/2020   nose skin cancer   VAGINAL DELIVERY  1995   WISDOM TOOTH EXTRACTION  12/2017    Family Psychiatric History: Reviewed family psychiatric history from progress note on 05/19/2021.  Family History:  Family History  Problem Relation Age of Onset   Hypertension Mother    Diabetes Father    Colon cancer Neg Hx    Esophageal cancer Neg Hx    Rectal cancer Neg Hx    Stomach cancer Neg Hx    Pancreatic cancer Neg Hx    Inflammatory bowel disease Neg Hx    Liver disease Neg Hx    Allergic rhinitis Neg Hx    Angioedema Neg Hx    Asthma Neg Hx    Eczema Neg Hx    Urticaria  Neg Hx    Immunodeficiency Neg Hx    Atopy Neg Hx    Mental illness Neg Hx     Social History: Reviewed social history from progress note on 05/19/2021. Social History   Socioeconomic History   Marital status: Married    Spouse name: Not on file   Number of children: 3   Years of education: Not on file   Highest education level: Associate degree: academic program  Occupational History   Occupation: Aeronautical engineer    Comment: [employer]  Tobacco Use   Smoking status: Never   Smokeless tobacco: Never  Vaping Use   Vaping Use: Never used  Substance and Sexual Activity   Alcohol use: Not Currently    Comment: rarely   Drug use: Never   Sexual activity: Not Currently    Birth control/protection: None  Other Topics Concern   Not on file  Social History Narrative      Works for [employer], has Associate Degree   3 children   Right handed    Social Determinants of Health   Financial Resource Strain: Not on file  Food Insecurity: Not on file  Transportation Needs: Not on file  Physical Activity: Not on file  Stress: Not on file  Social Connections: Not on file    Allergies:  Allergies  Allergen Reactions   Doxycycline Rash and Swelling    Mouth and face   Erythromycin Shortness Of Breath   Sulfa Antibiotics Shortness Of Breath   Emsam [Selegiline] Dermatitis    Allergic reaction to patch   Saxenda [Liraglutide -Weight Management] Nausea Only   Penicillins Rash    Metabolic Disorder Labs: Lab Results  Component Value Date   HGBA1C 5.4 05/27/2021   Lab Results  Component Value Date   PROLACTIN 3.6 (L) 05/27/2021   Lab Results  Component Value Date   CHOL 225 (H) 03/31/2021   TRIG 154.0 (H) 03/31/2021   HDL 54.10 03/31/2021   CHOLHDL 4 03/31/2021   VLDL 30.8 03/31/2021   LDLCALC 140 (H) 03/31/2021   LDLCALC 153 (H) 10/31/2019   Lab Results  Component Value Date   TSH 2.000 05/27/2021   TSH 2.32 03/27/2019    Therapeutic Level Labs: No  results found for: "LITHIUM" No results found for: "VALPROATE" No results found for: "CBMZ"  Current Medications: Current Outpatient Medications  Medication Sig Dispense Refill   ARIPiprazole (ABILIFY) 10 MG tablet TAKE 1 TABLET BY MOUTH DAILY 30 tablet 1   EPINEPHrine (EPIPEN 2-PAK) 0.3 mg/0.3 mL IJ SOAJ injection Inject 0.3 mLs (0.3 mg total) into the  muscle as needed for anaphylaxis. 1 each 1   fluticasone (FLONASE) 50 MCG/ACT nasal spray Place 2 sprays into both nostrils daily as needed for allergies or rhinitis. 16 g 5   ipratropium (ATROVENT) 0.03 % nasal spray Place 1 spray into both nostrils 4 (four) times daily. Use as directed. 30 mL 5   levocetirizine (XYZAL) 5 MG tablet Take 1 tablet (5 mg total) by mouth every evening. 30 tablet 5   losartan-hydrochlorothiazide (HYZAAR) 50-12.5 MG tablet Take 1 tablet by mouth daily. 90 tablet 0   mesalamine (LIALDA) 1.2 g EC tablet TAKE FOUR TABLETS BY MOUTH DAILY WITH BREAKFAST 120 tablet 11   propranolol (INDERAL) 10 MG tablet Take 1 tablet (10 mg total) by mouth 3 (three) times daily as needed. For anxiety 90 tablet 1   sertraline (ZOLOFT) 25 MG tablet Take 1 tablet (25 mg total) by mouth daily with breakfast for 15 days. 15 tablet 0   [START ON 02/06/2022] sertraline (ZOLOFT) 50 MG tablet Take 1 tablet (50 mg total) by mouth daily with breakfast. Start taking after stopping sertraline 25 mg after 15 days, dose increase 30 tablet 1   hydrOXYzine (VISTARIL) 25 MG capsule Take 1-2 capsules (25-50 mg total) by mouth at bedtime as needed. For sleep 60 capsule 1   Vilazodone HCl (VIIBRYD) 40 MG TABS Take 0.5 tablets (20 mg total) by mouth daily. 4 tablet 0   No current facility-administered medications for this visit.     Musculoskeletal: Strength & Muscle Tone: within normal limits Gait & Station: normal Patient leans: N/A  Psychiatric Specialty Exam: Review of Systems  Psychiatric/Behavioral:  Positive for decreased concentration,  dysphoric mood and sleep disturbance. The patient is nervous/anxious.   All other systems reviewed and are negative.   Blood pressure (!) 145/82, pulse 99, temperature (!) 97.5 F (36.4 C), temperature source Oral, height '5\' 7"'$  (1.702 m), weight 296 lb 9.6 oz (134.5 kg), SpO2 95 %.Body mass index is 46.45 kg/m.  General Appearance: Casual  Eye Contact:  Fair  Speech:  Clear and Coherent  Volume:  Normal  Mood:  Anxious and Depressed  Affect:  Congruent  Thought Process:  Goal Directed and Descriptions of Associations: Intact  Orientation:  Full (Time, Place, and Person)  Thought Content: Logical   Suicidal Thoughts:  No  Homicidal Thoughts:  No  Memory:  Immediate;   Fair Recent;   Fair Remote;   Fair  Judgement:  Fair  Insight:  Fair  Psychomotor Activity:  Normal  Concentration:  Concentration: Fair and Attention Span: Fair  Recall:  [employer] of Knowledge: Fair  Language: Fair  Akathisia:  No  Handed:  Right  AIMS (if indicated): done  Assets:  Communication Skills Desire for Improvement Physical Health Resilience Talents/Skills  ADL's:  Intact  Cognition: WNL  Sleep:   ok , but feels unrested   Screenings: [place] Office Visit from 11/06/2021 in [facility] Office Visit from 10/01/2021 in [facility] Office Visit from 07/17/2021 in [facility] Visit from 05/19/2021 in [facility] Total Score 0 0 0 [place] Visit from 01/22/2022 in [facility] Visit from 11/06/2021 in [place] Visit from 10/01/2021 in [place] Video Visit from 09/03/2021 in [place] Office Visit from 06/18/2021 in [facility]  Total GAD-7 Score '3 4 9 9 13      '$ PHQ2-9     [place] Visit from 01/22/2022 in [facility] Office Visit from 11/06/2021 in [place] Office Visit from 10/01/2021 in [place] [facility] Visit from 09/03/2021 in [place] Office Visit from 07/17/2021 in [place]  PHQ-2 Total Score '6 2 3 4 6  '$ PHQ-9 Total Score '13 8 10 11 15      '$ [place] Office Visit from 01/22/2022 in [place] Office Visit from 11/06/2021 in [place] Video Visit from 09/03/2021 in [place] No Risk No Risk No Risk        Assessment and Plan: IVONNA KINNICK is a 57 year old Caucasian female, employed, lives in [place], separated, has a history of depression, binge eating disorder, anxiety, multiple medical problems was evaluated in office today.  Patient continues to struggle with depression symptoms, fatigue, excessive sleepiness, snoring, will benefit from the following plan.  Plan  MDD-unstable Taper off Viibryd, advised to take 20 mg p.o. daily for 3 to 4 days and stop taking it Start sertraline 25 mg p.o. daily with breakfast for 15 days, increase to sertraline 50 mg p.o. daily after that Continue Abilify 10 mg p.o. daily Hydroxyzine 25 mg p.o. nightly as needed Continue CBT  GAD-improving Propranolol 10 mg p.o. 3 times daily as needed Continue CBT/DBT  Binge eating disorder-stable Continue to hold Vyvanse due to elevated blood pressure reading Continue CBT  Borderline personality disorder-improving Continue CBT with [facility].  Sleep disorder unspecified-unstable Patient may need sleep study, discussed referral, patient would like to discuss with primary care provider.  Follow-up in clinic in 4 weeks or sooner if needed.   This note was generated in part or whole  with voice recognition software. Voice recognition is usually quite accurate but there are transcription errors that can and very often do occur. I apologize for any typographical errors that were not detected and corrected.    Leslie Alert, MD 01/22/2022, 12:59 PM

## 2022-01-24 ENCOUNTER — Other Ambulatory Visit: Payer: Self-pay | Admitting: Physician Assistant

## 2022-01-25 NOTE — Telephone Encounter (Signed)
Patient has been informed that Dr. Rush Landmark would like for her to have liver ultrasound. Pt aware that she will be contacted by [facility] Scheduling. If she has not heard from them within 2 days she will contact them at [phone].

## 2022-02-01 ENCOUNTER — Other Ambulatory Visit: Payer: Self-pay | Admitting: Physician Assistant

## 2022-02-01 ENCOUNTER — Encounter: Payer: Self-pay | Admitting: Physician Assistant

## 2022-02-01 DIAGNOSIS — G479 Sleep disorder, unspecified: Secondary | ICD-10-CM

## 2022-02-01 DIAGNOSIS — R29818 Other symptoms and signs involving the nervous system: Secondary | ICD-10-CM

## 2022-02-03 ENCOUNTER — Ambulatory Visit (HOSPITAL_COMMUNITY)
Admission: RE | Admit: 2022-02-03 | Discharge: 2022-02-03 | Disposition: A | Discharge status: Home / Self Care | Payer: No Typology Code available for payment source | Source: Ambulatory Visit | Attending: Gastroenterology | Admitting: Gastroenterology

## 2022-02-03 DIAGNOSIS — R7989 Other specified abnormal findings of blood chemistry: Secondary | ICD-10-CM | POA: Insufficient documentation

## 2022-02-03 DIAGNOSIS — K760 Fatty (change of) liver, not elsewhere classified: Secondary | ICD-10-CM | POA: Diagnosis not present

## 2022-02-08 DIAGNOSIS — M76822 Posterior tibial tendinitis, left leg: Secondary | ICD-10-CM | POA: Insufficient documentation

## 2022-02-08 DIAGNOSIS — M79672 Pain in left foot: Secondary | ICD-10-CM | POA: Insufficient documentation

## 2022-02-15 ENCOUNTER — Other Ambulatory Visit: Payer: No Typology Code available for payment source

## 2022-02-15 DIAGNOSIS — K52832 Lymphocytic colitis: Secondary | ICD-10-CM

## 2022-02-15 DIAGNOSIS — K529 Noninfective gastroenteritis and colitis, unspecified: Secondary | ICD-10-CM

## 2022-02-15 DIAGNOSIS — R7989 Other specified abnormal findings of blood chemistry: Secondary | ICD-10-CM

## 2022-02-18 LAB — CALPROTECTIN, FECAL: Calprotectin, Fecal: 7 ug/g (ref 0–120)

## 2022-02-24 ENCOUNTER — Ambulatory Visit: Payer: No Typology Code available for payment source

## 2022-03-04 ENCOUNTER — Encounter: Payer: Self-pay | Admitting: Psychiatry

## 2022-03-04 ENCOUNTER — Ambulatory Visit (INDEPENDENT_AMBULATORY_CARE_PROVIDER_SITE_OTHER): Payer: No Typology Code available for payment source | Admitting: Psychiatry

## 2022-03-04 VITALS — BP 111/80 | HR 97 | Temp 97.6°F | Ht 67.0 in | Wt 296.8 lb

## 2022-03-04 DIAGNOSIS — F603 Borderline personality disorder: Secondary | ICD-10-CM

## 2022-03-04 DIAGNOSIS — F331 Major depressive disorder, recurrent, moderate: Secondary | ICD-10-CM

## 2022-03-04 DIAGNOSIS — F5081 Binge eating disorder: Secondary | ICD-10-CM | POA: Diagnosis not present

## 2022-03-04 DIAGNOSIS — G479 Sleep disorder, unspecified: Secondary | ICD-10-CM

## 2022-03-04 DIAGNOSIS — F411 Generalized anxiety disorder: Secondary | ICD-10-CM | POA: Diagnosis not present

## 2022-03-04 MED ORDER — SERTRALINE HCL 100 MG PO TABS: 100.0000 mg | ORAL_TABLET | Freq: Every day | ORAL | 0 refills | Status: DC

## 2022-03-04 MED ORDER — ARIPIPRAZOLE 10 MG PO TABS: 10.0000 mg | ORAL_TABLET | Freq: Every day | ORAL | 0 refills | Status: DC

## 2022-03-04 NOTE — Progress Notes (Signed)
[facility] MD OP Progress Note  03/04/2022 10:59 AM Leslie Douglas  MRN:  [id number]  Chief Complaint:  Chief Complaint  Patient presents with   Follow-up   Anxiety   Depression   Medication Refill   HPI: Leslie Douglas is a 57 year old Caucasian female, employed, separated, lives in [place], has a history of MDD, GAD, binge eating disorder, borderline personality disorder was evaluated in office today.  Patient today reports she continues to have pain of her left foot, from tendinitis.  Completed a course of prednisone.  While she was on the prednisone she had increased appetite as well as mood swings.  Now that she is off of the prednisone she seems to feel better although she continues to overeat.  Her weight however continues to remain stable.  Patient reports she was taper herself off of the Viibryd.  Did not have any withdrawal symptoms.  Tolerated it well.  Patient reports she likes the effect of sertraline.  She has not noticed any side effects.  Mood does seem to be better to some extent.  Agreeable to dosage increase.  Does report she feels she sleeps okay at night.  However she has trouble getting out of bed in the morning.  She is scheduled for a sleep study soon.  Patient continues to follow-up with her therapist on a regular basis.  Patient denies any suicidality, homicidality or perceptual disturbances.  Patient denies any other concerns today.  Visit Diagnosis:    ICD-10-CM   1. MDD (major depressive disorder), recurrent episode, moderate (HCC)  F33.1 sertraline (ZOLOFT) 100 MG tablet    2. GAD (generalized anxiety disorder)  F41.1 sertraline (ZOLOFT) 100 MG tablet    3. Binge eating disorder  F50.81     4. Borderline personality disorder ([place])  F60.3 ARIPiprazole (ABILIFY) 10 MG tablet    5. Sleep disorder, unspecified  G47.9       Past Psychiatric History: Reviewed past psychiatric history from progress note on 05/19/2021.  Past Medical History:  Past Medical  History:  Diagnosis Date   Allergy    Anxiety    Back pain    Binge eating disorder    Borderline personality disorder ([place])    COPD (chronic obstructive pulmonary disease) (HCC)    chronic bronchitis   Depression    Edema, lower extremity    Hip pain    History of chicken pox    Joint pain    Memory loss    Pancolitis (HCC)    Poor concentration    Seasonal allergies    Skin cancer, basal cell    basal cell   Tinnitus    Vitamin D deficiency     Past Surgical History:  Procedure Laterality Date   CHOLECYSTECTOMY  2003   MOHS SURGERY  10/2017   SKIN SURGERY  05/2020   nose skin cancer   [place] EXTRACTION  12/2017    Family Psychiatric History: Reviewed family psychiatric history from progress note on 05/19/2021.  Family History:  Family History  Problem Relation Age of Onset   Hypertension Mother    Diabetes Father    Colon cancer Neg Hx    Esophageal cancer Neg Hx    Rectal cancer Neg Hx    Stomach cancer Neg Hx    Pancreatic cancer Neg Hx    Inflammatory bowel disease Neg Hx    Liver disease Neg Hx    Allergic rhinitis Neg Hx  Angioedema Neg Hx    Asthma Neg Hx    Eczema Neg Hx    Urticaria Neg Hx    Immunodeficiency Neg Hx    Atopy Neg Hx    Mental illness Neg Hx     Social History: Reviewed social history from progress note on 05/19/2021. Social History   Socioeconomic History   Marital status: Married    Spouse name: Not on file   Number of children: 3   Years of education: Not on file   Highest education level: Associate degree: academic program  Occupational History   Occupation: Aeronautical engineer    Comment: [employer]  Tobacco Use   Smoking status: Never   Smokeless tobacco: Never  Vaping Use   Vaping Use: Never used  Substance and Sexual Activity   Alcohol use: Not Currently    Comment: rarely   Drug use: Never   Sexual activity: Not Currently    Birth control/protection: None  Other Topics Concern    Not on file  Social History Narrative      Works for [employer], has Associate Degree   3 children   Right handed    Social Determinants of Health   Financial Resource Strain: Not on file  Food Insecurity: Not on file  Transportation Needs: Not on file  Physical Activity: Not on file  Stress: Not on file  Social Connections: Not on file    Allergies:  Allergies  Allergen Reactions   Doxycycline Rash and Swelling    Mouth and face   Erythromycin Shortness Of Breath   Sulfa Antibiotics Shortness Of Breath   Emsam [Selegiline] Dermatitis    Allergic reaction to patch   Saxenda [Liraglutide -Weight Management] Nausea Only   Penicillins Rash    Metabolic Disorder Labs: Lab Results  Component Value Date   HGBA1C 5.4 05/27/2021   Lab Results  Component Value Date   PROLACTIN 3.6 (L) 05/27/2021   Lab Results  Component Value Date   CHOL 225 (H) 03/31/2021   TRIG 154.0 (H) 03/31/2021   HDL 54.10 03/31/2021   CHOLHDL 4 03/31/2021   VLDL 30.8 03/31/2021   LDLCALC 140 (H) 03/31/2021   LDLCALC 153 (H) 10/31/2019   Lab Results  Component Value Date   TSH 2.000 05/27/2021   TSH 2.32 03/27/2019    Therapeutic Level Labs: No results found for: "LITHIUM" No results found for: "VALPROATE" No results found for: "CBMZ"  Current Medications: Current Outpatient Medications  Medication Sig Dispense Refill   EPINEPHrine (EPIPEN 2-PAK) 0.3 mg/0.3 mL IJ SOAJ injection Inject 0.3 mLs (0.3 mg total) into the muscle as needed for anaphylaxis. 1 each 1   fluticasone (FLONASE) 50 MCG/ACT nasal spray Place 2 sprays into both nostrils daily as needed for allergies or rhinitis. 16 g 5   hydrOXYzine (VISTARIL) 25 MG capsule Take 1-2 capsules (25-50 mg total) by mouth at bedtime as needed. For sleep 60 capsule 1   ipratropium (ATROVENT) 0.03 % nasal spray Place 1 spray into both nostrils 4 (four) times daily. Use as directed. 30 mL 5   levocetirizine (XYZAL) 5 MG tablet Take 1  tablet (5 mg total) by mouth every evening. 30 tablet 5   losartan-hydrochlorothiazide (HYZAAR) 50-12.5 MG tablet TAKE 1 TABLET BY MOUTH DAILY 90 tablet 0   mesalamine (LIALDA) 1.2 g EC tablet TAKE FOUR TABLETS BY MOUTH DAILY WITH BREAKFAST 120 tablet 11   propranolol (INDERAL) 10 MG tablet Take 1 tablet (10 mg total) by mouth 3 (three)  times daily as needed. For anxiety 90 tablet 1   sertraline (ZOLOFT) 100 MG tablet Take 1 tablet (100 mg total) by mouth daily with breakfast. 90 tablet 0   ARIPiprazole (ABILIFY) 10 MG tablet Take 1 tablet (10 mg total) by mouth daily. 90 tablet 0   No current facility-administered medications for this visit.     Musculoskeletal: Strength & Muscle Tone: within normal limits Gait & Station: normal Patient leans: N/A  Psychiatric Specialty Exam: Review of Systems  Constitutional:  Positive for fatigue.  Musculoskeletal:        Left foot pain-tendinitis  Psychiatric/Behavioral:  Positive for dysphoric mood and sleep disturbance. The patient is nervous/anxious.   All other systems reviewed and are negative.   Blood pressure 111/80, pulse 97, temperature 97.6 F (36.4 C), temperature source Skin, height '5\' 7"'$  (1.702 m), weight 296 lb 12.8 oz (134.6 kg).Body mass index is 46.49 kg/m.  General Appearance: Casual  Eye Contact:  Fair  Speech:  Clear and Coherent  Volume:  Normal  Mood:  Anxious and Depressed  Affect:  Appropriate  Thought Process:  Goal Directed and Descriptions of Associations: Intact  Orientation:  Full (Time, Place, and Person)  Thought Content: Logical   Suicidal Thoughts:  No  Homicidal Thoughts:  No  Memory:  Immediate;   Fair Recent;   Fair Remote;   Fair  Judgement:  Fair  Insight:  Fair  Psychomotor Activity:  Normal  Concentration:  Concentration: Fair and Attention Span: Fair  Recall:  [employer] of Knowledge: Fair  Language: Fair  Akathisia:  No  Handed:  Right  AIMS (if indicated): done  Assets:  Communication  Skills Desire for Improvement Housing Social Support  ADL's:  Intact  Cognition: WNL  Sleep:   Excessive   Screenings: [facility], [employer] [facility] Office Visit from 03/04/2022 in [place] Office Visit from 11/06/2021 in [place] Office Visit from 10/01/2021 in [place] Office Visit from 07/17/2021 in [place] Office Visit from 05/19/2021 in [place] Total Score 0 0 0 0 0      [place] Office Visit from 03/04/2022 in [place] Office Visit from 01/22/2022 in [place] Office Visit from 11/06/2021 in [place] Office Visit from 10/01/2021 in [place] Video Visit from 09/03/2021 in [place]  Total GAD-7 Score '3 3 4 9 9      '$ PHQ2-9    [place] Office Visit from 03/04/2022 in [place] Office Visit from 01/22/2022 in [place] Office Visit from 11/06/2021 in [place] Office Visit from 10/01/2021 in [place] Video Visit from 09/03/2021 in [place]  PHQ-2 Total Score '3 6 2 3 4  '$ PHQ-9 Total Score '12 13 8 10 11      '$ [place] Office Visit from 03/04/2022 in [place] Office Visit from 01/22/2022 in [place] Office Visit from 11/06/2021 in [place] No Risk No Risk No Risk        Assessment  and Plan: Louann  ANIBAL ZINGALE is a 57 year old Caucasian female, employed, lives in [place], separated, has a history of depression, binge eating disorder, multiple medical problems was evaluated in office today.  Patient is currently tolerating the Zoloft well, will benefit from dosage increase, will benefit from following plan.  Plan  MDD-unstable Increase Zoloft to 100 mg p.o. daily with breakfast Continue Abilify 10 mg p.o. daily Hydroxyzine 25 mg p.o. nightly as needed Continue CBT  GAD-improving Propranolol 10 mg p.o. 3 times daily as needed Continue CBT/DBT  Binge eating disorder-stable Continue to hold Vyvanse due to elevated blood pressure on the Vyvanse. Continue CBT  Borderline personality disorder-improving Continue DBT/CBT with [facility]  Sleep disorders unspecified-unstable Patient has upcoming sleep study.  Follow-up in clinic in 8 weeks or sooner if needed.  This note was generated in part or whole with voice recognition software. Voice recognition is usually quite accurate but there are transcription errors that can and very often do occur. I apologize for any typographical errors that were not detected and corrected.  This note was generated in part or whole with voice recognition software. Voice recognition is usually quite accurate but there are transcription errors that can and very often do occur. I apologize for any typographical errors that were not detected and corrected.      Ursula Alert, MD 03/05/2022, 7:26 AM

## 2022-03-08 ENCOUNTER — Encounter: Payer: Self-pay | Admitting: Neurology

## 2022-03-08 ENCOUNTER — Ambulatory Visit: Payer: No Typology Code available for payment source | Admitting: Neurology

## 2022-03-08 VITALS — BP 111/83 | HR 98 | Ht 67.0 in | Wt 297.6 lb

## 2022-03-08 DIAGNOSIS — F39 Unspecified mood [affective] disorder: Secondary | ICD-10-CM

## 2022-03-08 DIAGNOSIS — Z9189 Other specified personal risk factors, not elsewhere classified: Secondary | ICD-10-CM | POA: Diagnosis not present

## 2022-03-08 DIAGNOSIS — G4719 Other hypersomnia: Secondary | ICD-10-CM

## 2022-03-08 DIAGNOSIS — R0683 Snoring: Secondary | ICD-10-CM

## 2022-03-08 DIAGNOSIS — Z6841 Body Mass Index (BMI) 40.0 and over, adult: Secondary | ICD-10-CM

## 2022-03-08 NOTE — Patient Instructions (Signed)

## 2022-03-08 NOTE — Progress Notes (Signed)
Subjective:    Patient ID: Leslie Douglas is a 57 y.o. female.  HPI    Star Age, MD, PhD [facility] [address]O. [place], [place] [address]  Dear Aldona Bar,  I saw your patient, Leslie Douglas, upon your kind request in my sleep clinic today for initial consultation of her sleep disorder, in particular, concern for underlying obstructive sleep apnea.  The patient is unaccompanied today.  As you know, Leslie Douglas is a 57 year old female with an underlying medical history of hypertension, left foot tendinitis, vitamin D deficiency, allergies, anxiety, depression, back pain, COPD, tinnitus, and severe obesity with a BMI of over 45, who reports snoring and excessive daytime somnolence.  Her Epworth sleepiness score is 6 out of 24, fatigue severity score is 47 out of 63.  I reviewed your office note from 01/21/2022. She is tired during the day, does not always wake up rested.  She currently lives alone, she is separated.  She has 3 grown sons, her twins live in [place] and her youngest son is at [facility].  She works for [employer] in the office and also some from home.  She is a non-smoker and does not drink alcohol, she limits her caffeine to 1 cup of coffee per day and 1 soda per day.  Bedtime is generally around 930 but she is on her phone for a little bit and then turns it off.  She does not actually have a TV in her bedroom.  She has 2 cats in the household who generally sleep in her bedroom and sometimes on the bed.  Rise time is around 6 or 6:30 AM.  She does not have night to night nocturia or recurrent nocturnal or morning headaches.  She is not aware of any family history of sleep apnea.  She was encouraged by her psychiatrist to seek sleep testing.  She recently had medication changes, she is no longer on Viibryd and was started on sertraline, recently increased to 100 mg daily.  She is also on Abilify 10 mg in the mornings.  Weight  has been more or less stable in the past year but she has slowly gained weight over time in the past.  She also has a eating disorder and when she was treated for tendinitis with prednisone for about 10 days, her appetite increased and her binge eating increased she reports.  She is a stomach and side sleeper, she would be concerned about being able to use a CPAP machine because of her sleep positions but generally speaking would be willing to try PAP therapy if the need arises.  Her Past Medical History Is Significant For: Past Medical History:  Diagnosis Date   Allergy    Anxiety    Back pain    Binge eating disorder    Borderline personality disorder (HCC)    COPD (chronic obstructive pulmonary disease) (HCC)    chronic bronchitis   Depression    Edema, lower extremity    Hip pain    History of chicken pox    Joint pain    Memory loss    Pancolitis (HCC)    Poor concentration    Seasonal allergies    Skin cancer, basal cell    basal cell   Tinnitus    Vitamin D deficiency     Her Past Surgical History Is Significant For: Past Surgical History:  Procedure Laterality Date   CHOLECYSTECTOMY  2003   MOHS SURGERY  10/2017   SKIN SURGERY  05/2020   nose skin cancer   VAGINAL DELIVERY  1995   WISDOM TOOTH EXTRACTION  12/2017    Her Family History Is Significant For: Family History  Problem Relation Age of Onset   Hypertension Mother    Diabetes Father    Colon cancer Neg Hx    Esophageal cancer Neg Hx    Rectal cancer Neg Hx    Stomach cancer Neg Hx    Pancreatic cancer Neg Hx    Inflammatory bowel disease Neg Hx    Liver disease Neg Hx    Allergic rhinitis Neg Hx    Angioedema Neg Hx    Asthma Neg Hx    Eczema Neg Hx    Urticaria Neg Hx    Immunodeficiency Neg Hx    Atopy Neg Hx    Mental illness Neg Hx    Sleep apnea Neg Hx     Her Social History Is Significant For: Social History   Socioeconomic History   Marital status: Married    Spouse name: Not on  file   Number of children: 3   Years of education: Not on file   Highest education level: Associate degree: academic program  Occupational History   Occupation: Aeronautical engineer    Comment: [employer]  Tobacco Use   Smoking status: Never   Smokeless tobacco: Never  Vaping Use   Vaping Use: Never used  Substance and Sexual Activity   Alcohol use: Not Currently    Comment: rarely   Drug use: Never   Sexual activity: Not Currently    Birth control/protection: None  Other Topics Concern   Not on file  Social History Narrative      Works for [employer], has Designer, industrial/product   3 children   Right handed    Social Determinants of Radio broadcast assistant Strain: Not on file  Food Insecurity: Not on file  Transportation Needs: Not on file  Physical Activity: Not on file  Stress: Not on file  Social Connections: Not on file    Her Allergies Are:  Allergies  Allergen Reactions   Doxycycline Rash and Swelling    Mouth and face   Erythromycin Shortness Of Breath   Sulfa Antibiotics Shortness Of Breath   Emsam [Selegiline] Dermatitis    Allergic reaction to patch   Saxenda [Liraglutide -Weight Management] Nausea Only   Penicillins Rash  :   Her Current Medications Are:  Outpatient Encounter Medications as of 03/08/2022  Medication Sig   ARIPiprazole (ABILIFY) 10 MG tablet Take 1 tablet (10 mg total) by mouth daily.   EPINEPHrine (EPIPEN 2-PAK) 0.3 mg/0.3 mL IJ SOAJ injection Inject 0.3 mLs (0.3 mg total) into the muscle as needed for anaphylaxis.   fluticasone (FLONASE) 50 MCG/ACT nasal spray Place 2 sprays into both nostrils daily as needed for allergies or rhinitis.   hydrOXYzine (VISTARIL) 25 MG capsule Take 1-2 capsules (25-50 mg total) by mouth at bedtime as needed. For sleep   ipratropium (ATROVENT) 0.03 % nasal spray Place 1 spray into both nostrils 4 (four) times daily. Use as directed.   levocetirizine (XYZAL) 5 MG tablet Take 1 tablet (5 mg total) by  mouth every evening.   losartan-hydrochlorothiazide (HYZAAR) 50-12.5 MG tablet TAKE 1 TABLET BY MOUTH DAILY   mesalamine (LIALDA) 1.2 g EC tablet TAKE FOUR TABLETS BY MOUTH DAILY WITH BREAKFAST   propranolol (INDERAL) 10 MG tablet Take 1 tablet (10 mg total) by mouth 3 (  three) times daily as needed. For anxiety   sertraline (ZOLOFT) 100 MG tablet Take 1 tablet (100 mg total) by mouth daily with breakfast.   No facility-administered encounter medications on file as of 03/08/2022.  :   Review of Systems:  Out of a complete 14 point review of systems, all are reviewed and negative with the exception of these symptoms as listed below:   Review of Systems  Neurological:        Pt here for sleep consult Pt snore,hypertension,fatigue  Pt denies sleep study,CPAP machine , headaches       ESS:6 FSS:47    Objective:  Neurological Exam  Physical Exam Physical Examination:   Vitals:   03/08/22 1459  BP: 111/83  Pulse: 98    General Examination: The patient is a very pleasant 57 y.o. female in no acute distress. She appears well-developed and well-nourished and well groomed.   HEENT: Normocephalic, atraumatic, pupils are equal, round and reactive to light, extraocular tracking is good without limitation to gaze excursion or nystagmus noted. Hearing is grossly intact. Face is symmetric with normal facial animation. Speech is clear with no dysarthria noted. There is no hypophonia. There is no lip, neck/head, jaw or voice tremor. Neck is supple with full range of passive and active motion. There are no carotid bruits on auscultation. Oropharynx exam reveals: mild mouth dryness, adequate dental hygiene and moderate airway crowding, due to Mallampati class IV, small airway entry, uvula tip and tonsils not fully visualized, tongue normal in size, neck circumference of 17 5/8 inches.  Minimal overbite.  Tongue protrudes centrally.  Chest: Clear to auscultation without wheezing, rhonchi or  crackles noted.  Heart: S1+S2+0, regular and normal without murmurs, rubs or gallops noted.   Abdomen: Soft, non-tender and non-distended.  Extremities: There is nonpitting puffiness around the left ankle, this is different with a tendinitis, no edema in the right distal lower extremity.   Skin: Warm and dry without trophic changes noted.   Musculoskeletal: exam reveals no obvious joint deformities.   Neurologically:  Mental status: The patient is awake, alert and oriented in all 4 spheres. Her immediate and remote memory, attention, language skills and fund of knowledge are appropriate. There is no evidence of aphasia, agnosia, apraxia or anomia. Speech is clear with normal prosody and enunciation. Thought process is linear. Mood is normal and affect is normal.  Cranial nerves II - XII are as described above under HEENT exam.  Motor exam: Normal bulk, strength and tone is noted. There is no obvious action or resting tremor.  Fine motor skills and coordination: grossly intact.  Cerebellar testing: No dysmetria or intention tremor. There is no truncal or gait ataxia.  Sensory exam: intact to light touch in the upper and lower extremities.  Gait, station and balance: She stands up slowly, does not require any help, she does not use a walking aid.  She has a mild limp.  Assessment and Plan:  In summary, Aloha K Curatola is a very pleasant 57 y.o.-year old female with an underlying medical history of hypertension, left foot tendinitis, vitamin D deficiency, allergies, anxiety, depression, back pain, COPD, tinnitus, and severe obesity with a BMI of over 45, whose history and physical exam are concerning for sleep disordered breathing, particularly obstructive sleep apnea (OSA).   I had a long chat with the patient about my findings and the diagnosis of sleep apnea, particularly OSA, its prognosis and treatment options. We talked about medical/conservative treatments, surgical interventions and  non-pharmacological approaches for symptom control. I explained, in particular, the risks and ramifications of untreated moderate to severe OSA, especially with respect to developing cardiovascular disease down the road, including congestive heart failure (CHF), difficult to treat hypertension, cardiac arrhythmias (particularly A-fib), neurovascular complications including TIA, stroke and dementia. Even type 2 diabetes has, in part, been linked to untreated OSA. Symptoms of untreated OSA may include (but may not be limited to) daytime sleepiness, nocturia (i.e. frequent nighttime urination), memory problems, mood irritability and suboptimally controlled or worsening mood disorder such as depression and/or anxiety, lack of energy, lack of motivation, physical discomfort, as well as recurrent headaches, especially morning or nocturnal headaches. We talked about the importance of maintaining a healthy lifestyle and striving for healthy weight. In addition, we talked about the importance of striving for and maintaining good sleep hygiene. I recommended a sleep study at this time. I outlined the differences between a laboratory attended sleep study which is considered more comprehensive and accurate over the option of a home sleep test (HST); the latter may lead to underestimation of sleep disordered breathing in some instances and does not help with diagnosing upper airway resistance syndrome and is not accurate enough to diagnose primary central sleep apnea typically. I outlined possible surgical and non-surgical treatment options of OSA, including the use of a positive airway pressure (PAP) device (i.e. CPAP, AutoPAP/APAP or BiPAP in certain circumstances), a custom-made dental device (aka oral appliance, which would require a referral to a specialist dentist or orthodontist typically, and is generally speaking not considered for patients with full dentures or edentulous state), upper airway surgical options, such  as traditional UPPP (which is not considered a first-line treatment) or the Inspire device (hypoglossal nerve stimulator, which would involve a referral for consultation with an ENT surgeon, after careful selection, following inclusion criteria - also not first-line treatment). I explained the PAP treatment option to the patient in detail, as this is generally considered first-line treatment.  The patient indicated that she would be willing to try PAP therapy, if the need arises. I explained the importance of being compliant with PAP treatment, not only for insurance purposes but primarily to improve patient's symptoms symptoms, and for the patient's long term health benefit, including to reduce Her cardiovascular risks longer-term.    We will pick up our discussion about the next steps and treatment options after testing.  We will keep her posted as to the test results by phone call and/or MyChart messaging where possible.  We will plan to follow-up in sleep clinic accordingly as well.  I answered all her questions today and the patient was in agreement.   I encouraged her to call with any interim questions, concerns, problems or updates or email [name] through [place].  Generally speaking, sleep test authorizations may take up to 2 weeks, sometimes less, sometimes longer, the patient is encouraged to get in touch with [name] if they do not hear back from the sleep lab staff directly within the next 2 weeks.  Thank you very much for allowing me to participate in the care of this nice patient. If I can be of any further assistance to you please do not hesitate to call me at [phone].  Sincerely,   Star Age, MD, PhD

## 2022-03-29 ENCOUNTER — Ambulatory Visit
Admission: RE | Admit: 2022-03-29 | Discharge: 2022-03-29 | Disposition: A | Discharge status: Home / Self Care | Payer: No Typology Code available for payment source | Source: Ambulatory Visit | Attending: Physician Assistant | Admitting: Physician Assistant

## 2022-03-29 DIAGNOSIS — Z1231 Encounter for screening mammogram for malignant neoplasm of breast: Secondary | ICD-10-CM

## 2022-03-31 ENCOUNTER — Ambulatory Visit: Payer: No Typology Code available for payment source | Admitting: Neurology

## 2022-03-31 DIAGNOSIS — G4719 Other hypersomnia: Secondary | ICD-10-CM

## 2022-03-31 DIAGNOSIS — F39 Unspecified mood [affective] disorder: Secondary | ICD-10-CM

## 2022-03-31 DIAGNOSIS — Z9189 Other specified personal risk factors, not elsewhere classified: Secondary | ICD-10-CM

## 2022-03-31 DIAGNOSIS — G4733 Obstructive sleep apnea (adult) (pediatric): Secondary | ICD-10-CM

## 2022-03-31 DIAGNOSIS — R0683 Snoring: Secondary | ICD-10-CM

## 2022-04-15 NOTE — Procedures (Signed)
   [facility]  HOME SLEEP TEST (Watch PAT) REPORT - Mail-out Device  STUDY DATE: 04/10/22  DOB: 05/10/1965  MRN: [id number]  ORDERING CLINICIAN: Star Age, MD, PhD   REFERRING CLINICIAN: Inda Coke, [place]   CLINICAL INFORMATION/HISTORY: 57 year old female with an underlying medical history of hypertension, left foot tendinitis, vitamin D deficiency, allergies, anxiety, depression, back pain, COPD, tinnitus, and severe obesity with a BMI of over 45, who reports snoring and excessive daytime somnolence.   Epworth sleepiness score: 6/24.  BMI: 46.7 kg/m  FINDINGS:   Sleep Summary:   Total Recording Time (hours, min): 8 hours, 4 min  Total Sleep Time (hours, min):  7 hours, 36 min  Percent REM (%):    20.7%   Respiratory Indices:   Calculated pAHI (per hour):  53.2/hour         REM pAHI:    36.3/hour       NREM pAHI: 57.7/hour  Central pAHI: 2.9/hour  Oxygen Saturation Statistics:    Oxygen Saturation (%) Mean: 93%   Minimum oxygen saturation (%):                 84%   O2 Saturation Range (%): 84 - 99%    O2 Saturation (minutes) <=88%: 0.9 min  Pulse Rate Statistics:   Pulse Mean (bpm):    68/min    Pulse Range (47 - 95/min)   IMPRESSION: OSA (obstructive sleep apnea), severe   RECOMMENDATION:  This home sleep test demonstrates severe obstructive sleep apnea with a total AHI of 53.2/hour and O2 nadir of 84%. Snoring was detected, in the moderate to loud range, at times milder.  Treatment with positive airway pressure is highly recommended. The patient will be advised to proceed with an autoPAP titration/trial at home. A laboratory attended titration study can be considered in the future for optimization of treatment settings and to improve tolerance and compliance. Alternative treatment options are limited secondary to the severity of the patient's sleep disordered breathing, but may include surgical treatment with an implantable  hypoglossal nerve stimulator (in carefully selected candidates, meeting criteria).  Concomitant weight loss is recommended, where clinically appropriate. Please note, that untreated obstructive sleep apnea may carry additional perioperative morbidity. Patients with significant obstructive sleep apnea should receive perioperative PAP therapy and the surgeons and particularly the anesthesiologist should be informed of the diagnosis and the severity of the sleep disordered breathing. The patient should be cautioned not to drive, work at heights, or operate dangerous or heavy equipment when tired or sleepy. Review and reiteration of good sleep hygiene measures should be pursued with any patient. Other causes of the patient's symptoms, including circadian rhythm disturbances, an underlying mood disorder, medication effect and/or an underlying medical problem cannot be ruled out based on this test. Clinical correlation is recommended.  The patient and her referring provider will be notified of the test results. The patient will be seen in follow up in sleep clinic at [facility].  I certify that I have reviewed the raw data recording prior to the issuance of this report in accordance with the standards of the American Academy of Sleep Medicine (AASM).  INTERPRETING PHYSICIAN:   Star Age, MD, PhD Medical Director, [facility]) [place], ABPN (Neurology and Sleep)   [facility] [address], [place], [place] [address] [phone]

## 2022-04-15 NOTE — Addendum Note (Signed)
Addended by: Star Age on: 04/15/2022 04:55 PM   Modules accepted: Orders

## 2022-04-21 ENCOUNTER — Telehealth: Payer: Self-pay

## 2022-04-21 NOTE — Telephone Encounter (Signed)
Called pt scheduled her initial auto-PAP appointment with Megan on 8/21 @ 11 am.

## 2022-04-21 NOTE — Telephone Encounter (Signed)
-----   Message from Huston Foley, MD sent at 04/15/2022  4:55 PM EDT ----- Patient referred by PCP, seen by me on 03/08/22, patient had a HST on 04/10/22.    Please call and notify the patient that the recent home sleep test showed obstructive sleep apnea in the severe range. I recommend treatment for this in the form of autoPAP, which means, that we don't have to bring her in for a sleep study with CPAP, but will let her start using a so called autoPAP machine at home, through a DME company (of her choice, or as per insurance requirement). The DME representative will fit the patient with a mask of choice, educate her on how to use the machine, how to put the mask on, etc. I have placed an order in the chart. Please send the order to a local DME, talk to patient, send report to referring MD. Please also reinforce the need for compliance with treatment. We will need a FU in sleep clinic for 10 weeks post-PAP set up, please arrange that with me or one of our NPs. Thanks,   Huston Foley, MD, PhD [facility])

## 2022-04-21 NOTE — Telephone Encounter (Signed)
I called pt. I advised pt that Dr. Frances Furbish reviewed their sleep study results and found that pt has severe osa. Dr. Frances Furbish recommends that pt start an auto-PAP at home. I reviewed PAP compliance expectations with the pt. Pt is agreeable to starting an auto-PAP. I advised pt that an order will be sent to a DME, [facility], and [facility] will call the pt within about one week after they file with the pt's insurance. [facility] will show the pt how to use the machine, fit for masks, and troubleshoot the auto-PAP if needed.  Pt verbalized understanding of results. Pt had no questions at this time but was encouraged to call back if questions arise. I have sent the order to [facility] and have received confirmation that they have received the order.  Patient will need a 31-90 day follow up after starting her auto-PAP. This will likely be late June-August. Please call to schedule.

## 2022-04-21 NOTE — Telephone Encounter (Signed)
Depending on patient's auto-pap set up date, this appointment will likely be too late. We can wait until the set up confirmation fax comes through. Just FYI.

## 2022-04-29 ENCOUNTER — Encounter: Payer: Self-pay | Admitting: Psychiatry

## 2022-04-29 ENCOUNTER — Telehealth: Payer: No Typology Code available for payment source | Admitting: Psychiatry

## 2022-04-29 DIAGNOSIS — F411 Generalized anxiety disorder: Secondary | ICD-10-CM

## 2022-04-29 DIAGNOSIS — F331 Major depressive disorder, recurrent, moderate: Secondary | ICD-10-CM | POA: Diagnosis not present

## 2022-04-29 DIAGNOSIS — F5081 Binge eating disorder: Secondary | ICD-10-CM | POA: Diagnosis not present

## 2022-04-29 DIAGNOSIS — F603 Borderline personality disorder: Secondary | ICD-10-CM

## 2022-04-29 DIAGNOSIS — Z9189 Other specified personal risk factors, not elsewhere classified: Secondary | ICD-10-CM

## 2022-04-29 DIAGNOSIS — G4733 Obstructive sleep apnea (adult) (pediatric): Secondary | ICD-10-CM

## 2022-04-29 NOTE — Patient Instructions (Signed)
Please call for EKG - [phone] ? ? ?Cariprazine Capsules ?What is this medication? ?CARIPRAZINE (car i PRA zeen) treats schizophrenia and bipolar disorder. It may also be used with antidepressant medication to treat depression. It works by balancing the levels of dopamine and serotonin in your brain, substances that help regulate mood, behaviors, and thoughts. It belongs to a group of medications called antipsychotics. Antipsychotic medications can be used to treat several kinds of mental health conditions. ?This medicine may be used for other purposes; ask your health care provider or pharmacist if you have questions. ?COMMON BRAND NAME(S): VRAYLAR ?What should I tell my care team before I take this medication? ?They need to know if you have any of these conditions: ?Dementia ?Diabetes ?Difficulty swallowing ?Have trouble controlling your muscles ?Heart disease ?High cholesterol ?History of breast cancer ?History of stroke ?Kidney disease ?Liver disease ?Low blood counts, like low white cell, platelet, or red cell counts ?Low blood pressure ?Parkinson's disease ?Seizures ?Suicidal thoughts, plans or attempt; a previous suicide attempt by you or a family member ?An unusual or allergic reaction to cariprazine, other medications, foods, dyes, or preservatives ?Pregnant or trying to get pregnant ?Breast-feeding ?How should I use this medication? ?Take this medication by mouth with a glass of water. Follow the directions on the prescription label. You may take it with or without food. Take your medication at regular intervals. Do not take it more often than directed. Do not stop taking except on your care team's advice. ?A special MedGuide will be given to you by the pharmacist with each prescription and refill. Be sure to read this information carefully each time. ?Talk to your care team about the use of this medication in children. Special care may be needed. ?Overdosage: If you think you have taken too much of  this medicine contact a poison control center or emergency room at once. ?NOTE: This medicine is only for you. Do not share this medicine with others. ?What if I miss a dose? ?If you miss a dose, take it as soon as you can. If it is almost time for your next dose, take only that dose. Do not take double or extra doses. ?What may interact with this medication? ?Do not take this medication with any of the following: ?Metoclopramide ?This medication may also interact with the following: ?Antihistamines for allergy, cough, and cold ?Carbamazepine ?Certain medications for anxiety or sleep ?Certain medications for depression like amitriptyline, fluoxetine, sertraline ?Certain medications for fungal infections like itraconazole, ketoconazole ?General anesthetics like halothane, isoflurane, methoxyflurane, propofol ?Levodopa or other medications for Parkinson's disease ?Medications for blood pressure ?Medications for seizures ?Medications that relax muscles for surgery ?Narcotic medications for pain ?Phenothiazines like chlorpromazine, prochlorperazine, thioridazine ?Rifampin ?This list may not describe all possible interactions. Give your health care provider a list of all the medicines, herbs, non-prescription drugs, or dietary supplements you use. Also tell them if you smoke, drink alcohol, or use illegal drugs. Some items may interact with your medicine. ?What should I watch for while using this medication? ?Visit your care team for regular checks on your progress. Tell your care team if symptoms do not start to get better or if they get worse. Do not stop taking except on your care team's advice. You may develop a severe reaction. Your care team will tell you how much medication to take. ?Patients and their families should watch out for new or worsening depression or thoughts of suicide. Also watch out for sudden changes in feelings such   as feeling anxious, agitated, panicky, irritable, hostile, aggressive, impulsive,  severely restless, overly excited and hyperactive, or not being able to sleep. If this happens, especially at the beginning of treatment or after a change in dose, call your care team. ?You may get dizzy or drowsy. Do not drive, use machinery, or do anything that needs mental alertness until you know how this medication affects you. Do not stand or sit up quickly, especially if you are an older patient. This reduces the risk of dizzy or fainting spells. Alcohol may interfere with the effect of this medication. Avoid alcoholic drinks. ?This medication may cause dry eyes and blurred vision. If you wear contact lenses you may feel some discomfort. Lubricating drops may help. See your eye doctor if the problem does not go away or is severe. ?This medication may increase blood sugar. Ask your care team if changes in diet or medications are needed if you have diabetes. ?This medication can cause problems with controlling your body temperature. It can lower the response of your body to cold temperatures. If possible, stay indoors during cold weather. If you must go outdoors, wear warm clothes. It can also lower the response of your body to heat. Do not overheat. Do not over-exercise. Stay out of the sun when possible. If you must be in the sun, wear cool clothing. Drink plenty of water. If you have trouble controlling your body temperature, call your care team right away. ?Women should inform their care team if they wish to become pregnant or think they might be pregnant. The effects of this medication on an unborn child are not known. A registry is available to monitor pregnancy outcomes in pregnant women exposed to this medication or similar medications. Talk to your care team or pharmacist for more information. ?What side effects may I notice from receiving this medication? ?Side effects that you should report to your care team as soon as possible: ?Allergic reactions--skin rash, itching, hives, swelling of the face,  lips, tongue, or throat ?High blood sugar (hyperglycemia)--increased thirst or amount of urine, unusual weakness or fatigue, blurry vision ?High fever, stiff muscles, increased sweating, fast or irregular heartbeat, and confusion, which may be signs of neuroleptic malignant syndrome ?Infection--fever, chills, cough, or sore throat ?Low blood pressure--dizziness, feeling faint or lightheaded, blurry vision ?Pain or trouble swallowing ?Seizures ?Stroke--sudden numbness or weakness of the face, arm, or leg, trouble speaking, confusion, trouble walking, loss of balance or coordination, dizziness, severe headache, change in vision ?Thoughts of suicide or self-harm, worsening mood, feelings of depression ?Uncontrolled and repetitive body movements, muscle stiffness or spasms, tremors or shaking, loss of balance or coordination, restlessness, shuffling walk, which may be signs of extrapyramidal symptoms (EPS) ?Side effects that usually do not require medical attention (report to your care team if they continue or are bothersome): ?Constipation ?Dizziness ?Drowsiness ?Nausea ?Trouble sleeping ?Upset stomach ?Vomiting ?This list may not describe all possible side effects. Call your doctor for medical advice about side effects. You may report side effects to FDA at 1-800-FDA-1088. ?Where should I keep my medication? ?Keep out of the reach of children. ?Store at room temperature between 15 and 30 degrees C (59 and 86 degrees F). Protect from light. Throw away any unused medication after the expiration date. ?NOTE: This sheet is a summary. It may not cover all possible information. If you have questions about this medicine, talk to your doctor, pharmacist, or health care provider. ?? 2023 Elsevier/Gold Standard (2021-01-15 00:00:00) ? ?

## 2022-04-29 NOTE — Progress Notes (Signed)
Virtual Visit via Video Note  I connected with Leslie Douglas on 04/29/22 at  1:30 PM EDT by a video enabled telemedicine application and verified that I am speaking with the correct person using two identifiers.  Location Provider Location : [facility] Patient Location : Home  Participants: Patient , Provider    I discussed the limitations of evaluation and management by telemedicine and the availability of in person appointments. The patient expressed understanding and agreed to proceed.   I discussed the assessment and treatment plan with the patient. The patient was provided an opportunity to ask questions and all were answered. The patient agreed with the plan and demonstrated an understanding of the instructions.   The patient was advised to call back or seek an in-person evaluation if the symptoms worsen or if the condition fails to improve as anticipated.   [facility] MD OP Progress Note  04/30/2022 12:38 PM Leslie Douglas  MRN:  [id number]  Chief Complaint:  Chief Complaint  Patient presents with   Follow-up   Medication Refill   Depression   Anxiety   HPI: Leslie Douglas is a 57 year old Caucasian female, employed, separated, lives in [place], has a history of MDD, GAD, binge eating disorder, borderline personality disorder was evaluated by telemedicine today.  Patient today reports she continues to feel depressed, struggles with low motivation, inability to enjoy her day-to-day life, has been lacking in self hygiene, has not been taking shower on a regular basis.  Patient however reports she has been in a lot of pain.  She continues to have tendinitis.  She completed a course of prednisone and that also did not completely help.  The pain does have an impact on her mood and she has been unable to do any activities due to the pain.  That does affect her mood.  Patient recently had sleep study completed per report.  Reports she was diagnosed with obstructive sleep apnea.   Currently awaiting CPAP device.  Sleep apnea does have an impact on her energy level, focus and concentration.  Patient does report passive thoughts of not wanting to be here however denies any active suicidality.  Denies any homicidality or perceptual disturbances.  Patient is currently compliant on the sertraline, Abilify.  She reports she has not noticed much benefit from this combination of medication.  Agreeable to change the Abilify to Vraylar.  However will need an EKG completed.  Patient agrees to get it done.  Patient continues to be compliant with psychotherapy with [facility].    Visit Diagnosis:    ICD-10-CM   1. MDD (major depressive disorder), recurrent episode, moderate  F33.1     2. GAD (generalized anxiety disorder)  F41.1     3. Binge eating disorder  F50.81     4. Borderline personality disorder  F60.3     5. OSA (obstructive sleep apnea)  G47.33     6. At risk for prolonged QT interval syndrome  Z91.89 EKG 12-Lead      Past Psychiatric History: I have reviewed past psychiatric history from progress note on 05/19/2021.  Past Medical History:  Past Medical History:  Diagnosis Date   Allergy    Anxiety    Back pain    Binge eating disorder    Borderline personality disorder    COPD (chronic obstructive pulmonary disease)    chronic bronchitis   Depression    Edema, lower extremity    Hip pain    History of chicken pox  Joint pain    Memory loss    Pancolitis    Poor concentration    Seasonal allergies    Skin cancer, basal cell    basal cell   Tinnitus    Vitamin D deficiency     Past Surgical History:  Procedure Laterality Date   CHOLECYSTECTOMY  2003   MOHS SURGERY  10/2017   SKIN SURGERY  05/2020   nose skin cancer   VAGINAL DELIVERY  1995   WISDOM TOOTH EXTRACTION  12/2017    Family Psychiatric History: Reviewed family psychiatric history from progress note on 05/19/2021.  Family History:  Family History  Problem Relation Age  of Onset   Hypertension Mother    Diabetes Father    Colon cancer Neg Hx    Esophageal cancer Neg Hx    Rectal cancer Neg Hx    Stomach cancer Neg Hx    Pancreatic cancer Neg Hx    Inflammatory bowel disease Neg Hx    Liver disease Neg Hx    Allergic rhinitis Neg Hx    Angioedema Neg Hx    Asthma Neg Hx    Eczema Neg Hx    Urticaria Neg Hx    Immunodeficiency Neg Hx    Atopy Neg Hx    Mental illness Neg Hx    Sleep apnea Neg Hx     Social History: Reviewed social history from progress note on 05/19/2021. Social History   Socioeconomic History   Marital status: Married    Spouse name: Not on file   Number of children: 3   Years of education: Not on file   Highest education level: Associate degree: academic program  Occupational History   Occupation: Aeronautical engineer    Comment: [employer]  Tobacco Use   Smoking status: Never   Smokeless tobacco: Never  Vaping Use   Vaping Use: Never used  Substance and Sexual Activity   Alcohol use: Not Currently    Comment: rarely   Drug use: Never   Sexual activity: Not Currently    Birth control/protection: None  Other Topics Concern   Not on file  Social History Narrative      Works for [employer], has Associate Degree   3 children   Right handed    Social Determinants of Health   Financial Resource Strain: Not on file  Food Insecurity: Not on file  Transportation Needs: Not on file  Physical Activity: Not on file  Stress: Not on file  Social Connections: Not on file    Allergies:  Allergies  Allergen Reactions   Doxycycline Rash and Swelling    Mouth and face   Erythromycin Shortness Of Breath   Sulfa Antibiotics Shortness Of Breath   Emsam [Selegiline] Dermatitis    Allergic reaction to patch   Saxenda [Liraglutide -Weight Management] Nausea Only   Penicillins Rash    Metabolic Disorder Labs: Lab Results  Component Value Date   HGBA1C 5.4 05/27/2021   Lab Results  Component Value Date    PROLACTIN 3.6 (L) 05/27/2021   Lab Results  Component Value Date   CHOL 225 (H) 03/31/2021   TRIG 154.0 (H) 03/31/2021   HDL 54.10 03/31/2021   CHOLHDL 4 03/31/2021   VLDL 30.8 03/31/2021   LDLCALC 140 (H) 03/31/2021   LDLCALC 153 (H) 10/31/2019   Lab Results  Component Value Date   TSH 2.000 05/27/2021   TSH 2.32 03/27/2019    Therapeutic Level Labs: No results found for: "LITHIUM" No  results found for: "VALPROATE" No results found for: "CBMZ"  Current Medications: Current Outpatient Medications  Medication Sig Dispense Refill   ARIPiprazole (ABILIFY) 10 MG tablet Take 1 tablet (10 mg total) by mouth daily. 90 tablet 0   EPINEPHrine (EPIPEN 2-PAK) 0.3 mg/0.3 mL IJ SOAJ injection Inject 0.3 mLs (0.3 mg total) into the muscle as needed for anaphylaxis. 1 each 1   fluticasone (FLONASE) 50 MCG/ACT nasal spray Place 2 sprays into both nostrils daily as needed for allergies or rhinitis. 16 g 5   hydrOXYzine (VISTARIL) 25 MG capsule Take 1-2 capsules (25-50 mg total) by mouth at bedtime as needed. For sleep 60 capsule 1   ipratropium (ATROVENT) 0.03 % nasal spray Place 1 spray into both nostrils 4 (four) times daily. Use as directed. 30 mL 5   levocetirizine (XYZAL) 5 MG tablet Take 1 tablet (5 mg total) by mouth every evening. 30 tablet 5   losartan-hydrochlorothiazide (HYZAAR) 50-12.5 MG tablet TAKE 1 TABLET BY MOUTH DAILY 90 tablet 0   mesalamine (LIALDA) 1.2 g EC tablet TAKE FOUR TABLETS BY MOUTH DAILY WITH BREAKFAST 120 tablet 11   propranolol (INDERAL) 10 MG tablet Take 1 tablet (10 mg total) by mouth 3 (three) times daily as needed. For anxiety 90 tablet 1   sertraline (ZOLOFT) 100 MG tablet Take 1 tablet (100 mg total) by mouth daily with breakfast. 90 tablet 0   No current facility-administered medications for this visit.     Musculoskeletal: Strength & Muscle Tone:  UTA Gait & Station:  Seated Patient leans: N/A  Psychiatric Specialty Exam: Review of Systems   Constitutional:  Positive for fatigue.  Musculoskeletal:        Foot pain  Psychiatric/Behavioral:  Positive for decreased concentration, dysphoric mood and sleep disturbance. The patient is nervous/anxious.   All other systems reviewed and are negative.   There were no vitals taken for this visit.There is no height or weight on file to calculate BMI.  General Appearance: Casual  Eye Contact:  Good  Speech:  Clear and Coherent  Volume:  Normal  Mood:  Anxious, Depressed, and Dysphoric  Affect:  Depressed  Thought Process:  Goal Directed and Descriptions of Associations: Intact  Orientation:  Full (Time, Place, and Person)  Thought Content: Logical   Suicidal Thoughts:  No  Homicidal Thoughts:  No  Memory:  Immediate;   Fair Recent;   Fair Remote;   Fair  Judgement:  Fair  Insight:  Fair  Psychomotor Activity:  Normal  Concentration:  Concentration: Fair and Attention Span: Fair  Recall:  [employer] of Knowledge: Fair  Language: Fair  Akathisia:  No  Handed:  Right  AIMS (if indicated): not done  Assets:  Communication Skills Desire for Improvement Housing Social Support Talents/Skills Transportation  ADL's:  Intact  Cognition: WNL  Sleep:  Poor   Screenings: Geneticist, molecular Office Visit from 03/04/2022 in [facility] Office Visit from 11/06/2021 in [facility] Office Visit from 10/01/2021 in [facility] Office Visit from 07/17/2021 in [facility] Office Visit from 05/19/2021 in [facility]  AIMS Total Score 0 0 0 0 0      GAD-7    Flowsheet Row Office Visit from 03/04/2022 in [facility] Office Visit from 01/22/2022 in [facility] Office Visit from 11/06/2021 in  [facility] Office Visit from 10/01/2021 in [facility] Video Visit from 09/03/2021 in [facility]  Total GAD-7 Score PHQ2-9    Flowsheet Row Office Visit from 03/04/2022 in [facility] Office Visit from 01/22/2022 in [facility] Office Visit from 11/06/2021 in [facility] Office Visit from 10/01/2021 in [facility] Video Visit from 09/03/2021 in [facility]  PHQ-2 Total Score PHQ-9 Total Score Flowsheet Row Video Visit from 04/29/2022 in [facility] Office Visit from 03/04/2022 in [facility] Office Visit from 01/22/2022 in [facility]  C-SSRS RISK CATEGORY Low Risk No Risk No Risk        Assessment and Plan: Leslie Douglas is a 57 year old Caucasian female, employed, lives in [place], separated, has a history of depression, binge eating disorder, multiple medical problems including chronic foot pain, obstructive sleep apnea currently not on CPAP, continues to struggle with mood symptoms, hygiene and self-care as well as focus and concentration.  Patient will benefit from the following plan.  Plan MDD-unstable Continue Zoloft 100 mg p.o. daily with breakfast Will consider changing Abilify to Vraylar however will get an EKG first. Hydroxyzine 25 mg p.o. nightly as needed Continue CBT with [facility].  GAD-unstable Propranolol 10 mg p.o. 3 times daily as needed Zoloft as prescribed Continue CBT/DBT  Binge eating disorder-improving Continue CBT. Currently not on Vyvanse due to elevated blood pressure on  Vyvanse.  Borderline personality disorder-unstable Continue DBT/CBT with [facility]  Obstructive sleep apnea-unstable Patient awaiting CPAP. Encouraged compliance, provided education. I have reviewed notes per Dr.Saima Athar dated 03/31/2022-patient was diagnosed with sleep apnea.  Recommended CPAP correction.  At risk for prolonged QT syndrome-we will order EKG-patient to call [phone].  Patient's mood symptoms likely due to her chronic pain, uncorrected sleep apnea, she will benefit from management of her pain as well as sleep along with medication changes as noted.  Follow-up in clinic in 2 to 3 weeks or sooner in person.  Collaboration of Care: Collaboration of Care: Referral or follow-up with counselor/therapist AEB patient encouraged to continue CBT.  Patient/Guardian was advised Release of Information must be obtained prior to any record release in order to collaborate their care with an outside provider. Patient/Guardian was advised if they have not already done so to contact the registration department to sign all necessary forms in order for [name] to release information regarding their care.   Consent: Patient/Guardian gives verbal consent for treatment and assignment of benefits for services provided during this visit. Patient/Guardian expressed understanding and agreed to proceed.   This note was generated in part or whole with voice recognition software. Voice recognition is usually quite accurate but there are transcription errors that can and very often do occur. I apologize for any typographical errors that were not detected and corrected.    Jomarie Longs, MD 04/30/2022, 12:38 PM

## 2022-05-06 ENCOUNTER — Ambulatory Visit
Admission: RE | Admit: 2022-05-06 | Discharge: 2022-05-06 | Disposition: A | Discharge status: Home / Self Care | Payer: No Typology Code available for payment source | Source: Ambulatory Visit | Attending: Psychiatry | Admitting: Psychiatry

## 2022-05-06 DIAGNOSIS — Z9189 Other specified personal risk factors, not elsewhere classified: Secondary | ICD-10-CM | POA: Insufficient documentation

## 2022-05-07 ENCOUNTER — Telehealth: Payer: Self-pay | Admitting: Psychiatry

## 2022-05-07 DIAGNOSIS — F603 Borderline personality disorder: Secondary | ICD-10-CM

## 2022-05-07 DIAGNOSIS — F331 Major depressive disorder, recurrent, moderate: Secondary | ICD-10-CM

## 2022-05-07 MED ORDER — CARIPRAZINE HCL 1.5 MG PO CAPS: 1.5000 mg | ORAL_CAPSULE | Freq: Every day | ORAL | 1 refills | Status: DC

## 2022-05-07 NOTE — Telephone Encounter (Signed)
Contacted patient to discuss EKG report 05/06/2022-normal sinus rhythm, QTc 459 Left fascicular block now present.  Patient currently denies any chest pain or shortness of breath or other symptoms.  However agrees to discuss with primary care provider also.  Okay to stop Abilify and start Vraylar.  Patient provided instructions to do so.

## 2022-05-10 ENCOUNTER — Other Ambulatory Visit: Payer: Self-pay | Admitting: Physician Assistant

## 2022-05-18 ENCOUNTER — Telehealth: Payer: Self-pay | Admitting: Neurology

## 2022-05-18 NOTE — Telephone Encounter (Signed)
Pt was scheduled for her initial CPAP on (08/03/22) Pt was informed to bring machine and power cord to the appointment.   DME and between dates are:06/09/22-08/09/22 in pt's [employer].

## 2022-05-18 NOTE — Telephone Encounter (Signed)
Great - thanks

## 2022-05-31 ENCOUNTER — Other Ambulatory Visit: Payer: Self-pay

## 2022-05-31 ENCOUNTER — Telehealth: Payer: Self-pay

## 2022-05-31 DIAGNOSIS — R7989 Other specified abnormal findings of blood chemistry: Secondary | ICD-10-CM

## 2022-05-31 NOTE — Telephone Encounter (Signed)
Spoke to patient , patient aware to come in for labs. Labs oredered

## 2022-05-31 NOTE — Telephone Encounter (Signed)
-----   Message from Loretha Stapler, RN sent at 12/30/2021  4:54 PM EST ----- hepatic function panel lab for 4 to 6 months.

## 2022-06-01 ENCOUNTER — Ambulatory Visit (INDEPENDENT_AMBULATORY_CARE_PROVIDER_SITE_OTHER): Payer: No Typology Code available for payment source | Admitting: Psychiatry

## 2022-06-01 ENCOUNTER — Encounter: Payer: Self-pay | Admitting: Psychiatry

## 2022-06-01 VITALS — BP 138/83 | HR 86 | Temp 97.5°F | Ht 67.0 in | Wt 301.2 lb

## 2022-06-01 DIAGNOSIS — F603 Borderline personality disorder: Secondary | ICD-10-CM

## 2022-06-01 DIAGNOSIS — F5081 Binge eating disorder: Secondary | ICD-10-CM | POA: Diagnosis not present

## 2022-06-01 DIAGNOSIS — F411 Generalized anxiety disorder: Secondary | ICD-10-CM

## 2022-06-01 DIAGNOSIS — G4733 Obstructive sleep apnea (adult) (pediatric): Secondary | ICD-10-CM

## 2022-06-01 DIAGNOSIS — F3342 Major depressive disorder, recurrent, in full remission: Secondary | ICD-10-CM

## 2022-06-01 MED ORDER — CARIPRAZINE HCL 1.5 MG PO CAPS: 1.5000 mg | ORAL_CAPSULE | Freq: Every day | ORAL | 0 refills | Status: DC

## 2022-06-01 MED ORDER — SERTRALINE HCL 100 MG PO TABS: 100.0000 mg | ORAL_TABLET | Freq: Every day | ORAL | 0 refills | Status: DC

## 2022-06-01 NOTE — Progress Notes (Signed)
BH MD OP Progress Note  06/01/2022 2:56 PM Leslie SHORKEY  MRN:  [id number]  Chief Complaint:  Chief Complaint  Patient presents with   Follow-up   Anxiety   Depression   Medication Refill   HPI: Leslie Douglas is a 57 year old Caucasian female, employed, separated, lives in [place], has a history of MDD, GAD, binge eating disorder, borderline personality disorder was evaluated in office today.  Patient today reports she feels much better compared to how she was doing previously.  She is tolerating the Vraylar 1.5 mg well.  She was able to stop the Abilify and did not have any worsening mood symptoms.  Continues to be compliant on Zoloft.  Denies side effects.  Patient reports overall mood symptoms as better.  Denies any significant sadness, mood swings, anxiety symptoms.  She reports she is currently able to manage her binge eating episodes.  However due to inactivity she may have gained up to 4 pounds since her last visit.  Patient currently on cam walker boots,left foot.  Reports she is currently using lidocaine patch which helps.  She is also planning to start physical therapy soon.  Patient reports sleep as overall improving.  Patient reports she has started using CPAP for her OSA.  Denies any suicidality, homicidality or perceptual disturbances.  Continues to be compliant with psychotherapy.  Reports therapy sessions are beneficial.  Reports she looks forward to spending some time with her son who is going to visit from [facility].  She is also currently taking some time off from work and that also seems to be helpful.  Visit Diagnosis:    ICD-10-CM   1. MDD (major depressive disorder), recurrent, in full remission (HCC)  F33.42 sertraline (ZOLOFT) 100 MG tablet    cariprazine (VRAYLAR) 1.5 MG capsule    2. GAD (generalized anxiety disorder)  F41.1 sertraline (ZOLOFT) 100 MG tablet    3. Binge eating disorder  F50.81     4. Borderline personality disorder (HCC)   F60.3     5. OSA (obstructive sleep apnea)  G47.33       Past Psychiatric History: I have reviewed past psychiatric history from progress note on 05/19/2021.  Past Medical History:  Past Medical History:  Diagnosis Date   Allergy    Anxiety    Back pain    Binge eating disorder    Borderline personality disorder (HCC)    COPD (chronic obstructive pulmonary disease) (HCC)    chronic bronchitis   Depression    Edema, lower extremity    Hip pain    History of chicken pox    Joint pain    Memory loss    Pancolitis (HCC)    Poor concentration    Seasonal allergies    Skin cancer, basal cell    basal cell   Tinnitus    Vitamin D deficiency     Past Surgical History:  Procedure Laterality Date   CHOLECYSTECTOMY  2003   MOHS SURGERY  10/2017   SKIN SURGERY  05/2020   nose skin cancer   VAGINAL DELIVERY  1995   WISDOM TOOTH EXTRACTION  12/2017    Family Psychiatric History: I have reviewed family psychiatric history from progress note on 05/19/2021.  Family History:  Family History  Problem Relation Age of Onset   Hypertension Mother    Diabetes Father    Colon cancer Neg Hx    Esophageal cancer Neg Hx    Rectal cancer Neg Hx  Stomach cancer Neg Hx    Pancreatic cancer Neg Hx    Inflammatory bowel disease Neg Hx    Liver disease Neg Hx    Allergic rhinitis Neg Hx    Angioedema Neg Hx    Asthma Neg Hx    Eczema Neg Hx    Urticaria Neg Hx    Immunodeficiency Neg Hx    Atopy Neg Hx    Mental illness Neg Hx    Sleep apnea Neg Hx     Social History: I have reviewed social history from progress note on 05/19/2021. Social History   Socioeconomic History   Marital status: Married    Spouse name: Not on file   Number of children: 3   Years of education: Not on file   Highest education level: Associate degree: academic program  Occupational History   Occupation: Aeronautical engineer    Comment: [employer]  Tobacco Use   Smoking status: Never   Smokeless tobacco:  Never  Vaping Use   Vaping Use: Never used  Substance and Sexual Activity   Alcohol use: Not Currently    Comment: rarely   Drug use: Never   Sexual activity: Not Currently    Birth control/protection: None  Other Topics Concern   Not on file  Social History Narrative      Works for [employer], has Associate Degree   3 children   Right handed    Social Determinants of Health   Financial Resource Strain: Not on file  Food Insecurity: Not on file  Transportation Needs: Not on file  Physical Activity: Not on file  Stress: Not on file  Social Connections: Not on file    Allergies:  Allergies  Allergen Reactions   Doxycycline Rash and Swelling    Mouth and face   Erythromycin Shortness Of Breath   Sulfa Antibiotics Shortness Of Breath   Emsam [Selegiline] Dermatitis    Allergic reaction to patch   Saxenda [Liraglutide -Weight Management] Nausea Only   Penicillins Rash    Metabolic Disorder Labs: Lab Results  Component Value Date   HGBA1C 5.4 05/27/2021   Lab Results  Component Value Date   PROLACTIN 3.6 (L) 05/27/2021   Lab Results  Component Value Date   CHOL 225 (H) 03/31/2021   TRIG 154.0 (H) 03/31/2021   HDL 54.10 03/31/2021   CHOLHDL 4 03/31/2021   VLDL 30.8 03/31/2021   LDLCALC 140 (H) 03/31/2021   LDLCALC 153 (H) 10/31/2019   Lab Results  Component Value Date   TSH 2.000 05/27/2021   TSH 2.32 03/27/2019    Therapeutic Level Labs: No results found for: "LITHIUM" No results found for: "VALPROATE" No results found for: "CBMZ"  Current Medications: Current Outpatient Medications  Medication Sig Dispense Refill   EPINEPHrine (EPIPEN 2-PAK) 0.3 mg/0.3 mL IJ SOAJ injection Inject 0.3 mLs (0.3 mg total) into the muscle as needed for anaphylaxis. 1 each 1   fluticasone (FLONASE) 50 MCG/ACT nasal spray Place 2 sprays into both nostrils daily as needed for allergies or rhinitis. 16 g 5   hydrOXYzine (VISTARIL) 25 MG capsule Take 1-2 capsules  (25-50 mg total) by mouth at bedtime as needed. For sleep 60 capsule 1   ipratropium (ATROVENT) 0.03 % nasal spray Place 1 spray into both nostrils 4 (four) times daily. Use as directed. 30 mL 5   levocetirizine (XYZAL) 5 MG tablet Take 1 tablet (5 mg total) by mouth every evening. 30 tablet 5   losartan-hydrochlorothiazide (HYZAAR) 50-12.5 MG tablet  TAKE 1 TABLET BY MOUTH DAILY 90 tablet 0   mesalamine (LIALDA) 1.2 g EC tablet TAKE FOUR TABLETS BY MOUTH DAILY WITH BREAKFAST 120 tablet 11   propranolol (INDERAL) 10 MG tablet Take 1 tablet (10 mg total) by mouth 3 (three) times daily as needed. For anxiety 90 tablet 1   cariprazine (VRAYLAR) 1.5 MG capsule Take 1 capsule (1.5 mg total) by mouth daily. Stop Abilify 90 capsule 0   sertraline (ZOLOFT) 100 MG tablet Take 1 tablet (100 mg total) by mouth daily with breakfast. 90 tablet 0   No current facility-administered medications for this visit.     Musculoskeletal: Strength & Muscle Tone:  left foot in cam boot , otherwise wnl Gait & Station: unsteady Patient leans: N/A  Psychiatric Specialty Exam: Review of Systems  Psychiatric/Behavioral: Negative.      Blood pressure 138/83, pulse 86, temperature (!) 97.5 F (36.4 C), temperature source Skin, height 5\' 7"  (1.702 m), weight (!) 301 lb 3.2 oz (136.6 kg).Body mass index is 47.17 kg/m.  General Appearance: Casual  Eye Contact:  Good  Speech:  Clear and Coherent  Volume:  Normal  Mood:  Euthymic  Affect:  Full Range  Thought Process:  Goal Directed and Descriptions of Associations: Intact  Orientation:  Full (Time, Place, and Person)  Thought Content: Logical   Suicidal Thoughts:  No  Homicidal Thoughts:  No  Memory:  Immediate;   Fair Recent;   Fair Remote;   Fair  Judgement:  Fair  Insight:  Fair  Psychomotor Activity:  Normal  Concentration:  Concentration: Fair and Attention Span: Fair  Recall:  [employer] of Knowledge: Fair  Language: Fair  Akathisia:  No  Handed:   Right  AIMS (if indicated):  done  Assets:  Communication Skills Desire for Improvement Housing Social Support Talents/Skills Transportation  ADL's:  Intact  Cognition: WNL  Sleep:  Fair   Screenings: Midwife Visit from 06/01/2022 in [facility] Office Visit from 03/04/2022 in [facility] Office Visit from 11/06/2021 in [facility] Office Visit from 10/01/2021 in [facility] Office Visit from 07/17/2021 in [facility]  AIMS Total Score 0 0 0 0 0      GAD-7    Flowsheet Row Office Visit from 06/01/2022 in [facility] Office Visit from 03/04/2022 in [facility] Office Visit from 01/22/2022 in [facility] Office Visit from 11/06/2021 in [facility] Office Visit from 10/01/2021 in [facility]  Total GAD-7 Score 0 3 3 4 9       PHQ2-9    Flowsheet Row Office Visit from 06/01/2022 in [facility] Office Visit from 03/04/2022 in [facility] Office Visit from 01/22/2022 in [facility] Office Visit from 11/06/2021 in [facility] Office Visit from 10/01/2021 in [facility]  PHQ-2 Total Score 2 3 6 2 3   PHQ-9 Total Score 7 12 13 8 10       Flowsheet Row Office Visit from 06/01/2022 in [facility] Video Visit from 04/29/2022 in [facility] Office Visit from 03/04/2022 in [facility]  C-SSRS RISK CATEGORY No Risk Low Risk No Risk        Assessment and Plan: JULETTA COSTELLA is a 57 year old Caucasian female, employed, lives in [place], separated, has a history of depression, binge eating disorder, multiple medical problems including chronic foot pain currently on cam walker boot-left side, obstructive sleep apnea currently on CPAP, currently improving with regards to her mood symptoms, will benefit from the following plan.  Plan MDD in remission Zoloft 100 mg p.o. daily with breakfast Continue Vraylar 1.5 mg p.o. daily EKG reviewed 05/06/2022--QTc 459, normal sinus rhythm. Hydroxyzine 25 mg p.o. nightly as needed Continue CBT with [facility]  GAD-improving Propranolol 10 mg p.o. 3 times daily as needed Zoloft 100 mg p.o. daily Continue CBT/DBT  Binge eating disorder-improving Continue CBT Currently not on Vyvanse due to elevated blood pressure reading.  Borderline personality disorder-improving Continue DBT/CBT with [facility].   Follow-up in clinic in 8 weeks or sooner if needed.  Collaboration of Care: Collaboration of Care: Referral or follow-up with counselor/therapist AEB patient encouraged to continue CBT.  Patient/Guardian was advised Release of Information must be obtained prior to any record release in order to collaborate their care with an outside provider. Patient/Guardian was advised if they have not already done so to contact the registration department to sign all necessary forms in order for [name] to release information regarding their care.   Consent: Patient/Guardian gives verbal consent for treatment and assignment of benefits for services provided during this visit. Patient/Guardian expressed understanding and agreed to proceed.  This note was generated in part or whole with voice recognition software. Voice recognition is usually quite accurate but there are transcription errors that can and very often do  occur. I apologize for any typographical errors that were not detected and corrected.     Jomarie Longs, MD 06/01/2022, 2:56 PM

## 2022-06-08 ENCOUNTER — Other Ambulatory Visit (INDEPENDENT_AMBULATORY_CARE_PROVIDER_SITE_OTHER): Payer: No Typology Code available for payment source

## 2022-06-08 DIAGNOSIS — R7989 Other specified abnormal findings of blood chemistry: Secondary | ICD-10-CM

## 2022-06-08 LAB — HEPATIC FUNCTION PANEL
ALT: 37 U/L — ABNORMAL HIGH (ref 0–35)
AST: 25 U/L (ref 0–37)
Albumin: 4.2 g/dL (ref 3.5–5.2)
Alkaline Phosphatase: 54 U/L (ref 39–117)
Bilirubin, Direct: 0.1 mg/dL (ref 0.0–0.3)
Total Bilirubin: 0.4 mg/dL (ref 0.2–1.2)
Total Protein: 7.3 g/dL (ref 6.0–8.3)

## 2022-07-14 NOTE — Progress Notes (Signed)
Leslie Douglas is a 57 y.o. female here for a follow up of a pre-existing problem.  History of Present Illness:   Chief Complaint  Patient presents with   Hypertension    Pt here for f/u on blood pressure. Has not been checking.    HPI  Hypertension Treated with losartan-hydrochlorothiazide 50-12.5 mg combo therapy. Blood pressure normal today at 130/86. Denies chest pain, shortness of breath, lower extremity swelling related to hypertension.   OSA Treated with CPAP. The mask is uncomfortable but this is helping symptoms.    Past Medical History:  Diagnosis Date   Allergy    Anxiety    Back pain    Binge eating disorder    Borderline personality disorder (HCC)    COPD (chronic obstructive pulmonary disease) (HCC)    chronic bronchitis   Depression    Edema, lower extremity    Hip pain    History of chicken pox    Joint pain    Memory loss    Pancolitis (HCC)    Poor concentration    Seasonal allergies    Skin cancer, basal cell    basal cell   Tinnitus    Vitamin D deficiency      Social History   Tobacco Use   Smoking status: Never   Smokeless tobacco: Never  Vaping Use   Vaping Use: Never used  Substance Use Topics   Alcohol use: Not Currently    Comment: rarely   Drug use: Never    Past Surgical History:  Procedure Laterality Date   CHOLECYSTECTOMY  2003   MOHS SURGERY  10/2017   SKIN SURGERY  05/2020   nose skin cancer   VAGINAL DELIVERY  1995   WISDOM TOOTH EXTRACTION  12/2017    Family History  Problem Relation Age of Onset   Hypertension Mother    Diabetes Father    Colon cancer Neg Hx    Esophageal cancer Neg Hx    Rectal cancer Neg Hx    Stomach cancer Neg Hx    Pancreatic cancer Neg Hx    Inflammatory bowel disease Neg Hx    Liver disease Neg Hx    Allergic rhinitis Neg Hx    Angioedema Neg Hx    Asthma Neg Hx    Eczema Neg Hx    Urticaria Neg Hx    Immunodeficiency Neg Hx    Atopy Neg Hx    Mental illness Neg Hx     Sleep apnea Neg Hx     Allergies  Allergen Reactions   Doxycycline Rash and Swelling    Mouth and face   Erythromycin Shortness Of Breath   Sulfa Antibiotics Shortness Of Breath   Emsam [Selegiline] Dermatitis    Allergic reaction to patch   Saxenda [Liraglutide -Weight Management] Nausea Only   Penicillins Rash    Current Medications:   Current Outpatient Medications:    cariprazine (VRAYLAR) 1.5 MG capsule, Take 1 capsule (1.5 mg total) by mouth daily. Stop Abilify, Disp: 90 capsule, Rfl: 0   EPINEPHrine (EPIPEN 2-PAK) 0.3 mg/0.3 mL IJ SOAJ injection, Inject 0.3 mLs (0.3 mg total) into the muscle as needed for anaphylaxis., Disp: 1 each, Rfl: 1   fluticasone (FLONASE) 50 MCG/ACT nasal spray, Place 2 sprays into both nostrils daily as needed for allergies or rhinitis., Disp: 16 g, Rfl: 5   hydrOXYzine (VISTARIL) 25 MG capsule, Take 1-2 capsules (25-50 mg total) by mouth at bedtime as needed. For sleep, Disp: 60  capsule, Rfl: 1   ipratropium (ATROVENT) 0.03 % nasal spray, Place 1 spray into both nostrils 4 (four) times daily. Use as directed., Disp: 30 mL, Rfl: 5   levocetirizine (XYZAL) 5 MG tablet, Take 1 tablet (5 mg total) by mouth every evening., Disp: 30 tablet, Rfl: 5   losartan-hydrochlorothiazide (HYZAAR) 50-12.5 MG tablet, TAKE 1 TABLET BY MOUTH DAILY, Disp: 90 tablet, Rfl: 0   mesalamine (LIALDA) 1.2 g EC tablet, TAKE FOUR TABLETS BY MOUTH DAILY WITH BREAKFAST, Disp: 120 tablet, Rfl: 11   propranolol (INDERAL) 10 MG tablet, Take 1 tablet (10 mg total) by mouth 3 (three) times daily as needed. For anxiety, Disp: 90 tablet, Rfl: 1   sertraline (ZOLOFT) 100 MG tablet, Take 1 tablet (100 mg total) by mouth daily with breakfast., Disp: 90 tablet, Rfl: 0   Review of Systems:   Review of Systems  Constitutional:  Negative for fever and malaise/fatigue.  HENT:  Negative for congestion.   Eyes:  Negative for blurred vision.  Respiratory:  Negative for cough and shortness of  breath.   Cardiovascular:  Negative for chest pain, palpitations and leg swelling.  Gastrointestinal:  Negative for vomiting.  Musculoskeletal:  Positive for joint pain (Left ankle). Negative for back pain.  Skin:  Negative for rash.  Neurological:  Negative for loss of consciousness and headaches.    Vitals:   Vitals:   07/21/22 0814  BP: 130/86  Pulse: 72  Temp: (!) 97.1 F (36.2 C)  TempSrc: Temporal  SpO2: 97%  Weight: (!) 303 lb 4 oz (137.6 kg)  Height: 5\' 7"  (1.702 m)     Body mass index is 47.5 kg/m.  Physical Exam:   Physical Exam Vitals and nursing note reviewed.  Constitutional:      General: She is not in acute distress.    Appearance: She is well-developed. She is not ill-appearing or toxic-appearing.  Cardiovascular:     Rate and Rhythm: Normal rate and regular rhythm.     Pulses: Normal pulses.     Heart sounds: Normal heart sounds, S1 normal and S2 normal.  Pulmonary:     Effort: Pulmonary effort is normal.     Breath sounds: Normal breath sounds.  Skin:    General: Skin is warm and dry.  Neurological:     Mental Status: She is alert.     GCS: GCS eye subscore is 4. GCS verbal subscore is 5. GCS motor subscore is 6.  Psychiatric:        Speech: Speech normal.        Behavior: Behavior normal. Behavior is cooperative.     Assessment and Plan:   Primary hypertension Normotensive Continue losartan-hydrochlorothiazide 50-12.5 mg Encouraged monitoring Follow-up if uncontrolled numbers or symptom(s) Recommend follow-up in 6 month(s), for Comprehensive Physical Exam (CPE) preventive care annual visit -- sooner if concerns  OSA Ongoing Encouraged consistent use Follow-up with prescriber if concerns   I,Alexander Ruley,acting as a scribe for Jarold Motto, PA.,have documented all relevant documentation on the behalf of Jarold Motto, PA,as directed by  Jarold Motto, PA while in the presence of Jarold Motto, [place].   I, Jarold Motto,  [place], have reviewed all documentation for this visit. The documentation on 07/21/22 for the exam, diagnosis, procedures, and orders are all accurate and complete.    Jarold Motto, PA-C

## 2022-07-21 ENCOUNTER — Encounter: Payer: Self-pay | Admitting: Physician Assistant

## 2022-07-21 ENCOUNTER — Ambulatory Visit: Payer: No Typology Code available for payment source | Admitting: Physician Assistant

## 2022-07-21 VITALS — BP 130/86 | HR 72 | Temp 97.1°F | Ht 67.0 in | Wt 303.2 lb

## 2022-07-21 DIAGNOSIS — G4733 Obstructive sleep apnea (adult) (pediatric): Secondary | ICD-10-CM | POA: Diagnosis not present

## 2022-07-21 DIAGNOSIS — I10 Essential (primary) hypertension: Secondary | ICD-10-CM | POA: Diagnosis not present

## 2022-07-21 NOTE — Patient Instructions (Signed)
It was great to see you!  Blood pressure looks great -- continue current regimen!  Please get your kidney function checked when you do blood work with your psychiatrist.  Let's follow-up in 6 months for an annual physical, sooner if you have concerns.  Take care,  Jarold Motto PA-C

## 2022-07-30 ENCOUNTER — Ambulatory Visit: Payer: No Typology Code available for payment source | Admitting: Psychiatry

## 2022-08-03 ENCOUNTER — Ambulatory Visit (INDEPENDENT_AMBULATORY_CARE_PROVIDER_SITE_OTHER): Payer: No Typology Code available for payment source | Admitting: Neurology

## 2022-08-03 ENCOUNTER — Encounter: Payer: Self-pay | Admitting: Neurology

## 2022-08-03 ENCOUNTER — Ambulatory Visit: Payer: No Typology Code available for payment source | Admitting: Nurse Practitioner

## 2022-08-03 ENCOUNTER — Telehealth: Payer: Self-pay | Admitting: Physician Assistant

## 2022-08-03 ENCOUNTER — Encounter: Payer: Self-pay | Admitting: Nurse Practitioner

## 2022-08-03 VITALS — BP 122/78 | HR 78 | Temp 98.0°F | Ht 67.0 in | Wt 307.6 lb

## 2022-08-03 VITALS — BP 112/66 | HR 75 | Ht 67.0 in | Wt 306.0 lb

## 2022-08-03 DIAGNOSIS — L740 Miliaria rubra: Secondary | ICD-10-CM

## 2022-08-03 DIAGNOSIS — G4733 Obstructive sleep apnea (adult) (pediatric): Secondary | ICD-10-CM | POA: Diagnosis not present

## 2022-08-03 DIAGNOSIS — L559 Sunburn, unspecified: Secondary | ICD-10-CM | POA: Diagnosis not present

## 2022-08-03 NOTE — Telephone Encounter (Signed)
Patient is scheduled with Alysia Penna [facility] 08/03/22   Final Disposition: See PCP within 24 Hours   Patient Name First: Leslie Last: Douglas Gender: Female DOB: 03-16-1965 Age: 57 Y 2 M 2 D Return Phone Number: [phone] (Primary) Address: City/ State/ Zip: [place] [facility] - [facility], [facility] Provider Bufford Buttner, [place]- PA Contact Type Call Who Is Calling Patient / Member / Family / Caregiver Call Type Triage / Clinical Relationship To Patient Self Return Phone Number [phone] (Primary) Chief Complaint Hives Reason for Call Symptomatic / Request for Health Information Initial Comment [name], rash with liquid filled on top of hand, feet and thighs. Translation No Nurse Assessment Nurse: Charna Elizabeth, RN, Cathy Date/Time (Eastern Time): 08/03/2022 8:36:37 AM Confirm and document reason for call. If symptomatic, describe symptoms. ---Leslie Douglas states that she developed Hives about 3 days ago that are worse today. No severe breathing or blueness around her lips. No swallowing difficulty. No fever. Alert and responsive. Does the patient have any new or worsening symptoms? ---Yes Will a triage be completed? ---Yes Related visit to physician within the last 2 weeks? ---No Does the PT have any chronic conditions? (i.e. diabetes, asthma, this includes High risk factors for pregnancy, etc.) ---Yes List chronic conditions. ---Depression, Anxiety, Colitis Is this a behavioral health or substance abuse call? ---No Guidelines Guideline Title Affirmed Question Affirmed Notes Nurse Date/Time (Eastern Time) Hives [1] MODERATESEVERE hives persist (i.e., hives interfere with normal activities or work) AND [2] taking antihistamine (e.g., [place], RN, Lynden Ang 08/03/2022 8:38:33 AM  Guidelines Guideline Title Affirmed Question Affirmed Notes Nurse Date/Time  (Eastern Time) Benadryl, Claritin) > 24 hours Disp. Time Lamount Cohen Time) Disposition Final User 08/03/2022 8:41:49 AM See PCP within 24 Hours Yes Charna Elizabeth, RN, Lynden Ang Final Disposition 08/03/2022 8:41:49 AM See PCP within 24 Hours Yes Tr[place]ull, RN, Frann Rider Disagree/Comply Comply Caller Understands Yes PreDisposition Go to Urgent Care/Walk-In Clinic Care Advice Given Per Guideline SEE PCP WITHIN 24 HOURS: * IF OFFICE WILL BE OPEN: You need to be examined within the next 24 hours. Call your doctor (or NP/PA) when the office opens and make an appointment. COOL BATH FOR ITCHING: * Take a cool bath for 10 minutes to relieve itching. (Caution: avoid any chill). PREVENTION - REMOVE ALLERGENS: * Take a bath or shower if triggered by pollens or animal contact. * Change clothes. * Rub very itchy areas with an ice cube for 10 minutes. CALL BACK IF: * You become worse CARE ADVICE given per Hives (Adult) guideline. ANTIHISTAMINE MEDICINES - EXTRA NOTES AND WARNINGS: * Antihistamine medicines can be used to treat allergic reactions, allergies, hay fever, hives, and itching. * Diphenhydramine (Benadryl) is a FIRST GENERATION ANTIHISTAMINE medicine. It can make you more sleepy than the newer second generation antihistamine medicines. The adult dose of Benadryl is 25 to 50 mg by mouth. You can take it up to 4 times a day.  Comments User: Colonel Bald, RN Date/Time Lamount Cohen Time): 08/03/2022 8:42:24 AM Transferred to the office to check on appointment options. Referrals REFERRED TO PCP OFFICE

## 2022-08-03 NOTE — Progress Notes (Signed)
Established Patient Visit  Patient: Leslie Douglas   DOB: 1965-12-21   57 y.o. Female  MRN: [id number] Visit Date: 08/03/2022  Subjective:    Chief Complaint  Patient presents with   Sunburn    Possible sunburn both legs, arms and top of both feet. Warm, itchy with fluid filled blisters and rash x 3 days. Pt states it started on left hand. Using benadryl that does not help.    Rash This is a new problem. The current episode started in the past 7 days. The problem is unchanged. The affected locations include the left arm, right arm, left lower leg, left foot, right lower leg, right foot, chest and face. Associated with: sun exposure. Pertinent negatives include no anorexia, congestion, cough, diarrhea, eye pain, facial edema, fatigue, fever, joint pain, nail changes, rhinorrhea, shortness of breath, sore throat or vomiting. (Itching and burning sensation) Past treatments include topical steroids. The treatment provided no relief.   Reviewed medical, surgical, and social history today  Medications: Outpatient Medications Prior to Visit  Medication Sig   cariprazine (VRAYLAR) 1.5 MG capsule Take 1 capsule (1.5 mg total) by mouth daily. Stop Abilify   EPINEPHrine (EPIPEN 2-PAK) 0.3 mg/0.3 mL IJ SOAJ injection Inject 0.3 mLs (0.3 mg total) into the muscle as needed for anaphylaxis.   fluticasone (FLONASE) 50 MCG/ACT nasal spray Place 2 sprays into both nostrils daily as needed for allergies or rhinitis.   hydrOXYzine (VISTARIL) 25 MG capsule Take 1-2 capsules (25-50 mg total) by mouth at bedtime as needed. For sleep   ipratropium (ATROVENT) 0.03 % nasal spray Place 1 spray into both nostrils 4 (four) times daily. Use as directed.   levocetirizine (XYZAL) 5 MG tablet Take 1 tablet (5 mg total) by mouth every evening.   losartan-hydrochlorothiazide (HYZAAR) 50-12.5 MG tablet TAKE 1 TABLET BY MOUTH DAILY   mesalamine (LIALDA) 1.2 g EC tablet TAKE FOUR TABLETS BY MOUTH DAILY WITH  BREAKFAST   propranolol (INDERAL) 10 MG tablet Take 1 tablet (10 mg total) by mouth 3 (three) times daily as needed. For anxiety   sertraline (ZOLOFT) 100 MG tablet Take 1 tablet (100 mg total) by mouth daily with breakfast.   No facility-administered medications prior to visit.   Reviewed past medical and social history.   ROS per HPI above      Objective:  BP 122/78 (BP Location: Left Arm, Patient Position: Sitting, Cuff Size: Large)   Pulse 78   Temp 98 F (36.7 C)   Ht 5\' 7"  (1.702 m)   Wt (!) 307 lb 9.6 oz (139.5 kg)   SpO2 97%   BMI 48.18 kg/m      Physical Exam Vitals reviewed.  Constitutional:      General: She is not in acute distress. Cardiovascular:     Rate and Rhythm: Normal rate.     Pulses: Normal pulses.  Pulmonary:     Effort: Pulmonary effort is normal.  Skin:    Findings: Erythema and rash present. Rash is papular and vesicular.          Comments: No sign of cellulitis  Neurological:     Mental Status: She is oriented to person, place, and time.     No results found for any visits on 08/03/22.    Assessment & Plan:    Problem List Items Addressed This Visit   None Visit Diagnoses     Sunburn    -  Primary   Heat rash         Use calamine lotion or aloe vera gel to soothe skin. Wear protective clothing at all times. Use vistaril/hydroxyzine 25mg  every 8hrs as needed for itching.  Return if symptoms worsen or fail to improve.     Alysia Penna, NP

## 2022-08-03 NOTE — Patient Instructions (Addendum)
Use calamine lotion or aloe vera gel to soothe skin. Wear protective clothing at all times. Use vistaril/hydroxyzine 25mg  every 8hrs as needed for itching.  Sunburn, Adult  Sunburn is damage to the skin that is caused by too much exposure to ultraviolet (UV) rays. Repeated, prolonged sun exposure causes signs of early skin aging, such as wrinkles and sun spots. It also increases the risk of skin cancer. What are the causes? Sunburn is caused by getting too much UV radiation from the sun, sunlamps, or tanning beds. What increases the risk? The following factors may make you more likely to develop this condition: Having light-colored skin (fair complexion), skin with many freckles or moles, or skin that tends to burn instead of tan. Having fair or red hair. Having blue or green eyes. Other factors include: Living in an area with strong sun exposure. Having a family history of sensitivity to the sun or a family history of skin cancer. Having a body defense system (immune system) that does not work properly because of certain diseases (such as lupus) or certain drugs. Taking certain medicines that cause you to be sensitive to sunlight (have photosensitivity). What are the signs or symptoms? Symptoms of this condition include: Red or pink skin. Soreness and swelling of the skin in the affected areas. Pain. Blisters. Peeling skin. If the sunburn is severe, you may also have a headache, fever, nausea, dizziness, or fatigue. How is this diagnosed? This condition is diagnosed with a medical history and physical exam. How is this treated? Mild or moderate sunburns can often be managed with self-care strategies, including: Cool baths or cool, wet cloths (cool compresses). Moisturizer or aloe for pain relief. Over-the-counter pain relievers. Drinking extra water to replace lost fluids and to prevent dehydration. A severe sunburn may require: Antibiotic medicines if there is an associated  infection. IV fluids. Follow these instructions at home: Medicines Take or apply over-the-counter and prescription medicines only as told by your health care provider. If you were prescribed an antibiotic medicine, use it as told by your health care provider. Do not stop using the antibiotic even if your condition improves. General instructions Avoid further exposure to the sun. Protect sunburned skin by wearing clothing that covers the injured skin. Do not put ice on your sunburn. This can cause further damage. Try taking a cool bath or applying a cool compress to your skin. This may help with pain. Drink enough fluid to keep your urine pale yellow. Try applying aloe vera or a moisturizer that has soy in it to your sunburn. This may help. Do not apply aloe vera or moisturizer with soy if your sunburn has blisters. Do not break any blisters that you may have. Keep all follow-up visits. This is important. How is this prevented?  Try to avoid the sun between 10 a.m. and 4 p.m. The sun is strongest during those hours. Apply sunscreen 15-30 minutes before you will be out in the sun. Apply a sunscreen with an SPF of 30 or higher. Consider using an SPF of 30 or higher if you will be exposed to the sun for prolonged periods of time. Use a sunscreen that protects against all of the sun's rays (broad-spectrum) and is water-resistant. Reapply sunscreen: About every 2 hours during sun exposure. More often when sweating a lot while out in the sun. After getting wet from swimming or playing in water. When you are outside, wear long sleeves, a hat, and sunglasses that block UV light. Talk with your  health care provider about medicines, herbs, and foods that can make you more sensitive to light. Avoid these, if possible. Do not use tanning beds. Contact a health care provider if: You have a fever or chills. Your symptoms do not improve with treatment. Your pain is not controlled with medicine. Your  burn becomes more painful or swollen. You develop open blisters. Get help right away if: You are dizzy or you pass out. You have a severe headache or you feel confused. You vomit or have diarrhea. You develop severe blistering. You have pus or fluid coming from the blisters. These symptoms may represent a serious problem that is an emergency. Do not wait to see if the symptoms will go away. Get medical help right away. Call your local emergency services (911 in the [place] Do not drive yourself to the hospital. Summary Sunburn is caused by getting too much ultraviolet (UV) radiation from the sun, sunlamps, or tanning beds. People with light-colored skin (fair complexion) have an increased risk of sunburn. Mild or moderate sunburns can often be managed with self-care strategies, including cool baths or cool cloths (compresses). To help prevent sunburn, apply sunscreen 15-30 minutes or more before you will be exposed to the sun. This information is not intended to replace advice given to you by your health care provider. Make sure you discuss any questions you have with your health care provider. Document Revised: 04/02/2020 Document Reviewed: 04/02/2020 Elsevier Patient Education  2024 [employer].

## 2022-08-03 NOTE — Telephone Encounter (Signed)
FYI: This call has been transferred to triage nurse: the Triage Nurse. Once the result note has been entered staff can address the message at that time.  Patient called in with the following symptoms:  Red Word: Hives, rash with liquid filled blisters on hands, arms and top of both feet, top of both thighs . States she thinks she is having allergic reaction.   Please advise at Mobile [phone] (mobile)  Message is routed to Provider Pool.

## 2022-08-03 NOTE — Telephone Encounter (Signed)
FYI, see Triage note. Pt scheduled today. 

## 2022-08-03 NOTE — Patient Instructions (Addendum)
It was nice to see you again today. I am glad to hear, you have adjusted fairly well to treatment with your new autoPAP machine, and you are compliant with it. You have also fulfilled the insurance-mandated compliance percentage, which is reassuring, so you can get ongoing supplies through your insurance. Please talk to your DME provider about getting replacement supplies on a regular basis. Please be sure to change your filter every month, your mask about every 3 months, hose about every 6 months, humidifier chamber about yearly. Some restrictions are imposed by your insurance carrier with regard to how frequently you can get certain supplies.  Your DME company can provide further details if necessary.   Please continue using your autoPAP regularly. While your insurance requires that you use PAP at least 4 hours each night on 70% of the nights, I recommend, that you not skip any nights and use it throughout the night if you can. Getting used to PAP and staying with the treatment long term does take time and patience and discipline. Untreated obstructive sleep apnea when it is moderate to severe can have an adverse impact on cardiovascular health and raise her risk for heart disease, arrhythmias, hypertension, congestive heart failure, stroke and diabetes. Untreated obstructive sleep apnea causes sleep disruption, nonrestorative sleep, and sleep deprivation. This can have an impact on your day to day functioning and cause daytime sleepiness and impairment of cognitive function, memory loss, mood disturbance, and problems focussing. Using PAP regularly can improve these symptoms. Unfortunately, fatigue can be a common issue and may not improve very much if you have other medical conditions or medications that can cause tiredness.  You do take several medications that can worsen fatigue or cause tiredness.  Please continue to work on weight loss and follow-up with your other providers on a regular basis. We can  see you in 1 year, you can see one of our nurse practitioners as you are stable.

## 2022-08-03 NOTE — Progress Notes (Signed)
Subjective:    Patient ID: Leslie Douglas is a 57 y.o. female.  HPI    Interim history:   Leslie Douglas is a 57 year old female with an underlying medical history of hypertension, left foot tendinitis, vitamin D deficiency, allergies, anxiety, depression, back pain, COPD, tinnitus, and severe obesity with a BMI of over 45, who presents for follow-up consultation of her obstructive sleep apnea after interim testing and starting home AutoPap therapy.  The patient is unaccompanied today.  I first met her at the request of her primary care provider on 03/08/2022, at which time she reported snoring and excessive daytime somnolence.  She was advised to proceed with a sleep study.  She had a home sleep test on 04/10/2022 which showed severe obstructive sleep apnea with a total AHI of 53.2/hour and O2 nadir of 84%. Snoring was detected, in the moderate to loud range, at times milder.  She was advised to proceed with home AutoPap therapy.  Her set up date was 05/10/2022, she has a ResMed air sense 11 AutoSet machine, her DME provider is [facility].   Today, 08/03/2022: I reviewed her AutoPap compliance data from 07/02/2022 through 07/31/2022, which is a total of 30 days, during which time she used her machine 25 days with percent use days greater than 4 hours at 80%, indicating very good compliance with an average usage of 5 hours and 14 minutes, residual AHI at goal at 0.6/h, average pressure for the 95th percentile at 11.2 cm with a range of 6-12 cm with EPR of 3.  Leak acceptable with significant fluctuation, 95th percentile at 15.2 L/min.  She reports doing well, has adjusted to the mask and quite well, using an under the nose fullface mask, likely from [employer]Med, F30i, but does not like to wake up with indentations on her face.  She does take the mask off about an hour before she actually has to get out of bed, sleeps some without it, did not take it to the beach this past weekend and fell asleep after taking Benadryl  because she had a rash and it was itchy and she was uncomfortable.  She is very motivated to continue with treatment, she will make an appointment with her primary care to discuss her son rash.  She used sunscreen at [employer].  She had a medication change from Abilify to [place] about 2 months ago.  She continues to take sertraline.  She does not feel that her energy level has changed much, her Epworth sleepiness score is 3 out of 24, previously was 6.  Fatigue score is still there.  The patient's allergies, current medications, family history, past medical history, past social history, past surgical history and problem list were reviewed and updated as appropriate.   Previously:   03/08/22: (She) reports snoring and excessive daytime somnolence.  Her Epworth sleepiness score is 6 out of 24, fatigue severity score is 47 out of 63.  I reviewed your office note from 01/21/2022. She is tired during the day, does not always wake up rested.  She currently lives alone, she is separated.  She has 3 grown sons, her twins live in [place] and her youngest son is at [facility].  She works for [employer] in the office and also some from home.  She is a non-smoker and does not drink alcohol, she limits her caffeine to 1 cup of coffee per day and 1 soda per day.  Bedtime is generally around 930 but she is on her  phone for a little bit and then turns it off.  She does not actually have a TV in her bedroom.  She has 2 cats in the household who generally sleep in her bedroom and sometimes on the bed.  Rise time is around 6 or 6:30 AM.  She does not have night to night nocturia or recurrent nocturnal or morning headaches.  She is not aware of any family history of sleep apnea.  She was encouraged by her psychiatrist to seek sleep testing.  She recently had medication changes, she is no longer on Viibryd and was started on sertraline, recently increased to 100 mg daily.  She is also on Abilify 10 mg in the mornings.   Weight has been more or less stable in the past year but she has slowly gained weight over time in the past.  She also has a eating disorder and when she was treated for tendinitis with prednisone for about 10 days, her appetite increased and her binge eating increased she reports.  She is a stomach and side sleeper, she would be concerned about being able to use a CPAP machine because of her sleep positions but generally speaking would be willing to try PAP therapy if the need arises.   Her Past Medical History Is Significant For: Past Medical History:  Diagnosis Date   Allergy    Anxiety    Back pain    Binge eating disorder    Borderline personality disorder (HCC)    COPD (chronic obstructive pulmonary disease) (HCC)    chronic bronchitis   Depression    Edema, lower extremity    Hip pain    History of chicken pox    Joint pain    Memory loss    Pancolitis (HCC)    Poor concentration    Seasonal allergies    Skin cancer, basal cell    basal cell   Tinnitus    Vitamin D deficiency     Her Past Surgical History Is Significant For: Past Surgical History:  Procedure Laterality Date   CHOLECYSTECTOMY  2003   MOHS SURGERY  10/2017   SKIN SURGERY  05/2020   nose skin cancer   VAGINAL DELIVERY  1995   WISDOM TOOTH EXTRACTION  12/2017    Her Family History Is Significant For: Family History  Problem Relation Age of Onset   Hypertension Mother    Diabetes Father    Colon cancer Neg Hx    Esophageal cancer Neg Hx    Rectal cancer Neg Hx    Stomach cancer Neg Hx    Pancreatic cancer Neg Hx    Inflammatory bowel disease Neg Hx    Liver disease Neg Hx    Allergic rhinitis Neg Hx    Angioedema Neg Hx    Asthma Neg Hx    Eczema Neg Hx    Urticaria Neg Hx    Immunodeficiency Neg Hx    Atopy Neg Hx    Mental illness Neg Hx    Sleep apnea Neg Hx     Her Social History Is Significant For: Social History   Socioeconomic History   Marital status: Married    Spouse  name: Not on file   Number of children: 3   Years of education: Not on file   Highest education level: Associate degree: academic program  Occupational History   Occupation: Aeronautical engineer    Comment: [employer]  Tobacco Use   Smoking status: Never   Smokeless tobacco: Never  Vaping Use   Vaping status: Never Used  Substance and Sexual Activity   Alcohol use: Not Currently    Comment: rarely   Drug use: Never   Sexual activity: Not Currently    Birth control/protection: None  Other Topics Concern   Not on file  Social History Narrative      Works for [employer], has Associate Degree   3 children   Right handed    Social Determinants of Health   Financial Resource Strain: Low Risk  (07/20/2022)   Overall Financial Resource Strain (CARDIA)    Difficulty of Paying Living Expenses: Not hard at all  Food Insecurity: No Food Insecurity (07/20/2022)   Hunger Vital Sign    Worried About Running Out of Food in the Last Year: Never true    Ran Out of Food in the Last Year: Never true  Transportation Needs: No Transportation Needs (07/20/2022)   PRAPARE - Administrator, Civil Service (Medical): No    Lack of Transportation (Non-Medical): No  Physical Activity: Unknown (07/20/2022)   Exercise Vital Sign    Days of Exercise per Week: 0 days    Minutes of Exercise per Session: Not on file  Stress: No Stress Concern Present (07/20/2022)   [employer] of Occupational Health - Occupational Stress Questionnaire    Feeling of Stress : Only a little  Social Connections: Unknown (07/20/2022)   Social Connection and Isolation Panel [NHANES]    Frequency of Communication with Friends and Family: Once a week    Frequency of Social Gatherings with Friends and Family: Patient declined    Attends Religious Services: Never    Database administrator or Organizations: No    Attends Engineer, structural: Not on file    Marital Status: Divorced    Her Allergies Are:   Allergies  Allergen Reactions   Doxycycline Rash and Swelling    Mouth and face   Erythromycin Shortness Of Breath   Sulfa Antibiotics Shortness Of Breath   Emsam [Selegiline] Dermatitis    Allergic reaction to patch   Saxenda [Liraglutide -Weight Management] Nausea Only   Penicillins Rash  :   Her Current Medications Are:  Outpatient Encounter Medications as of 08/03/2022  Medication Sig   cariprazine (VRAYLAR) 1.5 MG capsule Take 1 capsule (1.5 mg total) by mouth daily. Stop Abilify   EPINEPHrine (EPIPEN 2-PAK) 0.3 mg/0.3 mL IJ SOAJ injection Inject 0.3 mLs (0.3 mg total) into the muscle as needed for anaphylaxis.   fluticasone (FLONASE) 50 MCG/ACT nasal spray Place 2 sprays into both nostrils daily as needed for allergies or rhinitis.   hydrOXYzine (VISTARIL) 25 MG capsule Take 1-2 capsules (25-50 mg total) by mouth at bedtime as needed. For sleep   ipratropium (ATROVENT) 0.03 % nasal spray Place 1 spray into both nostrils 4 (four) times daily. Use as directed.   levocetirizine (XYZAL) 5 MG tablet Take 1 tablet (5 mg total) by mouth every evening.   losartan-hydrochlorothiazide (HYZAAR) 50-12.5 MG tablet TAKE 1 TABLET BY MOUTH DAILY   mesalamine (LIALDA) 1.2 g EC tablet TAKE FOUR TABLETS BY MOUTH DAILY WITH BREAKFAST   propranolol (INDERAL) 10 MG tablet Take 1 tablet (10 mg total) by mouth 3 (three) times daily as needed. For anxiety   sertraline (ZOLOFT) 100 MG tablet Take 1 tablet (100 mg total) by mouth daily with breakfast.   No facility-administered encounter medications on file as of 08/03/2022.  :  Review of Systems:  Out of  a complete 14 point review of systems, all are reviewed and negative with the exception of these symptoms as listed below:  Review of Systems  Neurological:        Pt here for CPAP f/u Pt states not able to tell a difference since starting CPAP machine Pt states still fatigue     ESS:3    Objective:  Neurological Exam  Physical Exam Physical  Examination:   Vitals:   08/03/22 0733  BP: 112/66  Pulse: 75    General Examination: The patient is a very pleasant 57 y.o. female in no acute distress. She appears well-developed and well-nourished and well groomed.   HEENT: Normocephalic, atraumatic, pupils are equal, round and reactive, corrective eyeglasses in place.  Extraocular tracking is well-preserved. Hearing is grossly intact. Face is symmetric with normal facial animation. Speech is clear with no dysarthria noted. There is no hypophonia. There is no lip, neck/head, jaw or voice tremor. Neck with full range of active motion. There are no carotid bruits on auscultation. Oropharynx exam reveals: mild mouth dryness, adequate dental hygiene and moderate airway crowding. Tongue protrudes centrally.   Chest: Clear to auscultation without wheezing, rhonchi or crackles noted.   Heart: S1+S2+0, regular and normal without murmurs, rubs or gallops noted.    Abdomen: Soft, non-tender and non-distended.   Extremities: There is nonpitting puffiness around the left ankle.    Skin: Warm and dry without trophic changes noted.    Musculoskeletal: exam reveals no obvious joint deformities.    Neurologically:  Mental status: The patient is awake, alert and oriented in all 4 spheres. Her immediate and remote memory, attention, language skills and fund of knowledge are appropriate. There is no evidence of aphasia, agnosia, apraxia or anomia. Speech is clear with normal prosody and enunciation. Thought process is linear. Mood is normal and affect is normal.  Cranial nerves II - XII are as described above under HEENT exam.  Motor exam: Normal bulk, moving all 4 extremities without limitation, no obvious action or resting tremor.  Fine motor skills and coordination: grossly intact.  Cerebellar testing: No dysmetria or intention tremor. There is no truncal or gait ataxia.  Sensory exam: intact to light touch in the upper and lower extremities.   Gait, station and balance: She walks without difficulty.     Assessment and Plan:  In summary, Donis K Common is a 57 year old female with an underlying medical history of hypertension, left foot tendinitis, vitamin D deficiency, allergies, anxiety, depression, back pain, COPD, tinnitus, and severe obesity with a BMI of over 45, who presents for follow-up consultation of her obstructive sleep apnea after interim testing and starting home AutoPap therapy.  She had a home sleep test on 04/10/2022 which showed severe obstructive sleep apnea with a total AHI of 53.2/hour and O2 nadir of 84%. Snoring was mostly in the moderate to loud range.  She has been on AutoPap therapy since 05/10/2022, she has a ResMed air sense 11 AutoSet machine, her DME provider is [facility]. She is using an under the nose style fullface mask with good tolerance.  Apnea control is very good, leak is acceptable from her mask, compliance is very good for consistency of usage but duration of treatment could be longer, she has an average usage of little over 5 hours for this past month.  She is encouraged to be consistent with her AutoPap, we talked about her sleep test results in detail and reviewed her compliance data as well.  She is  motivated to continue with treatment and will try to be more consistent, she will try sleeves that you can put over the head to reduce the indentation on the face.  She is encouraged to work on weight loss and follow-up routinely in this clinic to see one of our nurse practitioners in 1 year.  We can offer her a virtual visit through MyChart if she prefers.  She is commended for treatment adherence.  I answered all her questions today and she is in agreement with our plan.   I spent 30 minutes in total face-to-face time and in reviewing records during pre-charting, more than 50% of which was spent in counseling and coordination of care, reviewing test results, reviewing medications and treatment regimen and/or  in discussing or reviewing the diagnosis of OSA, the prognosis and treatment options. Pertinent laboratory and imaging test results that were available during this visit with the patient were reviewed by me and considered in my medical decision making (see chart for details).

## 2022-08-03 NOTE — Telephone Encounter (Signed)
Noted. Pt scheduled to see Alysia Penna at 2:00 08/03/22.

## 2022-08-21 ENCOUNTER — Other Ambulatory Visit: Payer: Self-pay | Admitting: Physician Assistant

## 2022-09-01 ENCOUNTER — Ambulatory Visit: Payer: No Typology Code available for payment source | Admitting: Adult Health

## 2022-09-02 ENCOUNTER — Ambulatory Visit (INDEPENDENT_AMBULATORY_CARE_PROVIDER_SITE_OTHER): Payer: No Typology Code available for payment source | Admitting: Psychiatry

## 2022-09-02 ENCOUNTER — Encounter: Payer: Self-pay | Admitting: Psychiatry

## 2022-09-02 VITALS — BP 136/89 | HR 89 | Temp 97.8°F | Ht 67.0 in | Wt 308.8 lb

## 2022-09-02 DIAGNOSIS — F411 Generalized anxiety disorder: Secondary | ICD-10-CM | POA: Diagnosis not present

## 2022-09-02 DIAGNOSIS — F5081 Binge eating disorder: Secondary | ICD-10-CM

## 2022-09-02 DIAGNOSIS — F3342 Major depressive disorder, recurrent, in full remission: Secondary | ICD-10-CM | POA: Diagnosis not present

## 2022-09-02 DIAGNOSIS — F603 Borderline personality disorder: Secondary | ICD-10-CM | POA: Diagnosis not present

## 2022-09-02 DIAGNOSIS — G4733 Obstructive sleep apnea (adult) (pediatric): Secondary | ICD-10-CM

## 2022-09-02 MED ORDER — SERTRALINE HCL 100 MG PO TABS: 100.0000 mg | ORAL_TABLET | Freq: Every day | ORAL | 1 refills | Status: DC

## 2022-09-02 MED ORDER — CARIPRAZINE HCL 1.5 MG PO CAPS: 1.5000 mg | ORAL_CAPSULE | Freq: Every day | ORAL | 1 refills | Status: DC

## 2022-09-02 NOTE — Progress Notes (Signed)
BH MD OP Progress Note  09/02/2022 9:04 AM Leslie Douglas  MRN:  [id number]  Chief Complaint:  Chief Complaint  Patient presents with   Follow-up   Depression   Anxiety   Medication Refill   HPI: Leslie Douglas is a 57 year old Caucasian employed, separated, lives in [place], has a history of MDD, GAD, binge eating disorder, borderline personality disorder was evaluated in office today.  Patient today reports she is currently doing well.  Was able to spent some time with her son who is a Holiday representative at [employer].  She reports that helped her with her mood.  Patient also reports work is going well.  Denies any significant anxiety or mood swings.  Denies any irritability.  Patient reports the combination of sertraline and Vraylar is beneficial.  Denies side effects.  Patient reports sleep is overall good.  She currently uses the CPAP.  Patient however does not manage that does make a difference when she wakes up in the morning.  She however is motivated to stay on it.  Patient denies any suicidality, homicidality or self-injurious behaviors.  Continues to follow-up with her therapist.  Denies any significant binging episodes.  Although since she was able to take a break to the beach spending time with her son, may not have been able to watch her diet like she would like to.  Denies any other concerns today.  Visit Diagnosis:    ICD-10-CM   1. MDD (major depressive disorder), recurrent, in full remission (HCC)  F33.42 sertraline (ZOLOFT) 100 MG tablet    cariprazine (VRAYLAR) 1.5 MG capsule    2. GAD (generalized anxiety disorder)  F41.1 sertraline (ZOLOFT) 100 MG tablet    3. Binge eating disorder  F50.81     4. Borderline personality disorder (HCC)  F60.3     5. OSA (obstructive sleep apnea)  G47.33       Past Psychiatric History: I have reviewed past psychiatric history from progress note on 05/19/2021  Past Medical History:  Past Medical History:  Diagnosis Date    Allergy    Anxiety    Back pain    Binge eating disorder    Borderline personality disorder (HCC)    COPD (chronic obstructive pulmonary disease) (HCC)    chronic bronchitis   Depression    Edema, lower extremity    Hip pain    History of chicken pox    Joint pain    Memory loss    Pancolitis (HCC)    Poor concentration    Seasonal allergies    Skin cancer, basal cell    basal cell   Tinnitus    Vitamin D deficiency     Past Surgical History:  Procedure Laterality Date   CHOLECYSTECTOMY  2003   MOHS SURGERY  10/2017   SKIN SURGERY  05/2020   nose skin cancer   VAGINAL DELIVERY  1995   WISDOM TOOTH EXTRACTION  12/2017    Family Psychiatric History: I have reviewed family psychiatric history from progress note on 05/19/2021  Family History:  Family History  Problem Relation Age of Onset   Hypertension Mother    Diabetes Father    Colon cancer Neg Hx    Esophageal cancer Neg Hx    Rectal cancer Neg Hx    Stomach cancer Neg Hx    Pancreatic cancer Neg Hx    Inflammatory bowel disease Neg Hx    Liver disease Neg Hx    Allergic rhinitis Neg Hx  Angioedema Neg Hx    Asthma Neg Hx    Eczema Neg Hx    Urticaria Neg Hx    Immunodeficiency Neg Hx    Atopy Neg Hx    Mental illness Neg Hx    Sleep apnea Neg Hx     Social History: I have reviewed social history from progress note on 05/19/2021. Social History   Socioeconomic History   Marital status: Married    Spouse name: Not on file   Number of children: 3   Years of education: Not on file   Highest education level: Associate degree: academic program  Occupational History   Occupation: Aeronautical engineer    Comment: [employer]  Tobacco Use   Smoking status: Never   Smokeless tobacco: Never  Vaping Use   Vaping status: Never Used  Substance and Sexual Activity   Alcohol use: Not Currently    Comment: rarely   Drug use: Never   Sexual activity: Not Currently    Birth control/protection: None  Other Topics  Concern   Not on file  Social History Narrative      Works for [employer], has Associate Degree   3 children   Right handed    Social Determinants of Health   Financial Resource Strain: Low Risk  (07/20/2022)   Overall Financial Resource Strain (CARDIA)    Difficulty of Paying Living Expenses: Not hard at all  Food Insecurity: No Food Insecurity (07/20/2022)   Hunger Vital Sign    Worried About Running Out of Food in the Last Year: Never true    Ran Out of Food in the Last Year: Never true  Transportation Needs: No Transportation Needs (07/20/2022)   PRAPARE - Administrator, Civil Service (Medical): No    Lack of Transportation (Non-Medical): No  Physical Activity: Unknown (07/20/2022)   Exercise Vital Sign    Days of Exercise per Week: 0 days    Minutes of Exercise per Session: Not on file  Stress: No Stress Concern Present (07/20/2022)   [employer] of Occupational Health - Occupational Stress Questionnaire    Feeling of Stress : Only a little  Social Connections: Unknown (07/20/2022)   Social Connection and Isolation Panel [NHANES]    Frequency of Communication with Friends and Family: Once a week    Frequency of Social Gatherings with Friends and Family: Patient declined    Attends Religious Services: Never    Database administrator or Organizations: No    Attends Engineer, structural: Not on file    Marital Status: Divorced    Allergies:  Allergies  Allergen Reactions   Doxycycline Rash and Swelling    Mouth and face   Erythromycin Shortness Of Breath   Sulfa Antibiotics Shortness Of Breath   Emsam [Selegiline] Dermatitis    Allergic reaction to patch   Saxenda [Liraglutide -Weight Management] Nausea Only   Penicillins Rash    Metabolic Disorder Labs: Lab Results  Component Value Date   HGBA1C 5.4 05/27/2021   Lab Results  Component Value Date   PROLACTIN 3.6 (L) 05/27/2021   Lab Results  Component Value Date   CHOL 225 (H)  03/31/2021   TRIG 154.0 (H) 03/31/2021   HDL 54.10 03/31/2021   CHOLHDL 4 03/31/2021   VLDL 30.8 03/31/2021   LDLCALC 140 (H) 03/31/2021   LDLCALC 153 (H) 10/31/2019   Lab Results  Component Value Date   TSH 2.000 05/27/2021   TSH 2.32 03/27/2019  Therapeutic Level Labs: No results found for: "LITHIUM" No results found for: "VALPROATE" No results found for: "CBMZ"  Current Medications: Current Outpatient Medications  Medication Sig Dispense Refill   EPINEPHrine (EPIPEN 2-PAK) 0.3 mg/0.3 mL IJ SOAJ injection Inject 0.3 mLs (0.3 mg total) into the muscle as needed for anaphylaxis. 1 each 1   fluticasone (FLONASE) 50 MCG/ACT nasal spray Place 2 sprays into both nostrils daily as needed for allergies or rhinitis. 16 g 5   hydrOXYzine (VISTARIL) 25 MG capsule Take 1-2 capsules (25-50 mg total) by mouth at bedtime as needed. For sleep 60 capsule 1   ipratropium (ATROVENT) 0.03 % nasal spray Place 1 spray into both nostrils 4 (four) times daily. Use as directed. 30 mL 5   levocetirizine (XYZAL) 5 MG tablet Take 1 tablet (5 mg total) by mouth every evening. 30 tablet 5   losartan-hydrochlorothiazide (HYZAAR) 50-12.5 MG tablet TAKE 1 TABLET BY MOUTH DAILY 90 tablet 0   mesalamine (LIALDA) 1.2 g EC tablet TAKE FOUR TABLETS BY MOUTH DAILY WITH BREAKFAST 120 tablet 11   propranolol (INDERAL) 10 MG tablet Take 1 tablet (10 mg total) by mouth 3 (three) times daily as needed. For anxiety 90 tablet 1   cariprazine (VRAYLAR) 1.5 MG capsule Take 1 capsule (1.5 mg total) by mouth daily. Stop Abilify 90 capsule 1   sertraline (ZOLOFT) 100 MG tablet Take 1 tablet (100 mg total) by mouth daily with breakfast. 90 tablet 1   No current facility-administered medications for this visit.     Musculoskeletal: Strength & Muscle Tone: within normal limits Gait & Station: normal Patient leans: N/A  Psychiatric Specialty Exam: Review of Systems  Psychiatric/Behavioral: Negative.      Blood pressure  136/89, pulse 89, temperature 97.8 F (36.6 C), temperature source Skin, height 5\' 7"  (1.702 m), weight (!) 308 lb 12.8 oz (140.1 kg).Body mass index is 48.36 kg/m.  General Appearance: Fairly Groomed  Eye Contact:  Fair  Speech:  Clear and Coherent  Volume:  Normal  Mood:  Euthymic  Affect:  Full Range  Thought Process:  Goal Directed and Descriptions of Associations: Intact  Orientation:  Full (Time, Place, and Person)  Thought Content: Logical   Suicidal Thoughts:  No  Homicidal Thoughts:  No  Memory:  Immediate;   Fair Recent;   Fair Remote;   Fair  Judgement:  Fair  Insight:  Fair  Psychomotor Activity:  Normal  Concentration:  Concentration: Fair and Attention Span: Fair  Recall:  [employer] of Knowledge: Fair  Language: Fair  Akathisia:  No  Handed:  Right  AIMS (if indicated): done  Assets:  Communication Skills Desire for Improvement Housing Social Support  ADL's:  Intact  Cognition: WNL  Sleep:  Fair   Screenings: Midwife Visit from 09/02/2022 in [facility] Office Visit from 06/01/2022 in [facility] Office Visit from 03/04/2022 in [facility] Office Visit from 11/06/2021 in [facility] Office Visit from 10/01/2021 in [facility]  AIMS Total Score 0 0 0 0 0      GAD-7    Flowsheet Row Office Visit from 09/02/2022 in [facility] Office Visit from 07/21/2022 in [facility] from 06/01/2022 in [facility] Office Visit from 03/04/2022 in [facility] Office  Visit from 01/22/2022 in [facility]  Total GAD-7 Score 4 5 0 3 3      PHQ2-9    Flowsheet Row  Office Visit from 09/02/2022 in [facility] Office Visit from 07/21/2022 in [facility] Visit from 06/01/2022 in [facility] Office Visit from 03/04/2022 in [facility] Office Visit from 01/22/2022 in [facility]  PHQ-2 Total Score 2 2 2 3 6   PHQ-9 Total Score 7 7 7 12 13       Flowsheet Row Office Visit from 09/02/2022 in [facility] Office Visit from 06/01/2022 in [facility] Video Visit from 04/29/2022 in [facility]  C-SSRS RISK CATEGORY No Risk No Risk Low Risk        Assessment and Plan: TANGALA TIERRABLANCA is a 57 year old Caucasian female, employed, lives in [place], separated, has a history of depression, binge eating disorder, obstructive sleep apnea on CPAP, was evaluated in office today.  Patient is currently stable.  Plan MDD in remission Zoloft 100 mg p.o. daily with breakfast Vraylar 1.5 mg p.o. daily Hydroxyzine 25 -50 mg p.o. nightly as needed Continue CBT/DBT  GAD-improving Zoloft 100 mg p.o. daily. Propranolol 10 mg p.o. 3 times daily as needed Hydroxyzine 25 to 50 mg at bedtime as needed.  Binge eating disorder-including Continue CBT Currently not on Vyvanse.  Patient with history of blood pressure elevation on stimulants.  Borderline personality disorder-improving Continue DBT/CBT with [facility].  Patient will benefit from labs including lipid panel, hemoglobin A1c, prolactin level.  Patient has upcoming appointment for her annual physical exam and agrees to get this done.  Otherwise will order at her next visit.  Follow-up in clinic in 3 to 4 months or sooner if needed.   Collaboration of Care: Collaboration of Care: Primary Care Provider AEB  patient to follow up with primary care provider for annual physical exam.  Patient/Guardian was advised Release of Information must be obtained prior to any record release in order to collaborate their care with an outside provider. Patient/Guardian was advised if they have not already done so to contact the registration department to sign all necessary forms in order for [name] to release information regarding their care.   Consent: Patient/Guardian gives verbal consent for treatment and assignment of benefits for services provided during this visit. Patient/Guardian expressed understanding and agreed to proceed.   This note was generated in part or whole with voice recognition software. Voice recognition is usually quite accurate but there are transcription errors that can and very often do occur. I apologize for any typographical errors that were not detected and corrected.    Jomarie Longs, MD 09/02/2022, 9:04 AM

## 2022-10-12 DIAGNOSIS — M722 Plantar fascial fibromatosis: Secondary | ICD-10-CM | POA: Insufficient documentation

## 2022-10-12 DIAGNOSIS — M6289 Other specified disorders of muscle: Secondary | ICD-10-CM | POA: Insufficient documentation

## 2022-11-24 ENCOUNTER — Other Ambulatory Visit: Payer: Self-pay | Admitting: Physician Assistant

## 2022-11-30 ENCOUNTER — Encounter: Payer: Self-pay | Admitting: Physician Assistant

## 2022-11-30 ENCOUNTER — Ambulatory Visit: Payer: No Typology Code available for payment source | Admitting: Physician Assistant

## 2022-11-30 VITALS — BP 130/86 | HR 81 | Temp 97.8°F | Ht 67.0 in | Wt 314.5 lb

## 2022-11-30 DIAGNOSIS — L0291 Cutaneous abscess, unspecified: Secondary | ICD-10-CM

## 2022-11-30 DIAGNOSIS — E669 Obesity, unspecified: Secondary | ICD-10-CM | POA: Diagnosis not present

## 2022-11-30 DIAGNOSIS — Z6841 Body Mass Index (BMI) 40.0 and over, adult: Secondary | ICD-10-CM | POA: Diagnosis not present

## 2022-11-30 DIAGNOSIS — Z23 Encounter for immunization: Secondary | ICD-10-CM

## 2022-11-30 DIAGNOSIS — E782 Mixed hyperlipidemia: Secondary | ICD-10-CM

## 2022-11-30 DIAGNOSIS — Z0000 Encounter for general adult medical examination without abnormal findings: Secondary | ICD-10-CM | POA: Diagnosis not present

## 2022-11-30 MED ORDER — MUPIROCIN 2 % EX OINT: 1.0000 | TOPICAL_OINTMENT | Freq: Two times a day (BID) | CUTANEOUS | 0 refills | Status: DC

## 2022-11-30 NOTE — Patient Instructions (Signed)
It was great to see you! ? ?Please go to the lab for blood work.  ? ?Our office will call you with your results unless you have chosen to receive results via MyChart. ? ?If your blood work is normal we will follow-up each year for physicals and as scheduled for chronic medical problems. ? ?If anything is abnormal we will treat accordingly and get you in for a follow-up. ? ?Take care, ? ?Jerrin Recore ?  ? ? ?

## 2022-11-30 NOTE — Progress Notes (Signed)
Leslie Douglas is a 57 y.o. female and is here for a comprehensive physical exam.  HPI  Acute Concerns: Lesion (Left Breast): Reports that she had a minor lesion on her left breast that she mechanically drained and cleaned with Hydrogen Peroxide. Denies fevers, chills  Chronic Issues: Hypertension: Managed/Compliant with Losartan-Hydrochlorothiazide 50-12.5 mg. Tolerating, no concerns. Not monitored at home. Denies excessive caffeine intake, stimulant usage, excessive alcohol intake, or increase in salt consumption.  Denies chest pain, shortness of breath, blurred vision, dizziness, unusual headaches, or lower leg swelling.  BP Readings from Last 3 Encounters:  11/30/22 130/86  08/03/22 122/78  08/03/22 112/66   Lymphocytic Colitis: Managed with Lialda. Followed by Lemar Lofty, MD with GI.    Request testing of kidney function.  Health Maintenance: Immunizations -- UTD: Received flu vaccine today. Colonoscopy/Endoscopy -- Last done 04/29/21. Hemorrhoids found on digital rectal exam; Granularity in the rectum and in the recto-sigmoid colon; Surveillance IBD biopsies were obtained from the ileum; Non-bleeding non-thrombosed internal hemorrhoids found. Repeat 2025.  Mammogram -- Last done 03/29/22. Results were normal. Repeat 2025. PAP -- Last done 03/31/21. Results were normal. Repeat 2028. Bone Density -- N/A Diet -- Healthy overall Exercise -- Regular exercise  Sleep habits -- Good sleep quality Mood -- Stable -- managed by psychiatry  UTD with dentist? - Yes UTD with eye doctor? - Yes Established/UTD with dermatology? - Yes, biannually.   Weight history: Wt Readings from Last 10 Encounters:  11/30/22 (!) 314 lb 8 oz (142.7 kg)  08/03/22 (!) 307 lb 9.6 oz (139.5 kg)  08/03/22 (!) 306 lb (138.8 kg)  07/21/22 (!) 303 lb 4 oz (137.6 kg)  03/08/22 297 lb 9.6 oz (135 kg)  01/21/22 295 lb 8 oz (134 kg)  12/30/21 299 lb 0.6 oz (135.6 kg)  10/20/21 296 lb (134.3  kg)  08/10/21 297 lb 4 oz (134.8 kg)  07/22/21 295 lb (133.8 kg)   Body mass index is 49.26 kg/m. No LMP recorded. Patient is postmenopausal.  Alcohol use:  reports that she does not currently use alcohol.  Tobacco use:  Tobacco Use: Low Risk  (11/30/2022)   Patient History    Smoking Tobacco Use: Never    Smokeless Tobacco Use: Never    Passive Exposure: Not on file   Eligible for lung cancer screening? no     11/30/2022    2:27 PM  Depression screen PHQ 2/9  Decreased Interest 2  Down, Depressed, Hopeless 2  PHQ - 2 Score 4  Altered sleeping 0  Tired, decreased energy 1  Change in appetite 2  Feeling bad or failure about yourself  2  Trouble concentrating 2  Moving slowly or fidgety/restless 0  Suicidal thoughts 0  PHQ-9 Score 11  Difficult doing work/chores Very difficult   Other providers/specialists: Patient Care Team: Jarold Motto, [place] as PCP - General (Physician Assistant) Alfonse Spruce, MD as Consulting Physician (Allergy and Immunology) Mansouraty, Netty Starring., MD as Consulting Physician (Gastroenterology) Van Clines, MD as Consulting Physician (Neurology)   PMHx, SurgHx, SocialHx, Medications, and Allergies were reviewed in the Visit Navigator and updated as appropriate.   Past Medical History:  Diagnosis Date   Allergy    Anxiety    Arthritis    Back pain    Binge eating disorder    Borderline personality disorder (HCC)    COPD (chronic obstructive pulmonary disease) (HCC)    chronic bronchitis   Depression    Edema, lower extremity  Hip pain    History of chicken pox    Joint pain    Memory loss    Pancolitis (HCC)    Poor concentration    Seasonal allergies    Skin cancer, basal cell    basal cell   Tinnitus    Vitamin D deficiency    Past Surgical History:  Procedure Laterality Date   CHOLECYSTECTOMY  2003   MOHS SURGERY  10/2017   SKIN SURGERY  05/2020   nose skin cancer   VAGINAL DELIVERY  1995   WISDOM  TOOTH EXTRACTION  12/2017   Family History  Problem Relation Age of Onset   Hypertension Mother    Diabetes Father    Colon cancer Neg Hx    Esophageal cancer Neg Hx    Rectal cancer Neg Hx    Stomach cancer Neg Hx    Pancreatic cancer Neg Hx    Inflammatory bowel disease Neg Hx    Liver disease Neg Hx    Allergic rhinitis Neg Hx    Angioedema Neg Hx    Asthma Neg Hx    Eczema Neg Hx    Urticaria Neg Hx    Immunodeficiency Neg Hx    Atopy Neg Hx    Mental illness Neg Hx    Sleep apnea Neg Hx    Social History   Tobacco Use   Smoking status: Never   Smokeless tobacco: Never  Vaping Use   Vaping status: Never Used  Substance Use Topics   Alcohol use: Not Currently    Comment: seldom (alcohol intolerance?)   Drug use: Never   Review of Systems:   Review of Systems  Constitutional:  Negative for chills, fever, malaise/fatigue and weight loss.  HENT:  Negative for hearing loss, sinus pain and sore throat.   Respiratory:  Negative for cough and hemoptysis.   Cardiovascular:  Negative for chest pain, palpitations, leg swelling and PND.  Gastrointestinal:  Negative for abdominal pain, constipation, diarrhea, heartburn, nausea and vomiting.  Genitourinary:  Negative for dysuria, frequency and urgency.  Musculoskeletal:  Negative for back pain, myalgias and neck pain.  Skin:  Negative for itching and rash.  Neurological:  Negative for dizziness, tingling, seizures and headaches.  Endo/Heme/Allergies:  Negative for polydipsia.  Psychiatric/Behavioral:  Negative for depression. The patient is not nervous/anxious.       Objective:   BP 130/86 (BP Location: Left Arm, Patient Position: Sitting, Cuff Size: Large)   Pulse 81   Temp 97.8 F (36.6 C) (Temporal)   Ht 5\' 7"  (1.702 m)   Wt (!) 314 lb 8 oz (142.7 kg)   SpO2 97%   BMI 49.26 kg/m  Body mass index is 49.26 kg/m.   General Appearance:    Alert, cooperative, no distress, appears stated age  Head:     Normocephalic, without obvious abnormality, atraumatic  Eyes:    PERRL, conjunctiva/corneas clear, EOM's intact, fundi    benign, both eyes  Ears:    Normal TM's and external ear canals, both ears  Nose:   Nares normal, septum midline, mucosa normal, no drainage    or sinus tenderness  Throat:   Lips, mucosa, and tongue normal; teeth and gums normal  Neck:   Supple, symmetrical, trachea midline, no adenopathy;    thyroid:  no enlargement/tenderness/nodules; no carotid   bruit or JVD  Back:     Symmetric, no curvature, ROM normal, no CVA tenderness  Lungs:     Clear to auscultation bilaterally,  respirations unlabored  Chest Wall:    No tenderness or deformity   Heart:    Regular rate and rhythm, S1 and S2 normal, no murmur, rub or gallop  Breast Exam:    Deferred  Abdomen:     Soft, non-tender, bowel sounds active all four quadrants,    no masses, no organomegaly  Genitalia:    Deferred  Extremities:   Extremities normal, atraumatic, no cyanosis or edema  Pulses:   2+ and symmetric all extremities  Skin:   Skin color, texture, turgor normal, no rashes  Approximate 1 cm area of erythema to left upper inner area of breast without active drainage  Lymph nodes:   Cervical, supraclavicular, and axillary nodes normal  Neurologic:   CNII-XII intact, normal strength, sensation and reflexes    throughout    Assessment/Plan:   Routine physical examination Today patient counseled on age appropriate routine health concerns for screening and prevention, each reviewed and up to date or declined. Immunizations reviewed and up to date or declined. Labs ordered and reviewed. Risk factors for depression reviewed and negative. Hearing function and visual acuity are intact. ADLs screened and addressed as needed. Functional ability and level of safety reviewed and appropriate. Education, counseling and referrals performed based on assessed risks today. Patient provided with a copy of personalized plan for  preventive services.  Obesity, unspecified class, unspecified obesity type, unspecified whether serious comorbidity present Continue efforts at healthy lifestyle  Mixed hyperlipidemia Update lipid panel and provide recommendations  Abscess No red flags - appears to be improving well Recommend topical bactroban to area Follow-up if any concerns  Need for immunization against influenza Updated today  I,Emily Lagle,acting as a scribe for [employer], PA.,have documented all relevant documentation on the behalf of Jarold Motto, PA,as directed by  Jarold Motto, PA while in the presence of Jarold Motto, [place].  I, Jarold Motto, [place], have reviewed all documentation for this visit. The documentation on 11/30/22 for the exam, diagnosis, procedures, and orders are all accurate and complete.  Jarold Motto, PA-C [place] [facility] [address]

## 2022-12-01 LAB — COMPREHENSIVE METABOLIC PANEL
ALT: 38 U/L — ABNORMAL HIGH (ref 0–35)
AST: 31 U/L (ref 0–37)
Albumin: 4.2 g/dL (ref 3.5–5.2)
Alkaline Phosphatase: 53 U/L (ref 39–117)
BUN: 21 mg/dL (ref 6–23)
CO2: 29 meq/L (ref 19–32)
Calcium: 9.4 mg/dL (ref 8.4–10.5)
Chloride: 102 meq/L (ref 96–112)
Creatinine, Ser: 0.81 mg/dL (ref 0.40–1.20)
GFR: 79.41 mL/min (ref >60.00)
Glucose, Bld: 73 mg/dL (ref 70–99)
Potassium: 4 meq/L (ref 3.5–5.1)
Sodium: 138 meq/L (ref 135–145)
Total Bilirubin: 0.4 mg/dL (ref 0.2–1.2)
Total Protein: 7.1 g/dL (ref 6.0–8.3)

## 2022-12-01 LAB — CBC WITH DIFFERENTIAL/PLATELET
Basophils Absolute: 0.1 10*3/uL (ref 0.0–0.1)
Basophils Relative: 1 % (ref 0.0–3.0)
Eosinophils Absolute: 0.1 10*3/uL (ref 0.0–0.7)
Eosinophils Relative: 2 % (ref 0.0–5.0)
HCT: 40.1 % (ref 36.0–46.0)
Hemoglobin: 13.2 g/dL (ref 12.0–15.0)
Lymphocytes Relative: 41.3 % (ref 12.0–46.0)
Lymphs Abs: 2.9 10*3/uL (ref 0.7–4.0)
MCHC: 33.1 g/dL (ref 30.0–36.0)
MCV: 92.5 fL (ref 78.0–100.0)
Monocytes Absolute: 0.4 10*3/uL (ref 0.1–1.0)
Monocytes Relative: 5.9 % (ref 3.0–12.0)
Neutro Abs: 3.5 10*3/uL (ref 1.4–7.7)
Neutrophils Relative %: 49.8 % (ref 43.0–77.0)
Platelets: 341 10*3/uL (ref 150.0–400.0)
RBC: 4.34 Mil/uL (ref 3.87–5.11)
RDW: 14 % (ref 11.5–15.5)
WBC: 7.1 10*3/uL (ref 4.0–10.5)

## 2022-12-01 LAB — LIPID PANEL
Cholesterol: 260 mg/dL — ABNORMAL HIGH (ref 0–200)
HDL: 51.9 mg/dL (ref >39.00)
LDL Cholesterol: 158 mg/dL — ABNORMAL HIGH (ref 0–99)
NonHDL: 207.66
Total CHOL/HDL Ratio: 5
Triglycerides: 250 mg/dL — ABNORMAL HIGH (ref 0.0–149.0)
VLDL: 50 mg/dL — ABNORMAL HIGH (ref 0.0–40.0)

## 2022-12-30 ENCOUNTER — Telehealth: Payer: No Typology Code available for payment source | Admitting: Psychiatry

## 2022-12-30 ENCOUNTER — Encounter: Payer: Self-pay | Admitting: Psychiatry

## 2022-12-30 DIAGNOSIS — Z9189 Other specified personal risk factors, not elsewhere classified: Secondary | ICD-10-CM

## 2022-12-30 DIAGNOSIS — F411 Generalized anxiety disorder: Secondary | ICD-10-CM

## 2022-12-30 DIAGNOSIS — F603 Borderline personality disorder: Secondary | ICD-10-CM

## 2022-12-30 DIAGNOSIS — F5081 Binge eating disorder, mild: Secondary | ICD-10-CM | POA: Diagnosis not present

## 2022-12-30 DIAGNOSIS — G4733 Obstructive sleep apnea (adult) (pediatric): Secondary | ICD-10-CM

## 2022-12-30 DIAGNOSIS — F331 Major depressive disorder, recurrent, moderate: Secondary | ICD-10-CM | POA: Diagnosis not present

## 2022-12-30 MED ORDER — CARIPRAZINE HCL 3 MG PO CAPS: 3.0000 mg | ORAL_CAPSULE | Freq: Every day | ORAL | 0 refills | Status: DC

## 2022-12-30 NOTE — Progress Notes (Signed)
Virtual Visit via Video Note  I connected with Leslie Douglas on 12/30/22 at  3:00 PM EST by a video enabled telemedicine application and verified that I am speaking with the correct person using two identifiers.  Location Provider Location : [facility] Patient Location : Home  Participants: Patient , Provider    I discussed the limitations of evaluation and management by telemedicine and the availability of in person appointments. The patient expressed understanding and agreed to proceed.    I discussed the assessment and treatment plan with the patient. The patient was provided an opportunity to ask questions and all were answered. The patient agreed with the plan and demonstrated an understanding of the instructions.   The patient was advised to call back or seek an in-person evaluation if the symptoms worsen or if the condition fails to improve as anticipated.   BH MD OP Progress Note  12/31/2022 8:18 AM Leslie Douglas  MRN:  [id number]  Chief Complaint:  Chief Complaint  Patient presents with   Follow-up   Depression   Anxiety   Medication Refill   HPI: Leslie Douglas is a 57 year old Caucasian female, employed, separated, lives in [place], has a history of MDD, GAD, binge eating disorder, borderline personality disorder was evaluated by telemedicine today.  The patient reports a recent downturn in mood. She describes symptoms of depression including lack of motivation, difficulty with personal hygiene, and a general feeling of being down. These symptoms have been more prominent when she is alone, but have improved since her son came home for the holidays. The patient denies any suicidal ideation or self-harming behaviors.  The patient also reports ongoing struggles with binge eating, although she notes that the episodes are not as severe as they have been in the past. She continues to overeat, but does not classify these episodes as binges.  Regarding her sleep apnea,  the patient has stopped using her CPAP machine due to discomfort and disruption of sleep. She reports sleeping better without the machine, but acknowledges the need to address this issue with her primary care provider due to potential complications of untreated sleep apnea.  The patient has been on sertraline 100mg  and Vraylar 1.5mg  for depression, and hydroxyzine 25-50mg  at bedtime and propranolol 10mg  three times a day as needed for anxiety. She stopped taking Vyvanse due to elevated blood pressure and cost. She reports no side effects from her current medications, but has noticed some weight gain which she attributes more to her eating habits than her medication.  The patient's depression symptoms were last prominent about a week ago, but have improved since her son's arrival. She is open to increasing the dosage of her current medications to better manage her depressive symptoms.    Visit Diagnosis:    ICD-10-CM   1. MDD (major depressive disorder), recurrent episode, moderate (HCC)  F33.1 cariprazine (VRAYLAR) 3 MG capsule    2. GAD (generalized anxiety disorder)  F41.1     3. Mild binge-eating disorder  F50.810     4. Borderline personality disorder (HCC)  F60.3     5. OSA (obstructive sleep apnea)  G47.33     6. At risk for prolonged QT interval syndrome  Z91.89 EKG 12-Lead      Past Psychiatric History: I have reviewed past psychiatric history from progress note on 05/19/2021.  Past Medical History:  Past Medical History:  Diagnosis Date   Allergy    Anxiety    Arthritis    Back  pain    Binge eating disorder    Borderline personality disorder (HCC)    COPD (chronic obstructive pulmonary disease) (HCC)    chronic bronchitis   Depression    Edema, lower extremity    Hip pain    History of chicken pox    Joint pain    Memory loss    Pancolitis (HCC)    Poor concentration    Seasonal allergies    Skin cancer, basal cell    basal cell   Tinnitus    Vitamin D  deficiency     Past Surgical History:  Procedure Laterality Date   CHOLECYSTECTOMY  2003   MOHS SURGERY  10/2017   SKIN SURGERY  05/2020   nose skin cancer   VAGINAL DELIVERY  1995   WISDOM TOOTH EXTRACTION  12/2017    Family Psychiatric History: I have reviewed family psychiatric history from progress note on 05/19/2021.  Family History:  Family History  Problem Relation Age of Onset   Hypertension Mother    Diabetes Father    Colon cancer Neg Hx    Esophageal cancer Neg Hx    Rectal cancer Neg Hx    Stomach cancer Neg Hx    Pancreatic cancer Neg Hx    Inflammatory bowel disease Neg Hx    Liver disease Neg Hx    Allergic rhinitis Neg Hx    Angioedema Neg Hx    Asthma Neg Hx    Eczema Neg Hx    Urticaria Neg Hx    Immunodeficiency Neg Hx    Atopy Neg Hx    Mental illness Neg Hx    Sleep apnea Neg Hx     Social History: I have reviewed social history from progress note on 05/19/2021. Social History   Socioeconomic History   Marital status: Divorced    Spouse name: Not on file   Number of children: 3   Years of education: Not on file   Highest education level: Associate degree: academic program  Occupational History   Occupation: Aeronautical engineer    Comment: [employer]  Tobacco Use   Smoking status: Never   Smokeless tobacco: Never  Vaping Use   Vaping status: Never Used  Substance and Sexual Activity   Alcohol use: Not Currently    Comment: seldom (alcohol intolerance?)   Drug use: Never   Sexual activity: Not Currently    Birth control/protection: Abstinence  Other Topics Concern   Not on file  Social History Narrative   Works for [employer], has Associate Degree   3 children   Right handed    Social Drivers of Corporate investment banker Strain: Low Risk  (07/20/2022)   Overall Financial Resource Strain (CARDIA)    Difficulty of Paying Living Expenses: Not hard at all  Food Insecurity: No Food Insecurity (07/20/2022)   Hunger Vital Sign     Worried About Running Out of Food in the Last Year: Never true    Ran Out of Food in the Last Year: Never true  Transportation Needs: No Transportation Needs (07/20/2022)   PRAPARE - Administrator, Civil Service (Medical): No    Lack of Transportation (Non-Medical): No  Physical Activity: Unknown (07/20/2022)   Exercise Vital Sign    Days of Exercise per Week: 0 days    Minutes of Exercise per Session: Not on file  Stress: No Stress Concern Present (07/20/2022)   [employer] of Occupational Health - Occupational Stress Questionnaire  Feeling of Stress : Only a little  Social Connections: Unknown (07/20/2022)   Social Connection and Isolation Panel [NHANES]    Frequency of Communication with Friends and Family: Once a week    Frequency of Social Gatherings with Friends and Family: Patient declined    Attends Religious Services: Never    Database administrator or Organizations: No    Attends Engineer, structural: Not on file    Marital Status: Divorced    Allergies:  Allergies  Allergen Reactions   Doxycycline Rash and Swelling    Mouth and face   Erythromycin Shortness Of Breath   Molds & Smuts Rash   Sulfa Antibiotics Shortness Of Breath   Emsam [Selegiline] Dermatitis    Allergic reaction to patch   Saxenda [Liraglutide -Weight Management] Nausea Only   Penicillins Rash    Metabolic Disorder Labs: Lab Results  Component Value Date   HGBA1C 5.4 05/27/2021   Lab Results  Component Value Date   PROLACTIN 3.6 (L) 05/27/2021   Lab Results  Component Value Date   CHOL 260 (H) 11/30/2022   TRIG 250.0 (H) 11/30/2022   HDL 51.90 11/30/2022   CHOLHDL 5 11/30/2022   VLDL 50.0 (H) 11/30/2022   LDLCALC 158 (H) 11/30/2022   LDLCALC 140 (H) 03/31/2021   Lab Results  Component Value Date   TSH 2.000 05/27/2021   TSH 2.32 03/27/2019    Therapeutic Level Labs: No results found for: "LITHIUM" No results found for: "VALPROATE" No results found for:  "CBMZ"  Current Medications: Current Outpatient Medications  Medication Sig Dispense Refill   cariprazine (VRAYLAR) 3 MG capsule Take 1 capsule (3 mg total) by mouth daily. 90 capsule 0   celecoxib (CELEBREX) 200 MG capsule Take 200 mg by mouth daily.     diclofenac Sodium (VOLTAREN) 1 % GEL Apply 2 g topically as needed.     EPINEPHrine (EPIPEN 2-PAK) 0.3 mg/0.3 mL IJ SOAJ injection Inject 0.3 mLs (0.3 mg total) into the muscle as needed for anaphylaxis. 1 each 1   fluticasone (FLONASE) 50 MCG/ACT nasal spray Place 2 sprays into both nostrils daily as needed for allergies or rhinitis. 16 g 5   hydrOXYzine (VISTARIL) 25 MG capsule Take 1-2 capsules (25-50 mg total) by mouth at bedtime as needed. For sleep 60 capsule 1   ipratropium (ATROVENT) 0.03 % nasal spray Place 1 spray into both nostrils 4 (four) times daily. Use as directed. 30 mL 5   levocetirizine (XYZAL) 5 MG tablet Take 1 tablet (5 mg total) by mouth every evening. 30 tablet 5   losartan-hydrochlorothiazide (HYZAAR) 50-12.5 MG tablet TAKE 1 TABLET BY MOUTH DAILY 90 tablet 0   mesalamine (LIALDA) 1.2 g EC tablet TAKE FOUR TABLETS BY MOUTH DAILY WITH BREAKFAST 120 tablet 11   mupirocin ointment (BACTROBAN) 2 % Apply 1 Application topically 2 (two) times daily. 22 g 0   propranolol (INDERAL) 10 MG tablet Take 1 tablet (10 mg total) by mouth 3 (three) times daily as needed. For anxiety 90 tablet 1   sertraline (ZOLOFT) 100 MG tablet Take 1 tablet (100 mg total) by mouth daily with breakfast. 90 tablet 1   No current facility-administered medications for this visit.     Musculoskeletal: Strength & Muscle Tone:  UTA Gait & Station:  Seated Patient leans: N/A  Psychiatric Specialty Exam: Review of Systems  Psychiatric/Behavioral:  Positive for dysphoric mood.     There were no vitals taken for this visit.There is no height  or weight on file to calculate BMI.  General Appearance: Casual  Eye Contact:  Fair  Speech:  Clear and  Coherent  Volume:  Normal  Mood:  Depressed  Affect:  Congruent  Thought Process:  Goal Directed and Descriptions of Associations: Intact  Orientation:  Full (Time, Place, and Person)  Thought Content: Logical   Suicidal Thoughts:  No  Homicidal Thoughts:  No  Memory:  Immediate;   Fair Recent;   Fair Remote;   Fair  Judgement:  Fair  Insight:  Fair  Psychomotor Activity:  Normal  Concentration:  Concentration: Fair and Attention Span: Fair  Recall:  [employer] of Knowledge: Fair  Language: Fair  Akathisia:  No  Handed:  Right  AIMS (if indicated): not done  Assets:  Communication Skills Desire for Improvement Housing Social Support  ADL's:  Intact  Cognition: WNL  Sleep:   improving   Screenings: Geneticist, molecular Office Visit from 09/02/2022 in [facility] Office Visit from 06/01/2022 in [facility] Office Visit from 03/04/2022 in [facility] Office Visit from 11/06/2021 in [facility] Office Visit from 10/01/2021 in [facility]  AIMS Total Score 0 0 0 0 0      GAD-7    Flowsheet Row Office Visit from 11/30/2022 in [facility] [employer] from 09/02/2022 in [facility] Office Visit from 07/21/2022 in [facility] [employer] Visit from 06/01/2022 in [facility] Office Visit from 03/04/2022 in [facility]  Total GAD-7 Score 5 4 5  0 3      PHQ2-9    Flowsheet Row Office Visit from 11/30/2022 in [facility] [place] Office Visit from 09/02/2022 in [facility] Office Visit from 07/21/2022 in [facility] [place] Office Visit from 06/01/2022 in [facility] Office Visit from 03/04/2022 in [facility]  PHQ-2 Total Score 4 2 2 2 3   PHQ-9 Total Score 11 7 7 7 12       Flowsheet Row Video Visit from 12/30/2022 in [facility] Office Visit from 09/02/2022 in [facility] Office Visit from 06/01/2022 in [facility]  C-SSRS RISK CATEGORY No Risk No Risk No Risk        Assessment and Plan: Leslie Douglas is a 57 year old Caucasian female, employed, lives in [place], separated, has a history of depression, binge eating disorder, OSA currently not on CPAP, with worsening depression symptoms, discussed assessment and plan as noted below.  Major Depressive Disorder-unstable Major depressive disorder with recent exacerbation, possibly due to seasonal affective disorder. Symptoms include lack of motivation, difficulty with daily activities, and low mood. No suicidal ideation or self-injurious behavior reported. Current medications include sertraline 100 mg and Vraylar 1.5 mg. Patient reports improvement with the presence of her son but symptoms persist when alone. Discussed increasing Vraylar to 3 mg daily. Informed about potential side effects including weight gain, abnormal involuntary movements, and tardive dyskinesia. Advised to monitor for any cardiac symptoms due to Vraylar's potential effects on the heart. EKG to be repeated in 1-2 weeks to monitor for cardiac effects. -  Increase Vraylar to 3 mg daily - Order EKG in 1-2 weeks to monitor for cardiac effects - Monitor for side effects such as weight gain, abnormal involuntary movements, and tardive dyskinesia - Schedule follow-up appointment in 4 weeks  Generalized Anxiety Disorder-stable Generalized anxiety disorder is well-managed  with sertraline and propranolol. No recent changes in symptoms reported. - Continue sertraline 100 mg daily and propranolol 10 mg three times a day as needed  Binge Eating Disorder-improving Binge eating disorder with ongoing symptoms of overeating but not full binge episodes. Long history of this condition. - Monitor eating habits - Discuss potential treatment options if symptoms worsen  Borderline Personality Disorder-improving Borderline personality disorder. No specific symptoms or concerns discussed during this visit. - Continue current management and monitoring  Obstructive Sleep Apnea-unstable Obstructive sleep apnea with non-compliance to CPAP therapy due to discomfort and mask issues. Patient reports better sleep without CPAP but is advised to address this with her primary care provider due to potential complications. - Discuss CPAP issues with primary care provider to find a solution  At risk for prolonged QT syndrome-we will order EKG.  Patient to call [phone].  Follow-up - Schedule follow-up appointment in 4 weeks - Next visit to be in-office on February 6th at 11:40 AM - Monitor for any side effects from medication changes and report immediately if any occur.   Collaboration of Care: Collaboration of Care: Other patient encouraged to follow up with sleep specialist/primary care provider regarding CPAP problem.  Patient/Guardian was advised Release of Information must be obtained prior to any record release in order to collaborate their care with an outside provider. Patient/Guardian was advised if they have not already done so to contact the registration department to sign all necessary forms in order for [name] to release information regarding their care.   Consent: Patient/Guardian gives verbal consent for treatment and assignment of benefits for services provided during this visit. Patient/Guardian expressed understanding and agreed to proceed.   This note was generated  in part or whole with voice recognition software. Voice recognition is usually quite accurate but there are transcription errors that can and very often do occur. I apologize for any typographical errors that were not detected and corrected.    Jomarie Longs, MD 12/31/2022, 8:18 AM

## 2022-12-30 NOTE — Patient Instructions (Signed)
Please call for EKG-[phone] 

## 2023-01-27 ENCOUNTER — Ambulatory Visit
Admission: RE | Admit: 2023-01-27 | Discharge: 2023-01-27 | Disposition: A | Discharge status: Home / Self Care | Payer: No Typology Code available for payment source | Source: Ambulatory Visit | Attending: Psychiatry | Admitting: Psychiatry

## 2023-01-27 DIAGNOSIS — Z9189 Other specified personal risk factors, not elsewhere classified: Secondary | ICD-10-CM | POA: Diagnosis present

## 2023-01-27 DIAGNOSIS — Z5181 Encounter for therapeutic drug level monitoring: Secondary | ICD-10-CM | POA: Diagnosis not present

## 2023-01-28 ENCOUNTER — Ambulatory Visit: Payer: No Typology Code available for payment source

## 2023-01-28 ENCOUNTER — Telehealth: Payer: Self-pay | Admitting: Psychiatry

## 2023-01-28 NOTE — Telephone Encounter (Signed)
Reviewed EKG dated 01/27/2023.  Left posterior fascicular block Cannot rule out Anterior infarct (cited on or before 29-May-2021) Abnormal ECG When compared with ECG of 06-May-2022 13:21, Left posterior fascicular block is now Present Left anterior fascicular block is no longer Present Questionable change in initial forces of Anterolateral leads   Will have staff contact patient to discuss changes with primary care provider.  Okay to continue current psychotropic medications at this time.

## 2023-02-02 ENCOUNTER — Encounter: Payer: Self-pay | Admitting: Physician Assistant

## 2023-02-02 ENCOUNTER — Ambulatory Visit: Payer: No Typology Code available for payment source | Admitting: Physician Assistant

## 2023-02-02 VITALS — BP 130/90 | HR 101 | Temp 97.3°F | Ht 67.0 in | Wt 314.0 lb

## 2023-02-02 DIAGNOSIS — I10 Essential (primary) hypertension: Secondary | ICD-10-CM | POA: Diagnosis not present

## 2023-02-02 DIAGNOSIS — E669 Obesity, unspecified: Secondary | ICD-10-CM | POA: Diagnosis not present

## 2023-02-02 DIAGNOSIS — R9431 Abnormal electrocardiogram [ECG] [EKG]: Secondary | ICD-10-CM | POA: Diagnosis not present

## 2023-02-02 DIAGNOSIS — R0602 Shortness of breath: Secondary | ICD-10-CM | POA: Diagnosis not present

## 2023-02-02 NOTE — Progress Notes (Signed)
Leslie Douglas is a 58 y.o. female here for a new problem.  History of Present Illness:   Chief Complaint  Patient presents with   EKG changes    HPI  EKG Changes // HTN She does complain of experiencing dizziness and occasional SOB.  Her Vraylar dose was increase per Dr. Elvera Maria recommendation.   EKG was obtained by psychiatry and she was told to "see PCP regarding EKG changes" Does experience one episode of palpations last fall that was resolved following deep breaths and relaxation.   She's currently taking Hyzaar 50-12.5 mg once daily.  She denies checking her BP at home. No other complains at this time.  Patient denies chest pain, blurred vision, unusual headaches, lower leg swelling. Patient is compliant with medication. Denies excessive caffeine intake, stimulant usage, excessive alcohol intake, or increase in salt  Weight Gain  She reports gaining about 50 pounds in the last several years.  She's currently not on any exercise or diet regimens.  Has not tried any GLP-1 medications due to insurance issues.   Past Medical History:  Diagnosis Date   Allergy    Anxiety    Arthritis    Back pain    Binge eating disorder    Borderline personality disorder (HCC)    COPD (chronic obstructive pulmonary disease) (HCC)    chronic bronchitis   Depression    Edema, lower extremity    Hip pain    History of chicken pox    Joint pain    Memory loss    Pancolitis (HCC)    Poor concentration    Seasonal allergies    Skin cancer, basal cell    basal cell   Tinnitus    Vitamin D deficiency      Social History   Tobacco Use   Smoking status: Never   Smokeless tobacco: Never  Vaping Use   Vaping status: Never Used  Substance Use Topics   Alcohol use: Not Currently    Comment: seldom (alcohol intolerance?)   Drug use: Never    Past Surgical History:  Procedure Laterality Date   CHOLECYSTECTOMY  2003   MOHS SURGERY  10/2017   SKIN SURGERY  05/2020   nose skin  cancer   VAGINAL DELIVERY  1995   WISDOM TOOTH EXTRACTION  12/2017    Family History  Problem Relation Age of Onset   Hypertension Mother    Diabetes Father    Colon cancer Neg Hx    Esophageal cancer Neg Hx    Rectal cancer Neg Hx    Stomach cancer Neg Hx    Pancreatic cancer Neg Hx    Inflammatory bowel disease Neg Hx    Liver disease Neg Hx    Allergic rhinitis Neg Hx    Angioedema Neg Hx    Asthma Neg Hx    Eczema Neg Hx    Urticaria Neg Hx    Immunodeficiency Neg Hx    Atopy Neg Hx    Mental illness Neg Hx    Sleep apnea Neg Hx     Allergies  Allergen Reactions   Doxycycline Rash and Swelling    Mouth and face   Erythromycin Shortness Of Breath   Molds & Smuts Rash   Sulfa Antibiotics Shortness Of Breath   Emsam [Selegiline] Dermatitis    Allergic reaction to patch   Saxenda [Liraglutide -Weight Management] Nausea Only   Penicillins Rash    Current Medications:   Current Outpatient Medications:  cariprazine (VRAYLAR) 3 MG capsule, Take 1 capsule (3 mg total) by mouth daily., Disp: 90 capsule, Rfl: 0   diclofenac Sodium (VOLTAREN) 1 % GEL, Apply 2 g topically as needed., Disp: , Rfl:    EPINEPHrine (EPIPEN 2-PAK) 0.3 mg/0.3 mL IJ SOAJ injection, Inject 0.3 mLs (0.3 mg total) into the muscle as needed for anaphylaxis., Disp: 1 each, Rfl: 1   fluticasone (FLONASE) 50 MCG/ACT nasal spray, Place 2 sprays into both nostrils daily as needed for allergies or rhinitis., Disp: 16 g, Rfl: 5   hydrOXYzine (VISTARIL) 25 MG capsule, Take 1-2 capsules (25-50 mg total) by mouth at bedtime as needed. For sleep, Disp: 60 capsule, Rfl: 1   ipratropium (ATROVENT) 0.03 % nasal spray, Place 1 spray into both nostrils 4 (four) times daily. Use as directed., Disp: 30 mL, Rfl: 5   levocetirizine (XYZAL) 5 MG tablet, Take 1 tablet (5 mg total) by mouth every evening., Disp: 30 tablet, Rfl: 5   losartan-hydrochlorothiazide (HYZAAR) 50-12.5 MG tablet, TAKE 1 TABLET BY MOUTH DAILY, Disp:  90 tablet, Rfl: 0   mesalamine (LIALDA) 1.2 g EC tablet, TAKE FOUR TABLETS BY MOUTH DAILY WITH BREAKFAST, Disp: 120 tablet, Rfl: 11   mupirocin ointment (BACTROBAN) 2 %, Apply 1 Application topically 2 (two) times daily., Disp: 22 g, Rfl: 0   propranolol (INDERAL) 10 MG tablet, Take 1 tablet (10 mg total) by mouth 3 (three) times daily as needed. For anxiety, Disp: 90 tablet, Rfl: 1   sertraline (ZOLOFT) 100 MG tablet, Take 1 tablet (100 mg total) by mouth daily with breakfast., Disp: 90 tablet, Rfl: 1   Review of Systems:   Review of Systems  Respiratory:  Positive for shortness of breath.   Cardiovascular:  Positive for palpitations.  Neurological:  Positive for dizziness.    Vitals:   Vitals:   02/02/23 1522 02/02/23 1535  BP: (!) 126/90 (!) 130/90  Pulse: (!) 101   Temp: (!) 97.3 F (36.3 C)   TempSrc: Temporal   SpO2: 98%   Weight: (!) 314 lb (142.4 kg)   Height: 5\' 7"  (1.702 m)      Body mass index is 49.18 kg/m.  Physical Exam:   Physical Exam Vitals and nursing note reviewed.  Constitutional:      General: She is not in acute distress.    Appearance: She is well-developed. She is not ill-appearing or toxic-appearing.  Cardiovascular:     Rate and Rhythm: Normal rate and regular rhythm.     Pulses: Normal pulses.     Heart sounds: Normal heart sounds, S1 normal and S2 normal.  Pulmonary:     Effort: Pulmonary effort is normal.     Breath sounds: Normal breath sounds.  Skin:    General: Skin is warm and dry.  Neurological:     Mental Status: She is alert.     GCS: GCS eye subscore is 4. GCS verbal subscore is 5. GCS motor subscore is 6.  Psychiatric:        Speech: Speech normal.        Behavior: Behavior normal. Behavior is cooperative.     Assessment and Plan:   SOB (shortness of breath); EKG abnormalities No obvious EKG changes when comparing last two EKGs in system Due to symptom(s) however, will refer to cardiology She is UpToDate on blood  work Does not seem to have any acute symptom(s) currently Continue to monitor  Primary hypertension Above goal today No evidence of end-organ damage on my  exam Recommend patient monitor home blood pressure at least a few times weekly Continue Hyzaar 50-12.5 mg once daily.  If home monitoring shows consistent elevation, or any symptom(s) develop, recommend reach out to [name] for further advice on next steps  Obesity Considering GLP-1 if insurance allows -- she will message if she wants this sent in Recommend cardiology evaluation and if assessment is positive, to start more regular activity  Jarold Motto, PA-C  I,Safa M Kadhim,acting as a scribe for [employer], PA.,have documented all relevant documentation on the behalf of Jarold Motto, PA,as directed by  Jarold Motto, PA while in the presence of Jarold Motto, [place].   I, Jarold Motto, [place], have reviewed all documentation for this visit. The documentation on 02/02/23 for the exam, diagnosis, procedures, and orders are all accurate and complete.

## 2023-02-02 NOTE — Telephone Encounter (Signed)
Thank you Val, sent a message also since it was the weekend .

## 2023-02-02 NOTE — Telephone Encounter (Signed)
Called patient to make aware that she needs to contact her PCP to discuss her EKG results she stated that Dr Elna Breslow had already sent her a MyChart message

## 2023-02-03 ENCOUNTER — Other Ambulatory Visit: Payer: Self-pay | Admitting: Gastroenterology

## 2023-02-04 ENCOUNTER — Telehealth: Payer: Self-pay | Admitting: Gastroenterology

## 2023-02-04 MED ORDER — MESALAMINE 1.2 G PO TBEC: DELAYED_RELEASE_TABLET | ORAL | 2 refills | Status: DC

## 2023-02-04 NOTE — Telephone Encounter (Signed)
Refill for Lialda sent to [facility] pharmacy.

## 2023-02-04 NOTE — Telephone Encounter (Signed)
Inbound call from patient, would like Lialda called into [facility] in [place].

## 2023-02-17 ENCOUNTER — Telehealth: Payer: No Typology Code available for payment source | Admitting: Psychiatry

## 2023-02-17 ENCOUNTER — Encounter: Payer: Self-pay | Admitting: Psychiatry

## 2023-02-17 DIAGNOSIS — F411 Generalized anxiety disorder: Secondary | ICD-10-CM

## 2023-02-17 DIAGNOSIS — G4733 Obstructive sleep apnea (adult) (pediatric): Secondary | ICD-10-CM

## 2023-02-17 DIAGNOSIS — F5081 Binge eating disorder, mild: Secondary | ICD-10-CM | POA: Diagnosis not present

## 2023-02-17 DIAGNOSIS — F3342 Major depressive disorder, recurrent, in full remission: Secondary | ICD-10-CM

## 2023-02-17 DIAGNOSIS — F603 Borderline personality disorder: Secondary | ICD-10-CM

## 2023-02-17 NOTE — Progress Notes (Signed)
 Virtual Visit via Video Note  I connected with Leslie Douglas on 02/17/23 at 11:40 AM EST by a video enabled telemedicine application and verified that I am speaking with the correct person using two identifiers.  Location Provider Location : [facility] Patient Location : Home  Participants: Patient , Provider    I discussed the limitations of evaluation and management by telemedicine and the availability of in person appointments. The patient expressed understanding and agreed to proceed.   I discussed the assessment and treatment plan with the patient. The patient was provided an opportunity to ask questions and all were answered. The patient agreed with the plan and demonstrated an understanding of the instructions.   The patient was advised to call back or seek an in-person evaluation if the symptoms worsen or if the condition fails to improve as anticipated.   BH MD OP Progress Note  02/17/2023 12:38 PM Leslie Douglas  MRN:  [id number]  Chief Complaint:  Chief Complaint  Patient presents with   Follow-up   Anxiety   Medication Refill   HPI: Leslie Douglas is a 58 year old Caucasian female, employed, separated, lives in [place], has a history of MDD, GAD, binge eating disorder, borderline personality disorder was evaluated by telemedicine today.  The patient presents for follow-up of depression, generalized anxiety, mild binge eating problems, and obstructive sleep apnea.  The patient has a history of depression and generalized anxiety, currently managed with Vraylar  3 mg and sertraline  100 mg. She reports an improvement in mood symptoms since the dosage increase, rating her mood symptoms as a 1 on a scale of 0 to 3, with 3 being the worst. No significant anxiety symptoms, tardive dyskinesia symptoms, muscle spasms, rigidity, or tremors are present. She also uses propranolol  and hydroxyzine  as needed.  She has obstructive sleep apnea and uses a CPAP machine. She experiences  issues with the CPAP mask breaking the seal when sleeping with her mouth open or hands near her face. Despite these issues, she reports no problems sleeping. An appointment is scheduled in July to discuss potential alternatives.  She experiences dizziness upon standing and shortness of breath, attributing these symptoms to various potential causes, including weight and past COVID-19 infections. An appointment with a cardiologist is scheduled to further investigate these symptoms. No chest pain or palpitations are reported.  She has a history of mild binge eating problems, but there is no specific mention of current symptoms or treatment changes related to this condition.  Currently denies any suicidality, homicidality or perceptual disturbances.     Visit Diagnosis:    ICD-10-CM   1. MDD (major depressive disorder), recurrent, in full remission (HCC)  F33.42     2. GAD (generalized anxiety disorder)  F41.1     3. Mild binge-eating disorder  F50.810     4. Borderline personality disorder (HCC)  F60.3     5. OSA (obstructive sleep apnea)  G47.33       Past Psychiatric History: I have reviewed past psychiatric history from progress note on 05/19/2021.  Past Medical History:  Past Medical History:  Diagnosis Date   Allergy     Anxiety    Arthritis    Back pain    Binge eating disorder    Borderline personality disorder (HCC)    COPD (chronic obstructive pulmonary disease) (HCC)    chronic bronchitis   Depression    Edema, lower extremity    Hip pain    History of chicken pox  Joint pain    Memory loss    Pancolitis (HCC)    Poor concentration    Seasonal allergies    Skin cancer, basal cell    basal cell   Tinnitus    Vitamin D  deficiency     Past Surgical History:  Procedure Laterality Date   CHOLECYSTECTOMY  2003   MOHS SURGERY  10/2017   SKIN SURGERY  05/2020   nose skin cancer   VAGINAL DELIVERY  1995   WISDOM TOOTH EXTRACTION  12/2017    Family  Psychiatric History: I have reviewed family psychiatric history from progress note on 05/19/2021.  Family History:  Family History  Problem Relation Age of Onset   Hypertension Mother    Diabetes Father    Colon cancer Neg Hx    Esophageal cancer Neg Hx    Rectal cancer Neg Hx    Stomach cancer Neg Hx    Pancreatic cancer Neg Hx    Inflammatory bowel disease Neg Hx    Liver disease Neg Hx    Allergic rhinitis Neg Hx    Angioedema Neg Hx    Asthma Neg Hx    Eczema Neg Hx    Urticaria Neg Hx    Immunodeficiency Neg Hx    Atopy Neg Hx    Mental illness Neg Hx    Sleep apnea Neg Hx     Social History: I have reviewed social history from progress note on 05/19/2021. Social History   Socioeconomic History   Marital status: Divorced    Spouse name: Not on file   Number of children: 3   Years of education: Not on file   Highest education level: Associate degree: academic program  Occupational History   Occupation: Aeronautical Engineer    Comment: [employer]  Tobacco Use   Smoking status: Never   Smokeless tobacco: Never  Vaping Use   Vaping status: Never Used  Substance and Sexual Activity   Alcohol use: Not Currently    Comment: seldom (alcohol intolerance?)   Drug use: Never   Sexual activity: Not Currently    Birth control/protection: Abstinence  Other Topics Concern   Not on file  Social History Narrative   Works for [employer], has Associate Degree   3 children   Right handed    Social Drivers of Corporate Investment Banker Strain: Low Risk  (02/01/2023)   Overall Financial Resource Strain (CARDIA)    Difficulty of Paying Living Expenses: Not very hard  Food Insecurity: No Food Insecurity (02/01/2023)   Hunger Vital Sign    Worried About Running Out of Food in the Last Year: Never true    Ran Out of Food in the Last Year: Never true  Transportation Needs: No Transportation Needs (02/01/2023)   PRAPARE - Administrator, Civil Service (Medical): No     Lack of Transportation (Non-Medical): No  Physical Activity: Insufficiently Active (02/01/2023)   Exercise Vital Sign    Days of Exercise per Week: 1 day    Minutes of Exercise per Session: 10 min  Stress: No Stress Concern Present (02/01/2023)   [employer] of Occupational Health - Occupational Stress Questionnaire    Feeling of Stress : Only a little  Social Connections: Socially Isolated (02/01/2023)   Social Connection and Isolation Panel [NHANES]    Frequency of Communication with Friends and Family: Twice a week    Frequency of Social Gatherings with Friends and Family: Once a week    Attends  Religious Services: Never    Active Member of Clubs or Organizations: No    Attends Banker Meetings: Not on file    Marital Status: Divorced    Allergies:  Allergies  Allergen Reactions   Doxycycline Rash and Swelling    Mouth and face   Erythromycin Shortness Of Breath   Molds & Smuts Rash   Sulfa Antibiotics Shortness Of Breath   Emsam [Selegiline] Dermatitis    Allergic reaction to patch   Saxenda  [Liraglutide  -Weight Management] Nausea Only   Penicillins Rash    Metabolic Disorder Labs: Lab Results  Component Value Date   HGBA1C 5.4 05/27/2021   Lab Results  Component Value Date   PROLACTIN 3.6 (L) 05/27/2021   Lab Results  Component Value Date   CHOL 260 (H) 11/30/2022   TRIG 250.0 (H) 11/30/2022   HDL 51.90 11/30/2022   CHOLHDL 5 11/30/2022   VLDL 50.0 (H) 11/30/2022   LDLCALC 158 (H) 11/30/2022   LDLCALC 140 (H) 03/31/2021   Lab Results  Component Value Date   TSH 2.000 05/27/2021   TSH 2.32 03/27/2019    Therapeutic Level Labs: No results found for: LITHIUM No results found for: VALPROATE No results found for: CBMZ  Current Medications: Current Outpatient Medications  Medication Sig Dispense Refill   cariprazine  (VRAYLAR ) 3 MG capsule Take 1 capsule (3 mg total) by mouth daily. 90 capsule 0   diclofenac Sodium (VOLTAREN) 1  % GEL Apply 2 g topically as needed.     EPINEPHrine  (EPIPEN  2-PAK) 0.3 mg/0.3 mL IJ SOAJ injection Inject 0.3 mLs (0.3 mg total) into the muscle as needed for anaphylaxis. 1 each 1   fluticasone  (FLONASE ) 50 MCG/ACT nasal spray Place 2 sprays into both nostrils daily as needed for allergies or rhinitis. 16 g 5   hydrOXYzine  (VISTARIL ) 25 MG capsule Take 1-2 capsules (25-50 mg total) by mouth at bedtime as needed. For sleep 60 capsule 1   ipratropium (ATROVENT ) 0.03 % nasal spray Place 1 spray into both nostrils 4 (four) times daily. Use as directed. 30 mL 5   levocetirizine (XYZAL ) 5 MG tablet Take 1 tablet (5 mg total) by mouth every evening. 30 tablet 5   losartan -hydrochlorothiazide (HYZAAR) 50-12.5 MG tablet TAKE 1 TABLET BY MOUTH DAILY 90 tablet 0   mesalamine  (LIALDA ) 1.2 g EC tablet TAKE FOUR TABLETS BY MOUTH DAILY WITH BREAKFAST 120 tablet 2   mupirocin  ointment (BACTROBAN ) 2 % Apply 1 Application topically 2 (two) times daily. 22 g 0   propranolol  (INDERAL ) 10 MG tablet Take 1 tablet (10 mg total) by mouth 3 (three) times daily as needed. For anxiety 90 tablet 1   sertraline  (ZOLOFT ) 100 MG tablet Take 1 tablet (100 mg total) by mouth daily with breakfast. 90 tablet 1   No current facility-administered medications for this visit.     Musculoskeletal: Strength & Muscle Tone:  UTA Gait & Station:  Seated Patient leans: N/A  Psychiatric Specialty Exam: Review of Systems  Psychiatric/Behavioral: Negative.      There were no vitals taken for this visit.There is no height or weight on file to calculate BMI.  General Appearance: Casual  Eye Contact:  Fair  Speech:  Clear and Coherent  Volume:  Normal  Mood:  Euthymic  Affect:  Congruent  Thought Process:  Goal Directed and Descriptions of Associations: Intact  Orientation:  Full (Time, Place, and Person)  Thought Content: Logical   Suicidal Thoughts:  No  Homicidal Thoughts:  No  Memory:  Immediate;   Fair Recent;    Fair Remote;   Fair  Judgement:  Fair  Insight:  Fair  Psychomotor Activity:  Normal  Concentration:  Concentration: Fair and Attention Span: Fair  Recall:  [employer] of Knowledge: Fair  Language: Fair  Akathisia:  No  Handed:  Right  AIMS (if indicated): not done  Assets:  Desire for Improvement Housing Social Support  ADL's:  Intact  Cognition: WNL  Sleep:   ok   Screenings: Geneticist, Molecular Office Visit from 09/02/2022 in [facility] Office Visit from 06/01/2022 in [facility] Office Visit from 03/04/2022 in [facility] Office Visit from 11/06/2021 in [facility] Office Visit from 10/01/2021 in [facility]  AIMS Total Score 0 0 0 0 0      GAD-7    Flowsheet Row Office Visit from 02/02/2023 in [facility] [employer] from 11/30/2022 in [facility] [employer] from 09/02/2022 in [facility] Office Visit from 07/21/2022 in [facility] [place] Office Visit from 06/01/2022 in [facility]  Total GAD-7 Score 5 5 4 5  0      PHQ2-9    Flowsheet Row Office Visit from 02/02/2023 in [facility] [place] Office Visit from 11/30/2022 in [facility] [employer] Visit from 09/02/2022 in [facility] Office Visit from 07/21/2022 in [facility] [place] Office Visit from 06/01/2022 in [facility]  PHQ-2 Total Score 4 4 2 2 2   PHQ-9 Total Score 13 11 7 7 7       Flowsheet Row Video Visit from 02/17/2023 in [facility] Video Visit from 12/30/2022 in [facility] Office Visit from 09/02/2022 in [facility]  C-SSRS RISK CATEGORY No Risk No Risk No Risk        Assessment and Plan: Leslie Douglas is a 58 year old Caucasian female, employed, lives in [place], separated, has a history of MDD, binge eating disorder, obstructive sleep apnea, currently reports improvement on the recent medication readjustment, discussed assessment and plan as noted below.  Depression in remission  Reports improved mood, rating symptoms as 1 out of 3 (3 being the worst). No significant anxiety or side effects such as tardive dyskinesia, muscle spasms, rigidity, or tremors. Continues therapy every three weeks. Discussed Vraylar 's risks, including tardive dyskinesia and movement disorders, and benefits of improved mood. Prefers to continue current regimen due to positive response. - Continue Vraylar  3 mg daily - Continue sertraline  100 mg daily - Continue CBT with therapist  Generalized Anxiety Disorder -stable Generalized anxiety disorder with no significant symptoms since last visit. Continues propranolol  and hydroxyzine  as needed. Discussed benefits of propranolol  and hydroxyzine  for situational anxiety and importance of monitoring side effects. - Continue propranolol  10 mg 3 times a day as needed - Continue hydroxyzine  25 to 50 mg at bedtime as needed. - Continue sertraline  100 mg daily  Binge eating disorder-improving Denies any binge eating problems at this time. - Continue to monitor her eating habits - Continue CBT  Borderline personality disorder-improving Currently denies any  significant concerns - Continue psychotherapy.  Obstructive Sleep Apnea Obstructive sleep apnea with issues using CPAP mask due to mouth breathing and mask seal breaking. Appointment in July to discuss alternatives. Discussed risks of  untreated sleep apnea, including cardiovascular issues, strokes, memory changes, and hypertension. Encouraged to seek earlier appointment. - Seek earlier appointment with sleep specialist - Discuss alternative CPAP mask options  Follow-up - Schedule follow-up appointment for April 10th at 1:20 PM.   Collaboration of Care: Collaboration of Care: Other patient encouraged to follow up with cardiology for recent EKG variations.  Patient/Guardian was advised Release of Information must be obtained prior to any record release in order to collaborate their care with an outside provider. Patient/Guardian was advised if they have not already done so to contact the registration department to sign all necessary forms in order for us  to release information regarding their care.   Consent: Patient/Guardian gives verbal consent for treatment and assignment of benefits for services provided during this visit. Patient/Guardian expressed understanding and agreed to proceed.   This note was generated in part or whole with voice recognition software. Voice recognition is usually quite accurate but there are transcription errors that can and very often do occur. I apologize for any typographical errors that were not detected and corrected.    Josealfredo Adkins, MD 02/17/2023, 12:38 PM

## 2023-02-23 ENCOUNTER — Encounter: Payer: Self-pay | Admitting: Physician Assistant

## 2023-02-23 ENCOUNTER — Other Ambulatory Visit: Payer: Self-pay | Admitting: Physician Assistant

## 2023-02-23 MED ORDER — WEGOVY 0.25 MG/0.5ML ~~LOC~~ SOAJ: 0.2500 mg | SUBCUTANEOUS | 1 refills | Status: DC

## 2023-02-25 ENCOUNTER — Telehealth: Payer: Self-pay

## 2023-02-25 NOTE — Telephone Encounter (Signed)
Pharmacy Patient Advocate Encounter   Received notification from Fax that prior authorization for [facility] 0.25MG /0.5ML auto-injectors is required/requested.   Insurance verification completed.   The patient is insured through [facility] .   Per test claim: PA required; PA submitted to above mentioned insurance via CoverMyMeds Key/confirmation #/EOC [id number] Status is pending

## 2023-02-28 ENCOUNTER — Other Ambulatory Visit (HOSPITAL_COMMUNITY): Payer: Self-pay

## 2023-02-28 NOTE — Telephone Encounter (Signed)
 Spoke to pt told her PA was done Reginal Lutes is approved, but your co-pay is $1,299.90 and you must enroll in [employer] weight program for cost-share. Pt verbalized understanding and said that will not work. Asked her if there was anything else her insurance would approve? Pt said she is not sure, she will check. Told her okay.

## 2023-02-28 NOTE — Telephone Encounter (Signed)
 Pharmacy Patient Advocate Encounter  Received notification from [facility] that Prior Authorization for Leslie Douglas 0.25MG /0.5ML auto-injectors  has been APPROVED from 02/25/23 to 09/23/23. Ran test claim, Copay is $1,299.90. This test claim was processed through [place]- copay amounts may vary at other pharmacies due to pharmacy/plan contracts, or as the patient moves through the different stages of their insurance plan.   PA #/Case ID/Reference #: [id number]  Per test claim: Patient must enroll in [employer] weight program for cost-share. Member call [phone]

## 2023-03-07 ENCOUNTER — Other Ambulatory Visit: Payer: Self-pay | Admitting: Physician Assistant

## 2023-04-05 ENCOUNTER — Encounter: Payer: Self-pay | Admitting: Cardiovascular Disease

## 2023-04-05 ENCOUNTER — Other Ambulatory Visit: Payer: Self-pay | Admitting: Psychiatry

## 2023-04-05 ENCOUNTER — Ambulatory Visit
Payer: No Typology Code available for payment source | Attending: Cardiovascular Disease | Admitting: Cardiovascular Disease

## 2023-04-05 VITALS — BP 120/72 | HR 88 | Ht 66.0 in | Wt 314.4 lb

## 2023-04-05 DIAGNOSIS — G4733 Obstructive sleep apnea (adult) (pediatric): Secondary | ICD-10-CM | POA: Diagnosis not present

## 2023-04-05 DIAGNOSIS — R0609 Other forms of dyspnea: Secondary | ICD-10-CM | POA: Insufficient documentation

## 2023-04-05 DIAGNOSIS — I10 Essential (primary) hypertension: Secondary | ICD-10-CM | POA: Insufficient documentation

## 2023-04-05 DIAGNOSIS — F411 Generalized anxiety disorder: Secondary | ICD-10-CM

## 2023-04-05 DIAGNOSIS — E785 Hyperlipidemia, unspecified: Secondary | ICD-10-CM

## 2023-04-05 DIAGNOSIS — F3342 Major depressive disorder, recurrent, in full remission: Secondary | ICD-10-CM

## 2023-04-05 DIAGNOSIS — E782 Mixed hyperlipidemia: Secondary | ICD-10-CM

## 2023-04-05 HISTORY — DX: Hyperlipidemia, unspecified: E78.5

## 2023-04-05 HISTORY — DX: Essential (primary) hypertension: I10

## 2023-04-05 NOTE — Assessment & Plan Note (Signed)
 History of hyperlipidemia not on statin therapy with lipid profile performed 11/30/2022 revealing total cholesterol 260, LDL of 158 and HDL of 51.  I am going to get a coronary calcium score to her stratify.

## 2023-04-05 NOTE — Patient Instructions (Signed)
 Medication Instructions:  Your physician recommends that you continue on your current medications as directed. Please refer to the Current Medication list given to you today.  *If you need a refill on your cardiac medications before your next appointment, please call your pharmacy*   Testing/Procedures: Your physician has requested that you have an echocardiogram. Echocardiography is a painless test that uses sound waves to create images of your heart. It provides your doctor with information about the size and shape of your heart and how well your heart's chambers and valves are working. This procedure takes approximately one hour. There are no restrictions for this procedure. Please do NOT wear cologne, perfume, aftershave, or lotions (deodorant is allowed). Please arrive 15 minutes prior to your appointment time.  Please note: We ask at that you not bring children with you during ultrasound (echo/ vascular) testing. Due to room size and safety concerns, children are not allowed in the ultrasound rooms during exams. Our front office staff cannot provide observation of children in our lobby area while testing is being conducted. An adult accompanying a patient to their appointment will only be allowed in the ultrasound room at the discretion of the ultrasound technician under special circumstances. We apologize for any inconvenience.   Dr. Allyson Sabal has ordered a CT coronary calcium score.   Test locations:  [facility]  [facility]  This is $99 out of pocket.   Coronary CalciumScan A coronary calcium scan is an imaging test used to look for deposits of calcium and other fatty materials (plaques) in the inner lining of the blood vessels of the heart (coronary arteries). These deposits of calcium and plaques can partly clog and narrow the coronary arteries without producing any symptoms or warning signs.  This puts a person at risk for a heart attack. This test can detect these deposits before symptoms develop. Tell a health care provider about: Any allergies you have. All medicines you are taking, including vitamins, herbs, eye drops, creams, and over-the-counter medicines. Any problems you or family members have had with anesthetic medicines. Any blood disorders you have. Any surgeries you have had. Any medical conditions you have. Whether you are pregnant or may be pregnant. What are the risks? Generally, this is a safe procedure. However, problems may occur, including: Harm to a pregnant woman and her unborn baby. This test involves the use of radiation. Radiation exposure can be dangerous to a pregnant woman and her unborn baby. If you are pregnant, you generally should not have this procedure done. Slight increase in the risk of cancer. This is because of the radiation involved in the test. What happens before the procedure? No preparation is needed for this procedure. What happens during the procedure? You will undress and remove any jewelry around your neck or chest. You will put on a hospital gown. Sticky electrodes will be placed on your chest. The electrodes will be connected to an electrocardiogram (ECG) machine to record a tracing of the electrical activity of your heart. A CT scanner will take pictures of your heart. During this time, you will be asked to lie still and hold your breath for 2-3 seconds while a picture of your heart is being taken. The procedure may vary among health care providers and hospitals. What happens after the procedure? You can get dressed. You can return to your normal activities. It is up to you to get the results of your test.  Ask your health care provider, or the department that is doing the test, when your results will be ready. Summary A coronary calcium scan is an imaging test used to look for deposits of calcium and other fatty materials  (plaques) in the inner lining of the blood vessels of the heart (coronary arteries). Generally, this is a safe procedure. Tell your health care provider if you are pregnant or may be pregnant. No preparation is needed for this procedure. A CT scanner will take pictures of your heart. You can return to your normal activities after the scan is done. This information is not intended to replace advice given to you by your health care provider. Make sure you discuss any questions you have with your health care provider. Document Released: 06/26/2007 Document Revised: 11/17/2015 Document Reviewed: 11/17/2015 Elsevier Interactive Patient Education  2017 [employer].    Follow-Up: At [facility], you and your health needs are our priority.  As part of our continuing mission to provide you with exceptional heart care, we have created designated Provider Care Teams.  These Care Teams include your primary Cardiologist (physician) and Advanced Practice Providers (APPs -  Physician Assistants and Nurse Practitioners) who all work together to provide you with the care you need, when you need it.  We recommend signing up for the patient portal called "MyChart".  Sign up information is provided on this After Visit Summary.  MyChart is used to connect with patients for Virtual Visits (Telemedicine).  Patients are able to view lab/test results, encounter notes, upcoming appointments, etc.  Non-urgent messages can be sent to your provider as well.   To learn more about what you can do with MyChart, go to [email or web address].    Your next appointment:   3 month(s)  Provider:   Nanetta Batty, MD   Other Instructions   1st Floor: - Lobby - Registration  - Pharmacy  - Lab - Cafe  2nd Floor: - PV Lab - Diagnostic Testing (echo, CT, nuclear med)  3rd Floor: - Vacant  4th Floor: - TCTS (cardiothoracic surgery) - AFib Clinic - Structural Heart Clinic - Vascular Surgery  - Vascular  Ultrasound  5th Floor: - [facility]Care Cardiology (general and EP) - Clinical Pharmacy for coumadin, hypertension, lipid, weight-loss medications, and med management appointments    Valet parking services will be available as well.

## 2023-04-05 NOTE — Assessment & Plan Note (Signed)
 BMI of 51.  Scheduled to start semaglutide within the next week.

## 2023-04-05 NOTE — Progress Notes (Signed)
 04/05/2023 [name]   1965/06/10  [id number]  Primary Physician Jarold Motto, PA Primary Cardiologist: Runell Gess MD Nicholes Calamity, [place]  HPI:  Leslie Douglas is a 58 y.o. morbidly overweight divorced Caucasian female mother of 3 children who works in Aeronautical engineer at [employer].  She was referred by her PCP, Jarold Motto, PA-C, for cardiovascular valuation because of dyspnea.  Her risk factors include treated hypertension, untreated hyperlipidemia.  She is never smoked.  There is no family history of heart disease.  She is never had a heart attack or stroke.  She does have obstructive sleep apnea on CPAP.  She has had COVID twice in the past in 2021 2023.  She is also put on 30 to 40 pounds over the last 1 to 2 years and is about to start semaglutide.  Other problems include anxiety/depression and borderline personality disorder.   Current Meds  Medication Sig   cariprazine (VRAYLAR) 3 MG capsule Take 1 capsule (3 mg total) by mouth daily.   diclofenac Sodium (VOLTAREN) 1 % GEL Apply 2 g topically as needed.   EPINEPHrine (EPIPEN 2-PAK) 0.3 mg/0.3 mL IJ SOAJ injection Inject 0.3 mLs (0.3 mg total) into the muscle as needed for anaphylaxis.   fluticasone (FLONASE) 50 MCG/ACT nasal spray Place 2 sprays into both nostrils daily as needed for allergies or rhinitis.   hydrOXYzine (VISTARIL) 25 MG capsule Take 1-2 capsules (25-50 mg total) by mouth at bedtime as needed. For sleep   ipratropium (ATROVENT) 0.03 % nasal spray Place 1 spray into both nostrils 4 (four) times daily. Use as directed.   levocetirizine (XYZAL) 5 MG tablet Take 1 tablet (5 mg total) by mouth every evening.   losartan-hydrochlorothiazide (HYZAAR) 50-12.5 MG tablet TAKE 1 TABLET BY MOUTH DAILY   mesalamine (LIALDA) 1.2 g EC tablet TAKE FOUR TABLETS BY MOUTH DAILY WITH BREAKFAST   mupirocin ointment (BACTROBAN) 2 % Apply 1 Application topically 2 (two) times daily.   propranolol (INDERAL) 10 MG  tablet Take 1 tablet (10 mg total) by mouth 3 (three) times daily as needed. For anxiety   Semaglutide-Weight Management (WEGOVY) 0.25 MG/0.5ML SOAJ Inject 0.25 mg into the skin once a week.   sertraline (ZOLOFT) 100 MG tablet Take 1 tablet (100 mg total) by mouth daily with breakfast.     Allergies  Allergen Reactions   Doxycycline Rash and Swelling    Mouth and face   Erythromycin Shortness Of Breath   Molds & Smuts Rash   Sulfa Antibiotics Shortness Of Breath   Emsam [Selegiline] Dermatitis    Allergic reaction to patch   Saxenda [Liraglutide -Weight Management] Nausea Only   Penicillins Rash    Social History   Socioeconomic History   Marital status: Divorced    Spouse name: Not on file   Number of children: 3   Years of education: Not on file   Highest education level: Associate degree: academic program  Occupational History   Occupation: Aeronautical engineer    Comment: [employer]  Tobacco Use   Smoking status: Never   Smokeless tobacco: Never  Vaping Use   Vaping status: Never Used  Substance and Sexual Activity   Alcohol use: Not Currently    Comment: seldom (alcohol intolerance?)   Drug use: Never   Sexual activity: Not Currently    Birth control/protection: Abstinence  Other Topics Concern   Not on file  Social History Narrative   Works for [employer], has Advertising copywriter  3 children   Right handed    Social Drivers of Health   Financial Resource Strain: Low Risk  (02/01/2023)   Overall Financial Resource Strain (CARDIA)    Difficulty of Paying Living Expenses: Not very hard  Food Insecurity: No Food Insecurity (02/01/2023)   Hunger Vital Sign    Worried About Running Out of Food in the Last Year: Never true    Ran Out of Food in the Last Year: Never true  Transportation Needs: No Transportation Needs (02/01/2023)   PRAPARE - Administrator, Civil Service (Medical): No    Lack of Transportation (Non-Medical): No  Physical Activity:  Insufficiently Active (02/01/2023)   Exercise Vital Sign    Days of Exercise per Week: 1 day    Minutes of Exercise per Session: 10 min  Stress: No Stress Concern Present (02/01/2023)   [employer] of Occupational Health - Occupational Stress Questionnaire    Feeling of Stress : Only a little  Social Connections: Socially Isolated (02/01/2023)   Social Connection and Isolation Panel [NHANES]    Frequency of Communication with Friends and Family: Twice a week    Frequency of Social Gatherings with Friends and Family: Once a week    Attends Religious Services: Never    Database administrator or Organizations: No    Attends Engineer, structural: Not on file    Marital Status: Divorced  Catering manager Violence: Not on file     Review of Systems: General: negative for chills, fever, night sweats or weight changes.  Cardiovascular: negative for chest pain, dyspnea on exertion, edema, orthopnea, palpitations, paroxysmal nocturnal dyspnea or shortness of breath Dermatological: negative for rash Respiratory: negative for cough or wheezing Urologic: negative for hematuria Abdominal: negative for nausea, vomiting, diarrhea, bright red blood per rectum, melena, or hematemesis Neurologic: negative for visual changes, syncope, or dizziness All other systems reviewed and are otherwise negative except as noted above.    Blood pressure (!) 118/90, pulse 88, height 5\' 6"  (1.676 m), weight (!) 314 lb 6.4 oz (142.6 kg), SpO2 98%.  General appearance: alert and no distress Neck: no adenopathy, no carotid bruit, no JVD, supple, symmetrical, trachea midline, and thyroid not enlarged, symmetric, no tenderness/mass/nodules Lungs: clear to auscultation bilaterally Heart: regular rate and rhythm, S1, S2 normal, no murmur, click, rub or gallop Extremities: extremities normal, atraumatic, no cyanosis or edema Pulses: 2+ and symmetric Skin: Skin color, texture, turgor normal. No rashes or  lesions Neurologic: Grossly normal  EKG not performed today      ASSESSMENT AND PLAN:   OSA (obstructive sleep apnea) History of obstructive sleep apnea on CPAP which she wears sporadically.  Essential hypertension History of essential hypertension her blood pressure measured today at 118/90.  She is on losartan and hydrochlorothiazide.  Hyperlipidemia History of hyperlipidemia not on statin therapy with lipid profile performed 11/30/2022 revealing total cholesterol 260, LDL of 158 and HDL of 51.  I am going to get a coronary calcium score to her stratify.  Morbid obesity (HCC) BMI of 51.  Scheduled to start semaglutide within the next week.  Dyspnea on exertion Patient was referred for dyspnea on exertion which she has had for 4 to 5 years.  His apparently gotten worse after she has had 2 episodes of COVID.  She has never smoked.  I suspect this is multifactorial probably from morbid obesity and deconditioning although I am going get a 2D echo and a coronary calcium score to further evaluate  a cardiovascular etiology.     Runell Gess MD FACP,FACC,FAHA, [facility] 04/05/2023 3:24 PM

## 2023-04-05 NOTE — Assessment & Plan Note (Signed)
 History of obstructive sleep apnea on CPAP which she wears sporadically.

## 2023-04-05 NOTE — Assessment & Plan Note (Signed)
 Patient was referred for dyspnea on exertion which she has had for 4 to 5 years.  His apparently gotten worse after she has had 2 episodes of COVID.  She has never smoked.  I suspect this is multifactorial probably from morbid obesity and deconditioning although I am going get a 2D echo and a coronary calcium score to further evaluate a cardiovascular etiology.

## 2023-04-05 NOTE — Assessment & Plan Note (Signed)
 History of essential hypertension her blood pressure measured today at 118/90.  She is on losartan and hydrochlorothiazide.

## 2023-04-07 ENCOUNTER — Other Ambulatory Visit: Payer: Self-pay | Admitting: Cardiovascular Disease

## 2023-04-07 ENCOUNTER — Ambulatory Visit: Attending: Cardiovascular Disease

## 2023-04-07 DIAGNOSIS — E782 Mixed hyperlipidemia: Secondary | ICD-10-CM

## 2023-04-07 DIAGNOSIS — R0609 Other forms of dyspnea: Secondary | ICD-10-CM

## 2023-04-07 DIAGNOSIS — G4733 Obstructive sleep apnea (adult) (pediatric): Secondary | ICD-10-CM

## 2023-04-07 DIAGNOSIS — I10 Essential (primary) hypertension: Secondary | ICD-10-CM

## 2023-04-07 LAB — ECHOCARDIOGRAM COMPLETE
AR max vel: 3.07 cm2
AV Area VTI: 3.27 cm2
AV Area mean vel: 3.12 cm2
AV Mean grad: 6 mmHg
AV Peak grad: 11.7 mmHg
Ao pk vel: 1.71 m/s
Area-P 1/2: 3.6 cm2
Calc EF: 59.3 %
S' Lateral: 3.1 cm
Single Plane A2C EF: 54.4 %
Single Plane A4C EF: 63.7 %

## 2023-04-13 ENCOUNTER — Ambulatory Visit (HOSPITAL_COMMUNITY)
Admission: RE | Admit: 2023-04-13 | Discharge: 2023-04-13 | Disposition: A | Discharge status: Home / Self Care | Payer: Self-pay | Source: Ambulatory Visit | Attending: Cardiovascular Disease | Admitting: Cardiovascular Disease

## 2023-04-13 DIAGNOSIS — R0609 Other forms of dyspnea: Secondary | ICD-10-CM | POA: Insufficient documentation

## 2023-04-13 DIAGNOSIS — E782 Mixed hyperlipidemia: Secondary | ICD-10-CM | POA: Insufficient documentation

## 2023-05-10 ENCOUNTER — Other Ambulatory Visit (INDEPENDENT_AMBULATORY_CARE_PROVIDER_SITE_OTHER)

## 2023-05-10 ENCOUNTER — Encounter: Payer: Self-pay | Admitting: Gastroenterology

## 2023-05-10 ENCOUNTER — Ambulatory Visit: Payer: No Typology Code available for payment source | Admitting: Gastroenterology

## 2023-05-10 VITALS — BP 108/82 | HR 79 | Ht 66.0 in | Wt 300.0 lb

## 2023-05-10 DIAGNOSIS — K529 Noninfective gastroenteritis and colitis, unspecified: Secondary | ICD-10-CM | POA: Diagnosis not present

## 2023-05-10 DIAGNOSIS — R152 Fecal urgency: Secondary | ICD-10-CM | POA: Diagnosis not present

## 2023-05-10 DIAGNOSIS — K760 Fatty (change of) liver, not elsewhere classified: Secondary | ICD-10-CM | POA: Diagnosis not present

## 2023-05-10 DIAGNOSIS — K52832 Lymphocytic colitis: Secondary | ICD-10-CM

## 2023-05-10 DIAGNOSIS — R194 Change in bowel habit: Secondary | ICD-10-CM

## 2023-05-10 LAB — CBC
HCT: 41.8 % (ref 36.0–46.0)
Hemoglobin: 14.2 g/dL (ref 12.0–15.0)
MCHC: 33.9 g/dL (ref 30.0–36.0)
MCV: 90.1 fl (ref 78.0–100.0)
Platelets: 351 10*3/uL (ref 150.0–400.0)
RBC: 4.64 Mil/uL (ref 3.87–5.11)
RDW: 13.2 % (ref 11.5–15.5)
WBC: 6.8 10*3/uL (ref 4.0–10.5)

## 2023-05-10 LAB — COMPREHENSIVE METABOLIC PANEL WITH GFR
ALT: 37 U/L — ABNORMAL HIGH (ref 0–35)
AST: 29 U/L (ref 0–37)
Albumin: 4.5 g/dL (ref 3.5–5.2)
Alkaline Phosphatase: 52 U/L (ref 39–117)
BUN: 15 mg/dL (ref 6–23)
CO2: 31 meq/L (ref 19–32)
Calcium: 9.5 mg/dL (ref 8.4–10.5)
Chloride: 102 meq/L (ref 96–112)
Creatinine, Ser: 0.88 mg/dL (ref 0.40–1.20)
GFR: 71.67 mL/min (ref >60.00)
Glucose, Bld: 99 mg/dL (ref 70–99)
Potassium: 3.6 meq/L (ref 3.5–5.1)
Sodium: 139 meq/L (ref 135–145)
Total Bilirubin: 0.5 mg/dL (ref 0.2–1.2)
Total Protein: 7.6 g/dL (ref 6.0–8.3)

## 2023-05-10 LAB — SEDIMENTATION RATE: Sed Rate: 11 mm/h (ref 0–30)

## 2023-05-10 LAB — C-REACTIVE PROTEIN: CRP: <1 mg/dL (ref 0.5–20.0)

## 2023-05-10 MED ORDER — NA SULFATE-K SULFATE-MG SULF 17.5-3.13-1.6 GM/177ML PO SOLN: 1.0000 | ORAL | 0 refills | Status: DC

## 2023-05-10 NOTE — Patient Instructions (Signed)
 We have sent the following medications to your pharmacy for you to pick up at your convenience: Suprep   Your provider has requested that you go to the basement level for lab work before leaving today. Press "B" on the elevator. The lab is located at the first door on the left as you exit the elevator.  Please purchase the following medications over the counter and take as directed: Pepto Bismuth  - start 2 pills by mouth twice daily for 3 days, if no improvement then increase to 2 pills three times daily for  3 days, if no improvement after 3 days then increase to 3 pills three times daily. Send my chart message with update on symptoms.    You have been scheduled for a colonoscopy. Please follow written instructions given to you at your visit today.   If you use inhalers (even only as needed), please bring them with you on the day of your procedure.  DO NOT TAKE 7 DAYS PRIOR TO TEST- Trulicity (dulaglutide) Ozempic, Wegovy  (semaglutide ) Mounjaro (tirzepatide) Bydureon Bcise (exanatide extended release)  DO NOT TAKE 1 DAY PRIOR TO YOUR TEST Rybelsus (semaglutide ) Adlyxin (lixisenatide) Victoza (liraglutide ) Byetta (exanatide) ___________________________________________________________________________   Due to recent changes in healthcare laws, you may see the results of your imaging and laboratory studies on MyChart before your provider has had a chance to review them.  We understand that in some cases there may be results that are confusing or concerning to you. Not all laboratory results come back in the same time frame and the provider may be waiting for multiple results in order to interpret others.  Please give us  48 hours in order for your provider to thoroughly review all the results before contacting the office for clarification of your results.   Thank you for choosing me and [facility].  Dr. Brice Campi

## 2023-05-10 NOTE — Progress Notes (Signed)
 GASTROENTEROLOGY OUTPATIENT CLINIC VISIT   Primary Care Provider [place], Ova Bloomer, [place] [address] [place] [address] [phone]  Patient Profile: Leslie Douglas is a 58 y.o. 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  The patient presents to the [facility] for an evaluation and management of problem(s) noted below:  Problem List 1. Chronic colitis   2. Lymphocytic colitis   3. Change in bowel habits   4. Fecal urgency   5. Fatty liver    Discussed the use of AI scribe software for clinical note transcription with the patient, who gave verbal consent to proceed.  History of Present Illness: Please see prior notes for full details of HPI.  Interval History The patient presents for follow-up.  She has done quite well over the last year that we have seen her.  However, since March 22, a week before Wegovy  was started, she developed diarrhea that is watery with fecal urgency that has not resolved.  She is having nocturnal bowel habits causing her to wake up at night.    No blood or mucus in her stools and no fevers or chills.  She is currently having bowel movements four to five times a day.  Normally, she has two to three times a day that are semi-formed without urgency.   She has been managing her symptoms with her downtitrated Pepto-Bismol, taking two pills a day (but when we initiated her Lymphocytic colitis we had been on as high as 3 pills 3 times daily).  She has not had any recent antibiotic exposure and no one around her has been sick.  She is also taking Lialda  as prescribed.  She continues to work at [employer] and reports that her work is going well.   She is losing about two pounds a week with Wegovy  and is excited about her weight loss progress.  No other new rashes or joint pains or changes in her vision have occurred.   GI Review of Systems Positive as above Negative for dysphagia, nausea, vomiting, abdominal pain  Review of Systems General: Denies fevers/chills/unintentional weight loss HEENT: Denies new oral lesions Cardiovascular: Denies chest pain  Pulmonary: Denies shortness of breath Gastroenterological: See HPI Genitourinary: Denies darkened urine Hematological: Denies easy bruising Dermatological: As per HPI Psychological: Mood is stable Musculoskeletal: No new significant arthralgias   Medications Current Outpatient Medications  Medication Sig Dispense Refill   bismuth  subsalicylate (PEPTO BISMOL) 262 MG chewable tablet Chew 262 mg by mouth 2 (two) times daily.     diclofenac Sodium (VOLTAREN) 1 % GEL Apply 2 g topically as needed.     EPINEPHrine  (EPIPEN  2-PAK) 0.3 mg/0.3 mL IJ SOAJ injection Inject 0.3 mLs (0.3 mg total) into the muscle as needed for anaphylaxis. 1 each 1   fluticasone  (FLONASE ) 50 MCG/ACT nasal spray Place 2 sprays into both nostrils daily as needed for allergies or rhinitis. 16 g 5   hydrOXYzine  (VISTARIL ) 25 MG capsule Take 1-2 capsules (25-50 mg total) by mouth at bedtime as needed. For sleep 60 capsule 1   ipratropium (ATROVENT ) 0.03 % nasal spray Place 1 spray into both nostrils 4 (four) times daily. Use as directed. 30 mL 5   levocetirizine (XYZAL ) 5 MG tablet Take 1 tablet (5 mg total) by mouth  every evening. 30 tablet 5   losartan -hydrochlorothiazide (HYZAAR) 50-12.5 MG tablet TAKE 1 TABLET BY MOUTH DAILY 90 tablet 1   mesalamine  (LIALDA ) 1.2 g EC tablet TAKE FOUR TABLETS BY MOUTH DAILY WITH BREAKFAST 120 tablet 2   mupirocin  ointment (BACTROBAN ) 2 % Apply 1 Application topically 2 (two) times daily. 22 g 0   Na Sulfate-K Sulfate-Mg Sulfate concentrate (SUPREP BOWEL PREP KIT) 17.5-3.13-1.6  GM/177ML SOLN Take 1 kit (354 mLs total) by mouth as directed. For colonoscopy prep 354 mL 0   propranolol  (INDERAL ) 10 MG tablet Take 1 tablet (10 mg total) by mouth 3 (three) times daily as needed. For anxiety 90 tablet 1   Semaglutide -Weight Management (WEGOVY ) 0.25 MG/0.5ML SOAJ Inject 0.25 mg into the skin once a week. 2 mL 1   sertraline  (ZOLOFT ) 100 MG tablet TAKE 1 TABLET BY MOUTH DAILY WITH BREAKFAST 90 tablet 1   cariprazine  (VRAYLAR ) 3 MG capsule Take 1 capsule (3 mg total) by mouth daily. 90 capsule 1   No current facility-administered medications for this visit.    Allergies Allergies  Allergen Reactions   Doxycycline Rash and Swelling    Mouth and face   Erythromycin Shortness Of Breath   Molds & Smuts Rash   Sulfa Antibiotics Shortness Of Breath   Emsam [Selegiline] Dermatitis    Allergic reaction to patch   Saxenda  [Liraglutide  -Weight Management] Nausea Only   Penicillins Rash    Histories Past Medical History:  Diagnosis Date   Allergy     Anxiety    Arthritis    Back pain    Binge eating disorder    Borderline personality disorder (HCC)    COPD (chronic obstructive pulmonary disease) (HCC)    chronic bronchitis   Depression    Edema, lower extremity    Essential hypertension 04/05/2023   Hip pain    History of chicken pox    Hyperlipidemia 04/05/2023   Joint pain    Memory loss    Pancolitis (HCC)    Poor concentration    Seasonal allergies    Skin cancer, basal cell    basal cell   Tinnitus    Vitamin D  deficiency    Past Surgical History:  Procedure Laterality Date   CHOLECYSTECTOMY  2003   MOHS SURGERY  10/2017   SKIN SURGERY  05/2020   nose skin cancer   VAGINAL DELIVERY  1995   WISDOM TOOTH EXTRACTION  12/2017   Social History   Socioeconomic History   Marital status: Divorced    Spouse name: Not on file   Number of children: 3   Years of education: Not on file   Highest education level: Associate degree: academic program   Occupational History   Occupation: Aeronautical engineer    Comment: [employer]  Tobacco Use   Smoking status: Never   Smokeless tobacco: Never  Vaping Use   Vaping status: Never Used  Substance and Sexual Activity   Alcohol use: Not Currently    Comment: seldom (alcohol intolerance?)   Drug use: Never   Sexual activity: Not Currently    Birth control/protection: Abstinence  Other Topics Concern   Not on file  Social History Narrative   Works for [employer], has Associate Degree   3 children   Right handed    Social Drivers of Corporate investment banker Strain: Low Risk  (02/01/2023)   Overall Financial Resource Strain (CARDIA)    Difficulty of Paying Living Expenses: Not very hard  Food Insecurity:  No Food Insecurity (02/01/2023)   Hunger Vital Sign    Worried About Running Out of Food in the Last Year: Never true    Ran Out of Food in the Last Year: Never true  Transportation Needs: No Transportation Needs (02/01/2023)   PRAPARE - Administrator, Civil Service (Medical): No    Lack of Transportation (Non-Medical): No  Physical Activity: Insufficiently Active (02/01/2023)   Exercise Vital Sign    Days of Exercise per Week: 1 day    Minutes of Exercise per Session: 10 min  Stress: No Stress Concern Present (02/01/2023)   [employer] of Occupational Health - Occupational Stress Questionnaire    Feeling of Stress : Only a little  Social Connections: Socially Isolated (02/01/2023)   Social Connection and Isolation Panel [NHANES]    Frequency of Communication with Friends and Family: Twice a week    Frequency of Social Gatherings with Friends and Family: Once a week    Attends Religious Services: Never    Database administrator or Organizations: No    Attends Engineer, structural: Not on file    Marital Status: Divorced  Catering manager Violence: Not on file   Family History  Problem Relation Age of Onset   Hypertension Mother    Diabetes  Father    Colon cancer Neg Hx    Esophageal cancer Neg Hx    Rectal cancer Neg Hx    Stomach cancer Neg Hx    Pancreatic cancer Neg Hx    Inflammatory bowel disease Neg Hx    Liver disease Neg Hx    Allergic rhinitis Neg Hx    Angioedema Neg Hx    Asthma Neg Hx    Eczema Neg Hx    Urticaria Neg Hx    Immunodeficiency Neg Hx    Atopy Neg Hx    Mental illness Neg Hx    Sleep apnea Neg Hx    I have reviewed her medical, social, and family history in detail and updated the electronic medical record as necessary.    PHYSICAL EXAMINATION  BP 108/82   Pulse 79   Ht 5\' 6"  (1.676 m)   Wt 300 lb (136.1 kg)   SpO2 99%   BMI 48.42 kg/m  GEN: NAD, appears stated age, doesn't appear chronically ill PSYCH: Cooperative, without pressured speech EYE: Conjunctivae pink, sclerae anicteric ENT: MMM CV: Nontachycardic RESP: No audible wheezing GI: NABS, rounded, obese, nontender, without rebound MSK/EXT: No lower extremity edema SKIN: No jaundice, skin rashes noted on bilateral upper extremities with some bullous formation NEURO:  Alert & Oriented x 3, no focal deficits   REVIEW OF DATA  I reviewed the following data at the time of this encounter:  GI Procedures and Studies  No new studies to review  Laboratory Studies  Reviewed in epic  Imaging Studies  No relevant studies to review   ASSESSMENT  Ms. Peron is a 58 y.o. 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  The patient is seen today for evaluation and management of:  1. Chronic colitis   2. Lymphocytic colitis   3. Change in bowel habits   4. Fecal urgency   5. Fatty liver    The patient is hemodynamically stable.  Clinically, she has been doing quite well up until the last month.  Etiology of her increased diarrhea likely is underlying lymphocytic colitis but with her history of chronic colitis/UC, need to be thoughtful and mindful of that as well.  We have asked her to increase her Pepto-Bismol in an effort to try and see if that will improve her symptoms.  If it does not, then she will come in for stool studies which we ordered.  She is due for colonoscopy for surveillance of her history of underlying IBD we will plan for that later this year.  If she is not improving with her symptoms, we will plan for an earlier this year.  She is had some intentional weight loss in the setting of the use of Wegovy .  This will be helpful in the setting of underlying hepatic steatosis/MASLD.  Her fib 4 score is low thus I think she is at low risk for issues and we will hold on liver biopsy for now.  We can consider/reconsider this in the future.  The risks and benefits of endoscopic evaluation were discussed with the patient; these include but are not limited to the risk of perforation, infection, bleeding, missed lesions, lack of diagnosis, severe illness requiring hospitalization, as well as anesthesia and sedation related illnesses.  The patient and/or family is agreeable to proceed.  All patient questions were answered to the best of my ability, and the patient agrees to the aforementioned plan of action with follow-up as indicated.   PLAN  Continue Lialda  4.8 g daily Increase bismuth  dosing - Titrate up to 2 pills twice daily if still having issues in a few days may increase to 2 pills 3 times daily and if still having issues increase to 3 pills 3 times daily If still having issues after titration of her bismuth , will need stool studies as outlined below Hopefully she will be feeling better and will perform colonoscopy for surveillance later this year otherwise earlier colonoscopy will be recommended Continue to work on  weight and effort of trying to improve MASLD Hold on liver elastography for now but will consider   Orders Placed This Encounter  Procedures   Stool Culture   Ova and parasite examination   Clostridium difficile Toxin B, Qualitative, Real-Time PCR   CALPROTECTIN   CBC   Comp Met (CMET)   Sedimentation rate   C-reactive protein   Ambulatory referral to Gastroenterology    New Prescriptions   NA SULFATE-K SULFATE-MG SULFATE CONCENTRATE (SUPREP BOWEL PREP KIT) 17.5-3.13-1.6 GM/177ML SOLN    Take 1 kit (354 mLs total) by mouth as directed. For colonoscopy prep    Planned Follow Up: No follow-ups on file.   Total Time in Face-to-Face and in Coordination of Care for patient including independent/personal interpretation/review of prior testing, medical history, examination, medication adjustment, communicating results with the patient directly, and documentation with the EHR is 25 minutes.   Yong Henle, MD [facility] Advanced Endoscopy Office # [phone]

## 2023-05-11 ENCOUNTER — Encounter: Payer: Self-pay | Admitting: Gastroenterology

## 2023-05-11 DIAGNOSIS — R194 Change in bowel habit: Secondary | ICD-10-CM | POA: Insufficient documentation

## 2023-05-13 ENCOUNTER — Telehealth (INDEPENDENT_AMBULATORY_CARE_PROVIDER_SITE_OTHER): Admitting: Psychiatry

## 2023-05-13 ENCOUNTER — Encounter: Payer: Self-pay | Admitting: Psychiatry

## 2023-05-13 DIAGNOSIS — F603 Borderline personality disorder: Secondary | ICD-10-CM | POA: Diagnosis not present

## 2023-05-13 DIAGNOSIS — F3342 Major depressive disorder, recurrent, in full remission: Secondary | ICD-10-CM | POA: Diagnosis not present

## 2023-05-13 DIAGNOSIS — G4733 Obstructive sleep apnea (adult) (pediatric): Secondary | ICD-10-CM

## 2023-05-13 DIAGNOSIS — F411 Generalized anxiety disorder: Secondary | ICD-10-CM | POA: Diagnosis not present

## 2023-05-13 DIAGNOSIS — F5081 Binge eating disorder, mild: Secondary | ICD-10-CM | POA: Diagnosis not present

## 2023-05-13 MED ORDER — CARIPRAZINE HCL 3 MG PO CAPS: 3.0000 mg | ORAL_CAPSULE | Freq: Every day | ORAL | 1 refills | Status: DC

## 2023-05-13 NOTE — Progress Notes (Signed)
 Virtual Visit via Video Note  I connected with Leslie Douglas on 05/13/23 at 10:00 AM EDT by a video enabled telemedicine application and verified that I am speaking with the correct person using two identifiers.  Location Provider Location : [facility] Patient Location : Home  Participants: Patient , Provider   I discussed the limitations of evaluation and management by telemedicine and the availability of in person appointments. The patient expressed understanding and agreed to proceed.   I discussed the assessment and treatment plan with the patient. The patient was provided an opportunity to ask questions and all were answered. The patient agreed with the plan and demonstrated an understanding of the instructions.   The patient was advised to call back or seek an in-person evaluation if the symptoms worsen or if the condition fails to improve as anticipated.   BH MD OP Progress Note  05/13/2023 10:19 AM Leslie Douglas  MRN:  [id number]  Chief Complaint:  Chief Complaint  Patient presents with   Follow-up   Anxiety   Depression   Medication Refill   Discussed the use of AI scribe software for clinical note transcription with the patient, who gave verbal consent to proceed.  History of Present Illness Leslie Douglas is a 58 year old Caucasian female, employed, separated, lives in [place], has a history of MDD, GAD, binge eating disorder, borderline personality disorder was evaluated by telemedicine today for a follow-up appointment for medication management.  She started Wegovy  on April 07, 2023, and has lost approximately 12 pounds since then. She experiences occasional nausea, particularly when brushing her teeth, but otherwise reports no significant side effects. She is currently on the smallest dose of Wegovy .  Her psychiatric history includes depression and anxiety, for which she is taking Vraylar  3 mg and Zoloft  100 mg, both at nighttime. She experiences no muscle  spasms, tremors, or stiffness from Vraylar . Propranolol  10 mg is used as needed for social anxiety, though not daily.Sleep gummies containing melatonin and L-theanine have significantly improved her sleep quality.  She has a history of obstructive sleep apnea and previously had issues with her CPAP mask due to mouth breathing and mask seal breaking. Currently, she is sleeping well without the CPAP, aided by her medication regimen and sleep gummies.  She underwent a cardiology evaluation due to EKG variations, which included several tests that returned normal results. Her calcium score was zero, indicating no evidence of coronary artery disease.  She currently denies any suicidality, homicidality or perceptual disturbances.  She continues to be in psychotherapy sessions every 3 weeks and is motivated to stay in therapy.  Visit Diagnosis:    ICD-10-CM   1. Recurrent major depressive disorder, in full remission (HCC)  F33.42 cariprazine  (VRAYLAR ) 3 MG capsule    2. GAD (generalized anxiety disorder)  F41.1     3. Mild binge-eating disorder  F50.810     4. Borderline personality disorder (HCC)  F60.3     5. OSA (obstructive sleep apnea)  G47.33       Past Psychiatric History: I have reviewed past psychiatric history from progress note on 05/19/2021.  Past Medical History:  Past Medical History:  Diagnosis Date   Allergy     Anxiety    Arthritis    Back pain    Binge eating disorder    Borderline personality disorder (HCC)    COPD (chronic obstructive pulmonary disease) (HCC)    chronic bronchitis   Depression    Edema, lower extremity  Essential hypertension 04/05/2023   Hip pain    History of chicken pox    Hyperlipidemia 04/05/2023   Joint pain    Memory loss    Pancolitis (HCC)    Poor concentration    Seasonal allergies    Skin cancer, basal cell    basal cell   Tinnitus    Vitamin D  deficiency     Past Surgical History:  Procedure Laterality Date   CHOLECYSTECTOMY   2003   MOHS SURGERY  10/2017   SKIN SURGERY  05/2020   nose skin cancer   VAGINAL DELIVERY  1995   WISDOM TOOTH EXTRACTION  12/2017    Family Psychiatric History: I have reviewed family psychiatric history from progress note on 05/19/2021.  Family History:  Family History  Problem Relation Age of Onset   Hypertension Mother    Diabetes Father    Colon cancer Neg Hx    Esophageal cancer Neg Hx    Rectal cancer Neg Hx    Stomach cancer Neg Hx    Pancreatic cancer Neg Hx    Inflammatory bowel disease Neg Hx    Liver disease Neg Hx    Allergic rhinitis Neg Hx    Angioedema Neg Hx    Asthma Neg Hx    Eczema Neg Hx    Urticaria Neg Hx    Immunodeficiency Neg Hx    Atopy Neg Hx    Mental illness Neg Hx    Sleep apnea Neg Hx     Social History: I have reviewed social history from progress note on 05/19/2021. Social History   Socioeconomic History   Marital status: Divorced    Spouse name: Not on file   Number of children: 3   Years of education: Not on file   Highest education level: Associate degree: academic program  Occupational History   Occupation: Aeronautical engineer    Comment: [employer]  Tobacco Use   Smoking status: Never   Smokeless tobacco: Never  Vaping Use   Vaping status: Never Used  Substance and Sexual Activity   Alcohol use: Not Currently    Comment: seldom (alcohol intolerance?)   Drug use: Never   Sexual activity: Not Currently    Birth control/protection: Abstinence  Other Topics Concern   Not on file  Social History Narrative   Works for [employer], has Associate Degree   3 children   Right handed    Social Drivers of Corporate investment banker Strain: Low Risk  (02/01/2023)   Overall Financial Resource Strain (CARDIA)    Difficulty of Paying Living Expenses: Not very hard  Food Insecurity: No Food Insecurity (02/01/2023)   Hunger Vital Sign    Worried About Running Out of Food in the Last Year: Never true    Ran Out of Food in the  Last Year: Never true  Transportation Needs: No Transportation Needs (02/01/2023)   PRAPARE - Administrator, Civil Service (Medical): No    Lack of Transportation (Non-Medical): No  Physical Activity: Insufficiently Active (02/01/2023)   Exercise Vital Sign    Days of Exercise per Week: 1 day    Minutes of Exercise per Session: 10 min  Stress: No Stress Concern Present (02/01/2023)   [employer] of Occupational Health - Occupational Stress Questionnaire    Feeling of Stress : Only a little  Social Connections: Socially Isolated (02/01/2023)   Social Connection and Isolation Panel [NHANES]    Frequency of Communication with Friends and Family:  Twice a week    Frequency of Social Gatherings with Friends and Family: Once a week    Attends Religious Services: Never    Database administrator or Organizations: No    Attends Engineer, structural: Not on file    Marital Status: Divorced    Allergies:  Allergies  Allergen Reactions   Doxycycline Rash and Swelling    Mouth and face   Erythromycin Shortness Of Breath   Molds & Smuts Rash   Sulfa Antibiotics Shortness Of Breath   Emsam [Selegiline] Dermatitis    Allergic reaction to patch   Saxenda  [Liraglutide  -Weight Management] Nausea Only   Penicillins Rash    Metabolic Disorder Labs: Lab Results  Component Value Date   HGBA1C 5.4 05/27/2021   Lab Results  Component Value Date   PROLACTIN 3.6 (L) 05/27/2021   Lab Results  Component Value Date   CHOL 260 (H) 11/30/2022   TRIG 250.0 (H) 11/30/2022   HDL 51.90 11/30/2022   CHOLHDL 5 11/30/2022   VLDL 50.0 (H) 11/30/2022   LDLCALC 158 (H) 11/30/2022   LDLCALC 140 (H) 03/31/2021   Lab Results  Component Value Date   TSH 2.000 05/27/2021   TSH 2.32 03/27/2019    Therapeutic Level Labs: No results found for: "LITHIUM" No results found for: "VALPROATE" No results found for: "CBMZ"  Current Medications: Current Outpatient Medications   Medication Sig Dispense Refill   bismuth  subsalicylate (PEPTO BISMOL) 262 MG chewable tablet Chew 262 mg by mouth 2 (two) times daily.     cariprazine  (VRAYLAR ) 3 MG capsule Take 1 capsule (3 mg total) by mouth daily. 90 capsule 1   diclofenac Sodium (VOLTAREN) 1 % GEL Apply 2 g topically as needed.     EPINEPHrine  (EPIPEN  2-PAK) 0.3 mg/0.3 mL IJ SOAJ injection Inject 0.3 mLs (0.3 mg total) into the muscle as needed for anaphylaxis. 1 each 1   fluticasone  (FLONASE ) 50 MCG/ACT nasal spray Place 2 sprays into both nostrils daily as needed for allergies or rhinitis. 16 g 5   hydrOXYzine  (VISTARIL ) 25 MG capsule Take 1-2 capsules (25-50 mg total) by mouth at bedtime as needed. For sleep 60 capsule 1   ipratropium (ATROVENT ) 0.03 % nasal spray Place 1 spray into both nostrils 4 (four) times daily. Use as directed. 30 mL 5   levocetirizine (XYZAL ) 5 MG tablet Take 1 tablet (5 mg total) by mouth every evening. 30 tablet 5   losartan -hydrochlorothiazide (HYZAAR) 50-12.5 MG tablet TAKE 1 TABLET BY MOUTH DAILY 90 tablet 1   mesalamine  (LIALDA ) 1.2 g EC tablet TAKE FOUR TABLETS BY MOUTH DAILY WITH BREAKFAST 120 tablet 2   mupirocin  ointment (BACTROBAN ) 2 % Apply 1 Application topically 2 (two) times daily. 22 g 0   Na Sulfate-K Sulfate-Mg Sulfate concentrate (SUPREP BOWEL PREP KIT) 17.5-3.13-1.6 GM/177ML SOLN Take 1 kit (354 mLs total) by mouth as directed. For colonoscopy prep 354 mL 0   propranolol  (INDERAL ) 10 MG tablet Take 1 tablet (10 mg total) by mouth 3 (three) times daily as needed. For anxiety 90 tablet 1   Semaglutide -Weight Management (WEGOVY ) 0.25 MG/0.5ML SOAJ Inject 0.25 mg into the skin once a week. 2 mL 1   sertraline  (ZOLOFT ) 100 MG tablet TAKE 1 TABLET BY MOUTH DAILY WITH BREAKFAST 90 tablet 1   No current facility-administered medications for this visit.     Musculoskeletal: Strength & Muscle Tone:  UTA Gait & Station:  Seated Patient leans: N/A  Psychiatric Specialty  Exam:  Review of Systems  Psychiatric/Behavioral: Negative.      There were no vitals taken for this visit.There is no height or weight on file to calculate BMI.  General Appearance: Casual  Eye Contact:  Fair  Speech:  Clear and Coherent  Volume:  Normal  Mood:  Euthymic  Affect:  Congruent  Thought Process:  Goal Directed and Descriptions of Associations: Intact  Orientation:  Full (Time, Place, and Person)  Thought Content: Logical   Suicidal Thoughts:  No  Homicidal Thoughts:  No  Memory:  Immediate;   Fair Recent;   Fair Remote;   Fair  Judgement:  Fair  Insight:  Fair  Psychomotor Activity:  Normal  Concentration:  Concentration: Fair and Attention Span: Fair  Recall:  [employer] of Knowledge: Fair  Language: Fair  Akathisia:  No  Handed:  Right  AIMS (if indicated): not done  Assets:  Desire for Improvement Housing Social Support Transportation  ADL's:  Intact  Cognition: WNL  Sleep:  Fair   Screenings: Midwife Visit from 09/02/2022 in [facility] Office Visit from 06/01/2022 in [facility] Office Visit from 03/04/2022 in [facility] Office Visit from 11/06/2021 in [facility] Office Visit from 10/01/2021 in [facility]  AIMS Total Score 0 0 0 0 0      GAD-7    Flowsheet Row Office Visit from 02/02/2023 in [facility] [employer] from 11/30/2022 in [facility] [employer] [facility] [employer] from 09/02/2022 in [facility] Office Visit from 07/21/2022 in [facility] [place] [facility] [place] Office Visit from 06/01/2022 in [facility]  Total GAD-7 Score 5 5 4 5  0      PHQ2-9    Flowsheet Row  Office Visit from 02/02/2023 in [facility] [place] Office Visit from 11/30/2022 in [facility] [employer] Visit from 09/02/2022 in [facility] Office Visit from 07/21/2022 in [facility] [address] [place] Office Visit from 06/01/2022 in [facility]  PHQ-2 Total Score 4 4 2 2 2   PHQ-9 Total Score 13 11 7 7 7       Flowsheet Row Video Visit from 05/13/2023 in [facility] Video Visit from 02/17/2023 in [facility] Video Visit from 12/30/2022 in [facility]  C-SSRS RISK CATEGORY No Risk No Risk No Risk        Assessment and Plan:Hidaya K Cripe 58 year old Caucasian female, has a history of MDD, binge eating disorder, obstructive sleep apnea, borderline personality disorder was evaluated by telemedicine today, discussed assessment and plan as noted below.  Assessment & Plan Depression in remission Depression is well-managed with Vraylar  3 mg and Zoloft  100 mg, both administered at night. She reports no symptoms or side effects and is in a good mood. - Continue Vraylar  3 mg daily - Continue Sertraline  100 mg daily - Continue CBT with therapist at [facility] counseling every 3 weeks.  Generalized anxiety disorder-stable Anxiety is managed with sertraline , propranolol  10 mg as needed for social anxiety. She reports infrequent use of propranolol .  Therapy also has been beneficial. - Continue Sertraline   100 mg daily - Continue Propranolol  10 mg 3 times a day as needed - Continue Hydroxyzine  25 to 50 mg at bedtime as needed  Borderline Personality Disorder-improving Borderline personality disorder is well-managed with therapy sessions approximately every three weeks. - Continue psychotherapy sessions every 3 weeks.  Binge  Eating Disorder-stable Binge eating disorder currently stable.  Is being treated with Wegovy  for weight loss since April 07, 2023, resulting in a 12-pound weight loss. She experiences occasional nausea but no other significant side effects. She is on the smallest dose and is informed of potential side effects, including depression. - Continue CBT and monitor her eating habits.  Obstructive Sleep Apnea-improving Obstructive sleep apnea is managed without CPAP, using melatonin and L-theanine gummies. She reports good sleep quality and has an upcoming appointment to discuss CPAP issues. - Encouraged use of CPAP  Follow-up Follow-up in clinic in 2 to 3 months or sooner in person.   Collaboration of Care: Collaboration of Care: Referral or follow-up with counselor/therapist AEB encouraged to continue CBT.  Patient/Guardian was advised Release of Information must be obtained prior to any record release in order to collaborate their care with an outside provider. Patient/Guardian was advised if they have not already done so to contact the registration department to sign all necessary forms in order for us  to release information regarding their care.   Consent: Patient/Guardian gives verbal consent for treatment and assignment of benefits for services provided during this visit. Patient/Guardian expressed understanding and agreed to proceed.   This note was generated in part or whole with voice recognition software. Voice recognition is usually quite accurate but there are transcription errors that can and very often do occur. I apologize for any typographical errors that were not detected and corrected.    Damascus Feldpausch, MD 05/13/2023, 10:19 AM

## 2023-05-23 ENCOUNTER — Other Ambulatory Visit: Payer: Self-pay | Admitting: Gastroenterology

## 2023-05-31 ENCOUNTER — Encounter: Payer: Self-pay | Admitting: Physician Assistant

## 2023-05-31 ENCOUNTER — Ambulatory Visit: Admitting: Physician Assistant

## 2023-05-31 ENCOUNTER — Other Ambulatory Visit: Payer: Self-pay | Admitting: Physician Assistant

## 2023-05-31 MED ORDER — WEGOVY 0.5 MG/0.5ML ~~LOC~~ SOAJ: 0.5000 mg | SUBCUTANEOUS | 0 refills | Status: DC

## 2023-06-10 ENCOUNTER — Ambulatory Visit: Payer: Self-pay

## 2023-06-10 NOTE — Telephone Encounter (Signed)
 Noted

## 2023-06-10 NOTE — Telephone Encounter (Signed)
 Summary: Low grade fever   Copied From CRM [id number]. Reason for Triage: Upper respiratory infection, chest congestion and low grade fever.  [place] [id number]         Chief Complaint: sore throat, fever, cough, runny nose Symptoms: see above Frequency: since May 16 Pertinent Negatives: Patient denies cp, sob Disposition: [] ED /[] Urgent Care (no appt availability in office) / [x] Appointment(In office/virtual)/ []  [facility]/ [] Home Care/ [] Refused Recommended Disposition /[] [facility]/ []  Follow-up with PCP Additional Notes: apt made for next week; care advice given, denies questions; instructed to go to ER if becomes worse.   Reason for Disposition  [1] Sinus congestion (pressure, fullness) AND [2] present > 10 days  Answer Assessment - Initial Assessment Questions 1. ONSET: "When did the nasal discharge start?"      Since May 16 2. AMOUNT: "How much discharge is there?"      Large amount and yellow in color 3. COUGH: "Do you have a cough?" If Yes, ask: "Describe the color of your sputum" (clear, white, yellow, green)     Yes, yellow 4. RESPIRATORY DISTRESS: "Describe your breathing."      When she lays down 5. FEVER: "Do you have a fever?" If Yes, ask: "What is your temperature, how was it measured, and when did it start?"     Low grade; 99. something 6. SEVERITY: "Overall, how bad are you feeling right now?" (e.g., doesn't interfere with normal activities, staying home from school/work, staying in bed)      Moderate to severe 7. OTHER SYMPTOMS: "Do you have any other symptoms?" (e.g., sore throat, earache, wheezing, vomiting)     Sore throat 8. PREGNANCY: "Is there any chance you are pregnant?" "When was your last menstrual period?"     na  Protocols used: Common Cold-A-AH

## 2023-06-13 ENCOUNTER — Ambulatory Visit: Admitting: Physician Assistant

## 2023-06-13 ENCOUNTER — Encounter: Payer: Self-pay | Admitting: Physician Assistant

## 2023-06-13 ENCOUNTER — Encounter: Payer: Self-pay | Admitting: Gastroenterology

## 2023-06-13 VITALS — BP 105/75 | HR 98 | Temp 97.1°F | Ht 66.0 in | Wt 287.5 lb

## 2023-06-13 DIAGNOSIS — R051 Acute cough: Secondary | ICD-10-CM

## 2023-06-13 MED ORDER — ALBUTEROL SULFATE HFA 108 (90 BASE) MCG/ACT IN AERS: 2.0000 | INHALATION_SPRAY | Freq: Four times a day (QID) | RESPIRATORY_TRACT | 0 refills | Status: DC | PRN

## 2023-06-13 MED ORDER — AZITHROMYCIN 250 MG PO TABS: ORAL_TABLET | ORAL | 0 refills | Status: AC

## 2023-06-13 NOTE — Progress Notes (Signed)
 Leslie Douglas is a 58 y.o. female here for a new problem.  History of Present Illness:   Chief Complaint  Patient presents with   Cough    Pt c/o cough since May 16th, expectorating yellow sputum, also yellow nasal drainage and eyes are crusted in the morning. Pt was seen [facility] on 5/20 was given Tessalon  capsules.   Cough: Pt complains of productive cough, sinus congestion, sinus pressure, runny nose, wheezing, and sore throat, starting 5/16. Overall her cough and other symptoms have not improved.  Associated symptoms include crust around the eyes, yellow sputum production, and fluctuations of feeling hot/cold.  Reports she had a fever at the [facility] and was prescribed tessalon  pearls at the time.  Used to have an inhaler at home, but has not had for some time.  Unsure of possible exposure, but notes she went to [employer] 3 days prior to onset of symptoms.   Past Medical History:  Diagnosis Date   Allergy     Anxiety    Arthritis    Back pain    Binge eating disorder    Borderline personality disorder (HCC)    COPD (chronic obstructive pulmonary disease) (HCC)    chronic bronchitis   Depression    Edema, lower extremity    Essential hypertension 04/05/2023   Hip pain    History of chicken pox    Hyperlipidemia 04/05/2023   Joint pain    Memory loss    Pancolitis (HCC)    Poor concentration    Seasonal allergies    Skin cancer, basal cell    basal cell   Tinnitus    Vitamin D  deficiency      Social History   Tobacco Use   Smoking status: Never   Smokeless tobacco: Never  Vaping Use   Vaping status: Never Used  Substance Use Topics   Alcohol use: Not Currently    Comment: seldom (alcohol intolerance?)   Drug use: Never    Past Surgical History:  Procedure Laterality Date   CHOLECYSTECTOMY  2003   MOHS SURGERY  10/2017   SKIN SURGERY  05/2020   nose skin cancer   VAGINAL DELIVERY  1995   WISDOM TOOTH EXTRACTION  12/2017    Family History   Problem Relation Age of Onset   Hypertension Mother    Diabetes Father    Colon cancer Neg Hx    Esophageal cancer Neg Hx    Rectal cancer Neg Hx    Stomach cancer Neg Hx    Pancreatic cancer Neg Hx    Inflammatory bowel disease Neg Hx    Liver disease Neg Hx    Allergic rhinitis Neg Hx    Angioedema Neg Hx    Asthma Neg Hx    Eczema Neg Hx    Urticaria Neg Hx    Immunodeficiency Neg Hx    Atopy Neg Hx    Mental illness Neg Hx    Sleep apnea Neg Hx     Allergies  Allergen Reactions   Doxycycline Rash and Swelling    Mouth and face   Erythromycin Shortness Of Breath   Molds & Smuts Rash   Sulfa Antibiotics Shortness Of Breath   Emsam [Selegiline] Dermatitis    Allergic reaction to patch   Saxenda  [Liraglutide  -Weight Management] Nausea Only   Penicillins Rash    Current Medications:   Current Outpatient Medications:    albuterol (VENTOLIN HFA) 108 (90 Base) MCG/ACT inhaler, Inhale 2 puffs into the  lungs every 6 (six) hours as needed for wheezing or shortness of breath., Disp: 8 g, Rfl: 0   azithromycin  (ZITHROMAX ) 250 MG tablet, Take 2 tablets on day 1, then 1 tablet daily on days 2 through 5, Disp: 6 tablet, Rfl: 0   bismuth  subsalicylate (PEPTO BISMOL) 262 MG chewable tablet, Chew 262 mg by mouth 2 (two) times daily., Disp: , Rfl:    cariprazine  (VRAYLAR ) 3 MG capsule, Take 1 capsule (3 mg total) by mouth daily., Disp: 90 capsule, Rfl: 1   diclofenac Sodium (VOLTAREN) 1 % GEL, Apply 2 g topically as needed., Disp: , Rfl:    EPINEPHrine  (EPIPEN  2-PAK) 0.3 mg/0.3 mL IJ SOAJ injection, Inject 0.3 mLs (0.3 mg total) into the muscle as needed for anaphylaxis., Disp: 1 each, Rfl: 1   fluticasone  (FLONASE ) 50 MCG/ACT nasal spray, Place 2 sprays into both nostrils daily as needed for allergies or rhinitis., Disp: 16 g, Rfl: 5   hydrOXYzine  (VISTARIL ) 25 MG capsule, Take 1-2 capsules (25-50 mg total) by mouth at bedtime as needed. For sleep, Disp: 60 capsule, Rfl: 1    ipratropium (ATROVENT ) 0.03 % nasal spray, Place 1 spray into both nostrils 4 (four) times daily. Use as directed., Disp: 30 mL, Rfl: 5   levocetirizine (XYZAL ) 5 MG tablet, Take 1 tablet (5 mg total) by mouth every evening., Disp: 30 tablet, Rfl: 5   losartan -hydrochlorothiazide (HYZAAR) 50-12.5 MG tablet, TAKE 1 TABLET BY MOUTH DAILY, Disp: 90 tablet, Rfl: 1   mesalamine  (LIALDA ) 1.2 g EC tablet, TAKE FOUR TABLETS BY MOUTH DAILY WITH BREAKFAST, Disp: 120 tablet, Rfl: 2   mupirocin  ointment (BACTROBAN ) 2 %, Apply 1 Application topically 2 (two) times daily., Disp: 22 g, Rfl: 0   propranolol  (INDERAL ) 10 MG tablet, Take 1 tablet (10 mg total) by mouth 3 (three) times daily as needed. For anxiety, Disp: 90 tablet, Rfl: 1   Semaglutide -Weight Management (WEGOVY ) 0.5 MG/0.5ML SOAJ, Inject 0.5 mg into the skin once a week., Disp: 2 mL, Rfl: 0   sertraline  (ZOLOFT ) 100 MG tablet, TAKE 1 TABLET BY MOUTH DAILY WITH BREAKFAST, Disp: 90 tablet, Rfl: 1   Na Sulfate-K Sulfate-Mg Sulfate concentrate (SUPREP BOWEL PREP KIT) 17.5-3.13-1.6 GM/177ML SOLN, Take 1 kit (354 mLs total) by mouth as directed. For colonoscopy prep (Patient not taking: Reported on 06/13/2023), Disp: 354 mL, Rfl: 0   Review of Systems:   Negative unless otherwise specified per HPI.  Vitals:   Vitals:   06/13/23 1053  BP: 105/75  Pulse: 98  Temp: (!) 97.1 F (36.2 C)  TempSrc: Temporal  SpO2: 94%  Weight: 287 lb 8 oz (130.4 kg)  Height: 5\' 6"  (1.676 m)     Body mass index is 46.4 kg/m.  Physical Exam:   Physical Exam Vitals and nursing note reviewed.  Constitutional:      General: She is not in acute distress.    Appearance: She is well-developed. She is not ill-appearing or toxic-appearing.  HENT:     Head: Normocephalic and atraumatic.     Right Ear: Ear canal and external ear normal. A middle ear effusion is present. Tympanic membrane is not erythematous, retracted or bulging.     Left Ear: Ear canal and external ear  normal. A middle ear effusion is present. Tympanic membrane is not erythematous, retracted or bulging.     Nose: Nose normal.     Right Sinus: No maxillary sinus tenderness or frontal sinus tenderness.     Left Sinus: No maxillary  sinus tenderness or frontal sinus tenderness.     Mouth/Throat:     Pharynx: Uvula midline. No posterior oropharyngeal erythema.  Eyes:     General: Lids are normal.     Conjunctiva/sclera: Conjunctivae normal.  Neck:     Trachea: Trachea normal.  Cardiovascular:     Rate and Rhythm: Normal rate and regular rhythm.     Heart sounds: Normal heart sounds, S1 normal and S2 normal.  Pulmonary:     Effort: Pulmonary effort is normal.     Breath sounds: Normal breath sounds. No decreased breath sounds, wheezing, rhonchi or rales.  Lymphadenopathy:     Cervical: No cervical adenopathy.  Skin:    General: Skin is warm and dry.  Neurological:     Mental Status: She is alert.  Psychiatric:        Speech: Speech normal.        Behavior: Behavior normal. Behavior is cooperative.     Assessment and Plan:   1. Acute cough (Primary)   No red flags on exam.   Will initiate z-pack (she reports that she CAN tolerate this prescription despite erythromycin allergy ) per orders.  Refill albuterol Discussed taking medications as prescribed.  Reviewed return precautions including new or worsening fever, SOB, new or worsening cough or other concerns.  Push fluids and rest.  I recommend that patient follow-up if symptoms worsen or persist despite treatment x 7-10 days, sooner if needed.   I, Bernita Bristle, acting as a Neurosurgeon for Alexander Iba, [place]., have documented all relevant documentation on the behalf of Alexander Iba, [place], as directed by   while in the presence of Alexander Iba, [place].  I, Alexander Iba, [place], have reviewed all documentation for this visit. The documentation on 06/13/23 for the exam, diagnosis, procedures, and orders are all accurate and  complete.  Alexander Iba, PA-C

## 2023-06-13 NOTE — Patient Instructions (Signed)
 It was great to see you!  Start azithromycin  and albuterol as needed  If no improvement, let me know   Take care,  Alexander Iba PA-C

## 2023-06-14 ENCOUNTER — Telehealth: Payer: Self-pay | Admitting: Gastroenterology

## 2023-06-14 NOTE — Telephone Encounter (Signed)
 OK. No late cancellation fee at this time. GM

## 2023-06-14 NOTE — Telephone Encounter (Signed)
 Good morning Dr. Brice Campi, I received a call from this patient requesting to reschedule her appointment that she has on June the 10 th. Patient stated that she has been sick since May the 16 th and has not seemed to have got better. Patient was reschedule for June the 27 th at 2:30 PM. Please advise.

## 2023-06-21 ENCOUNTER — Encounter: Admitting: Gastroenterology

## 2023-06-28 ENCOUNTER — Ambulatory Visit: Admitting: Physician Assistant

## 2023-06-28 VITALS — BP 130/76 | HR 81 | Temp 97.3°F | Ht 66.0 in | Wt 289.2 lb

## 2023-06-28 DIAGNOSIS — E669 Obesity, unspecified: Secondary | ICD-10-CM

## 2023-06-28 MED ORDER — WEGOVY 1 MG/0.5ML ~~LOC~~ SOAJ: 1.0000 mg | SUBCUTANEOUS | 2 refills | Status: DC

## 2023-06-28 NOTE — Progress Notes (Signed)
 Leslie Douglas is a 58 y.o. female here for a follow up of a pre-existing problem.  History of Present Illness:   Chief Complaint  Patient presents with   Weight Management Screening    Pt here for f/u on Wegovy , currently on 0.5 mg dose.   Weight management: Pt started Wegovy  on 3/27 and on 5/20 reported 15 lbs weight loss via MyChart message.  Today, she is compliant with Wegovy  0.5 mg once weekly.  Tolerating well with no adverse side effects reported.  No side effects when increasing from 0.25 mg to 0.5 mg dose.  Denies any diarrhea, constipation, or GI upset.   She reports feeling the clinical benefits overall.  She is still recovering from a recent cold and reports decreased appetite as a result.  Has been working on [employer] and exercise (walking).  She follows up with a dietician and reports her weight once monthly through [facility] to remain compliant.  Of note, pt has a colonoscopy scheduled on 07/08/23.  Past Medical History:  Diagnosis Date   Allergy     Anxiety    Arthritis    Back pain    Binge eating disorder    Borderline personality disorder (HCC)    COPD (chronic obstructive pulmonary disease) (HCC)    chronic bronchitis   Depression    Edema, lower extremity    Essential hypertension 04/05/2023   Hip pain    History of chicken pox    Hyperlipidemia 04/05/2023   Joint pain    Memory loss    Pancolitis (HCC)    Poor concentration    Seasonal allergies    Skin cancer, basal cell    basal cell   Tinnitus    Vitamin D  deficiency      Social History   Tobacco Use   Smoking status: Never   Smokeless tobacco: Never  Vaping Use   Vaping status: Never Used  Substance Use Topics   Alcohol use: Not Currently    Comment: seldom (alcohol intolerance?)   Drug use: Never    Past Surgical History:  Procedure Laterality Date   CHOLECYSTECTOMY  2003   MOHS SURGERY  10/2017   SKIN SURGERY  05/2020   nose skin cancer   VAGINAL DELIVERY  1995   WISDOM  TOOTH EXTRACTION  12/2017    Family History  Problem Relation Age of Onset   Hypertension Mother    Diabetes Father    Colon cancer Neg Hx    Esophageal cancer Neg Hx    Rectal cancer Neg Hx    Stomach cancer Neg Hx    Pancreatic cancer Neg Hx    Inflammatory bowel disease Neg Hx    Liver disease Neg Hx    Allergic rhinitis Neg Hx    Angioedema Neg Hx    Asthma Neg Hx    Eczema Neg Hx    Urticaria Neg Hx    Immunodeficiency Neg Hx    Atopy Neg Hx    Mental illness Neg Hx    Sleep apnea Neg Hx     Allergies  Allergen Reactions   Doxycycline Rash and Swelling    Mouth and face   Erythromycin Shortness Of Breath   Molds & Smuts Rash   Sulfa Antibiotics Shortness Of Breath   Emsam [Selegiline] Dermatitis    Allergic reaction to patch   Saxenda  [Liraglutide  -Weight Management] Nausea Only   Penicillins Rash    Current Medications:   Current Outpatient Medications:  albuterol  (VENTOLIN  HFA) 108 (90 Base) MCG/ACT inhaler, Inhale 2 puffs into the lungs every 6 (six) hours as needed for wheezing or shortness of breath., Disp: 8 g, Rfl: 0   bismuth  subsalicylate (PEPTO BISMOL) 262 MG chewable tablet, Chew 262 mg by mouth 2 (two) times daily., Disp: , Rfl:    cariprazine  (VRAYLAR ) 3 MG capsule, Take 1 capsule (3 mg total) by mouth daily., Disp: 90 capsule, Rfl: 1   diclofenac Sodium (VOLTAREN) 1 % GEL, Apply 2 g topically as needed., Disp: , Rfl:    EPINEPHrine  (EPIPEN  2-PAK) 0.3 mg/0.3 mL IJ SOAJ injection, Inject 0.3 mLs (0.3 mg total) into the muscle as needed for anaphylaxis., Disp: 1 each, Rfl: 1   fluticasone  (FLONASE ) 50 MCG/ACT nasal spray, Place 2 sprays into both nostrils daily as needed for allergies or rhinitis., Disp: 16 g, Rfl: 5   hydrOXYzine  (VISTARIL ) 25 MG capsule, Take 1-2 capsules (25-50 mg total) by mouth at bedtime as needed. For sleep, Disp: 60 capsule, Rfl: 1   ipratropium (ATROVENT ) 0.03 % nasal spray, Place 1 spray into both nostrils 4 (four) times  daily. Use as directed., Disp: 30 mL, Rfl: 5   levocetirizine (XYZAL ) 5 MG tablet, Take 1 tablet (5 mg total) by mouth every evening., Disp: 30 tablet, Rfl: 5   losartan -hydrochlorothiazide (HYZAAR) 50-12.5 MG tablet, TAKE 1 TABLET BY MOUTH DAILY, Disp: 90 tablet, Rfl: 1   mesalamine  (LIALDA ) 1.2 g EC tablet, TAKE FOUR TABLETS BY MOUTH DAILY WITH BREAKFAST, Disp: 120 tablet, Rfl: 2   mupirocin  ointment (BACTROBAN ) 2 %, Apply 1 Application topically 2 (two) times daily., Disp: 22 g, Rfl: 0   propranolol  (INDERAL ) 10 MG tablet, Take 1 tablet (10 mg total) by mouth 3 (three) times daily as needed. For anxiety, Disp: 90 tablet, Rfl: 1   Semaglutide -Weight Management (WEGOVY ) 1 MG/0.5ML SOAJ, Inject 1 mg into the skin once a week., Disp: 2 mL, Rfl: 2   sertraline  (ZOLOFT ) 100 MG tablet, TAKE 1 TABLET BY MOUTH DAILY WITH BREAKFAST, Disp: 90 tablet, Rfl: 1   Na Sulfate-K Sulfate-Mg Sulfate concentrate (SUPREP BOWEL PREP KIT) 17.5-3.13-1.6 GM/177ML SOLN, Take 1 kit (354 mLs total) by mouth as directed. For colonoscopy prep (Patient not taking: Reported on 06/28/2023), Disp: 354 mL, Rfl: 0   Review of Systems:   Negative unless otherwise specified per HPI.  Vitals:   Vitals:   06/28/23 1342  BP: 130/76  Pulse: 81  Temp: (!) 97.3 F (36.3 C)  TempSrc: Temporal  SpO2: 98%  Weight: 289 lb 4 oz (131.2 kg)  Height: 5' 6 (1.676 m)     Body mass index is 46.69 kg/m.  Physical Exam:   Physical Exam Constitutional:      Appearance: Normal appearance. She is well-developed.  HENT:     Head: Normocephalic and atraumatic.   Eyes:     General: Lids are normal.     Extraocular Movements: Extraocular movements intact.     Conjunctiva/sclera: Conjunctivae normal.   Pulmonary:     Effort: Pulmonary effort is normal.   Musculoskeletal:        General: Normal range of motion.     Cervical back: Normal range of motion and neck supple.   Skin:    General: Skin is warm and dry.   Neurological:      Mental Status: She is alert and oriented to person, place, and time.   Psychiatric:        Attention and Perception: Attention and perception normal.  Mood and Affect: Mood normal.        Behavior: Behavior normal.        Thought Content: Thought content normal.        Judgment: Judgment normal.     Assessment and Plan:   1. Obesity, unspecified class, unspecified obesity type, unspecified whether serious comorbidity present (Primary)   Doing well Will increase Wegovy  to 1 mg weekly Follow up in 3-4 months for Comprehensive Physical Exam (CPE) preventive care annual visit- sooner if concerns  I, Bernita Bristle, acting as a Neurosurgeon for Alexander Iba, [place]., have documented all relevant documentation on the behalf of Alexander Iba, [place], as directed by   while in the presence of Alexander Iba, [place].  I, Alexander Iba, [place], have reviewed all documentation for this visit. The documentation on 06/28/23 for the exam, diagnosis, procedures, and orders are all accurate and complete.  Alexander Iba, PA-C

## 2023-06-28 NOTE — Patient Instructions (Addendum)
 Leslie Douglas

## 2023-06-29 ENCOUNTER — Ambulatory Visit: Admitting: Cardiovascular Disease

## 2023-07-04 ENCOUNTER — Telehealth: Payer: Self-pay | Admitting: Gastroenterology

## 2023-07-04 NOTE — Telephone Encounter (Signed)
 Patient called and stated that she is scheduled for a colonoscopy on June the 25 th and patient is needing to go over her prep instruction and medication. Patient is requesting a call back. Please advise.

## 2023-07-05 NOTE — Telephone Encounter (Signed)
 Returned call to patient. She just needed to confirm time of arrival. All questions answered.

## 2023-07-08 ENCOUNTER — Ambulatory Visit (AMBULATORY_SURGERY_CENTER): Admitting: Gastroenterology

## 2023-07-08 ENCOUNTER — Encounter: Payer: Self-pay | Admitting: Gastroenterology

## 2023-07-08 VITALS — BP 119/72 | HR 86 | Temp 99.0°F | Resp 17 | Ht 66.0 in | Wt 281.8 lb

## 2023-07-08 DIAGNOSIS — K52832 Lymphocytic colitis: Secondary | ICD-10-CM

## 2023-07-08 DIAGNOSIS — K529 Noninfective gastroenteritis and colitis, unspecified: Secondary | ICD-10-CM

## 2023-07-08 DIAGNOSIS — K641 Second degree hemorrhoids: Secondary | ICD-10-CM

## 2023-07-08 MED ORDER — SODIUM CHLORIDE 0.9 % IV SOLN: 500.0000 mL | INTRAVENOUS | Status: DC

## 2023-07-08 NOTE — Progress Notes (Signed)
 GASTROENTEROLOGY PROCEDURE H&P NOTE   Primary Care Physician: Job Lukes, PA  HPI: Leslie Douglas is a 58 y.o. female who presents for Chronic colitis and history of lymphocytic colitis surveillance.  Past Medical History:  Diagnosis Date   Allergy     Anxiety    Arthritis    Back pain    Binge eating disorder    Borderline personality disorder (HCC)    COPD (chronic obstructive pulmonary disease) (HCC)    chronic bronchitis   Depression    Edema, lower extremity    Essential hypertension 04/05/2023   Hip pain    History of chicken pox    Hyperlipidemia 04/05/2023   Joint pain    Memory loss    Pancolitis (HCC)    Poor concentration    Seasonal allergies    Skin cancer, basal cell    basal cell   Tinnitus    Vitamin D  deficiency    Past Surgical History:  Procedure Laterality Date   CHOLECYSTECTOMY  2003   COLONOSCOPY     MOHS SURGERY  10/2017   SKIN SURGERY  05/2020   nose skin cancer   VAGINAL DELIVERY  1995   WISDOM TOOTH EXTRACTION  12/2017   Current Outpatient Medications  Medication Sig Dispense Refill   cariprazine  (VRAYLAR ) 3 MG capsule Take 1 capsule (3 mg total) by mouth daily. 90 capsule 1   losartan -hydrochlorothiazide (HYZAAR) 50-12.5 MG tablet TAKE 1 TABLET BY MOUTH DAILY 90 tablet 1   mesalamine  (LIALDA ) 1.2 g EC tablet TAKE FOUR TABLETS BY MOUTH DAILY WITH BREAKFAST 120 tablet 2   propranolol  (INDERAL ) 10 MG tablet Take 1 tablet (10 mg total) by mouth 3 (three) times daily as needed. For anxiety 90 tablet 1   sertraline  (ZOLOFT ) 100 MG tablet TAKE 1 TABLET BY MOUTH DAILY WITH BREAKFAST 90 tablet 1   albuterol  (VENTOLIN  HFA) 108 (90 Base) MCG/ACT inhaler Inhale 2 puffs into the lungs every 6 (six) hours as needed for wheezing or shortness of breath. 8 g 0   bismuth  subsalicylate (PEPTO BISMOL) 262 MG chewable tablet Chew 262 mg by mouth 2 (two) times daily.     diclofenac Sodium (VOLTAREN) 1 % GEL Apply 2 g topically as needed.      EPINEPHrine  (EPIPEN  2-PAK) 0.3 mg/0.3 mL IJ SOAJ injection Inject 0.3 mLs (0.3 mg total) into the muscle as needed for anaphylaxis. 1 each 1   fluticasone  (FLONASE ) 50 MCG/ACT nasal spray Place 2 sprays into both nostrils daily as needed for allergies or rhinitis. 16 g 5   hydrOXYzine  (VISTARIL ) 25 MG capsule Take 1-2 capsules (25-50 mg total) by mouth at bedtime as needed. For sleep 60 capsule 1   ipratropium (ATROVENT ) 0.03 % nasal spray Place 1 spray into both nostrils 4 (four) times daily. Use as directed. 30 mL 5   levocetirizine (XYZAL ) 5 MG tablet Take 1 tablet (5 mg total) by mouth every evening. 30 tablet 5   mupirocin  ointment (BACTROBAN ) 2 % Apply 1 Application topically 2 (two) times daily. 22 g 0   Semaglutide -Weight Management (WEGOVY ) 1 MG/0.5ML SOAJ Inject 1 mg into the skin once a week. 2 mL 2   Current Facility-Administered Medications  Medication Dose Route Frequency Provider Last Rate Last Admin   0.9 %  sodium chloride  infusion  500 mL Intravenous Continuous Mansouraty, Zyniah Ferraiolo Jr., MD        Current Outpatient Medications:    cariprazine  (VRAYLAR ) 3 MG capsule, Take 1 capsule (3 mg total)  by mouth daily., Disp: 90 capsule, Rfl: 1   losartan -hydrochlorothiazide (HYZAAR) 50-12.5 MG tablet, TAKE 1 TABLET BY MOUTH DAILY, Disp: 90 tablet, Rfl: 1   mesalamine  (LIALDA ) 1.2 g EC tablet, TAKE FOUR TABLETS BY MOUTH DAILY WITH BREAKFAST, Disp: 120 tablet, Rfl: 2   propranolol  (INDERAL ) 10 MG tablet, Take 1 tablet (10 mg total) by mouth 3 (three) times daily as needed. For anxiety, Disp: 90 tablet, Rfl: 1   sertraline  (ZOLOFT ) 100 MG tablet, TAKE 1 TABLET BY MOUTH DAILY WITH BREAKFAST, Disp: 90 tablet, Rfl: 1   albuterol  (VENTOLIN  HFA) 108 (90 Base) MCG/ACT inhaler, Inhale 2 puffs into the lungs every 6 (six) hours as needed for wheezing or shortness of breath., Disp: 8 g, Rfl: 0   bismuth  subsalicylate (PEPTO BISMOL) 262 MG chewable tablet, Chew 262 mg by mouth 2 (two) times daily.,  Disp: , Rfl:    diclofenac Sodium (VOLTAREN) 1 % GEL, Apply 2 g topically as needed., Disp: , Rfl:    EPINEPHrine  (EPIPEN  2-PAK) 0.3 mg/0.3 mL IJ SOAJ injection, Inject 0.3 mLs (0.3 mg total) into the muscle as needed for anaphylaxis., Disp: 1 each, Rfl: 1   fluticasone  (FLONASE ) 50 MCG/ACT nasal spray, Place 2 sprays into both nostrils daily as needed for allergies or rhinitis., Disp: 16 g, Rfl: 5   hydrOXYzine  (VISTARIL ) 25 MG capsule, Take 1-2 capsules (25-50 mg total) by mouth at bedtime as needed. For sleep, Disp: 60 capsule, Rfl: 1   ipratropium (ATROVENT ) 0.03 % nasal spray, Place 1 spray into both nostrils 4 (four) times daily. Use as directed., Disp: 30 mL, Rfl: 5   levocetirizine (XYZAL ) 5 MG tablet, Take 1 tablet (5 mg total) by mouth every evening., Disp: 30 tablet, Rfl: 5   mupirocin  ointment (BACTROBAN ) 2 %, Apply 1 Application topically 2 (two) times daily., Disp: 22 g, Rfl: 0   Semaglutide -Weight Management (WEGOVY ) 1 MG/0.5ML SOAJ, Inject 1 mg into the skin once a week., Disp: 2 mL, Rfl: 2  Current Facility-Administered Medications:    0.9 %  sodium chloride  infusion, 500 mL, Intravenous, Continuous, Mansouraty, Aloha Raddle., MD Allergies  Allergen Reactions   Doxycycline Rash and Swelling    Mouth and face   Erythromycin Shortness Of Breath   Sulfa Antibiotics Shortness Of Breath   Emsam [Selegiline] Dermatitis    Allergic reaction to patch   Molds & Smuts Rash   Penicillins Rash   Saxenda  [Liraglutide  -Weight Management] Nausea Only   Family History  Problem Relation Age of Onset   Hypertension Mother    Diabetes Father    Colon cancer Neg Hx    Esophageal cancer Neg Hx    Rectal cancer Neg Hx    Stomach cancer Neg Hx    Pancreatic cancer Neg Hx    Inflammatory bowel disease Neg Hx    Liver disease Neg Hx    Allergic rhinitis Neg Hx    Angioedema Neg Hx    Asthma Neg Hx    Eczema Neg Hx    Urticaria Neg Hx    Immunodeficiency Neg Hx    Atopy Neg Hx     Mental illness Neg Hx    Sleep apnea Neg Hx    Social History   Socioeconomic History   Marital status: Divorced    Spouse name: Not on file   Number of children: 3   Years of education: Not on file   Highest education level: Associate degree: academic program  Occupational History   Occupation: Midwife  Receivable    Comment: [employer]  Tobacco Use   Smoking status: Never   Smokeless tobacco: Never  Vaping Use   Vaping status: Never Used  Substance and Sexual Activity   Alcohol use: Not Currently    Comment: seldom (alcohol intolerance?)   Drug use: Never   Sexual activity: Not Currently    Birth control/protection: Abstinence  Other Topics Concern   Not on file  Social History Narrative   Works for [employer], has Associate Degree   3 children   Right handed    Social Drivers of Corporate investment banker Strain: Low Risk  (06/28/2023)   Overall Financial Resource Strain (CARDIA)    Difficulty of Paying Living Expenses: Not very hard  Food Insecurity: No Food Insecurity (06/28/2023)   Hunger Vital Sign    Worried About Running Out of Food in the Last Year: Never true    Ran Out of Food in the Last Year: Never true  Transportation Needs: No Transportation Needs (06/28/2023)   PRAPARE - Administrator, Civil Service (Medical): No    Lack of Transportation (Non-Medical): No  Physical Activity: Insufficiently Active (06/28/2023)   Exercise Vital Sign    Days of Exercise per Week: 2 days    Minutes of Exercise per Session: 10 min  Stress: No Stress Concern Present (06/28/2023)   [employer] of Occupational Health - Occupational Stress Questionnaire    Feeling of Stress: Only a little  Social Connections: Moderately Isolated (06/28/2023)   Social Connection and Isolation Panel    Frequency of Communication with Friends and Family: Three times a week    Frequency of Social Gatherings with Friends and Family: Once a week    Attends Religious  Services: 1 to 4 times per year    Active Member of [employer] or Organizations: No    Attends Engineer, structural: Not on file    Marital Status: Divorced  Intimate Partner Violence: Not on file    Physical Exam: Today's Vitals   07/08/23 1421  BP: 136/89  Pulse: (!) 108  Temp: 99 F (37.2 C)  TempSrc: Skin  SpO2: 95%  Weight: 281 lb 12.8 oz (127.8 kg)  Height: 5' 6 (1.676 m)   Body mass index is 45.48 kg/m. GEN: NAD EYE: Sclerae anicteric ENT: MMM CV: Non-tachycardic GI: Soft, NT/ND NEURO:  Alert & Oriented x 3  Lab Results: No results for input(s): WBC, HGB, HCT, PLT in the last 72 hours. BMET No results for input(s): NA, K, CL, CO2, GLUCOSE, BUN, CREATININE, CALCIUM in the last 72 hours. LFT No results for input(s): PROT, ALBUMIN, AST, ALT, ALKPHOS, BILITOT, BILIDIR, IBILI in the last 72 hours. PT/INR No results for input(s): LABPROT, INR in the last 72 hours.   Impression / Plan: This is a 58 y.o.female who presents for Chronic colitis and history of lymphocytic colitis surveillance.  The risks and benefits of endoscopic evaluation/treatment were discussed with the patient and/or family; these include but are not limited to the risk of perforation, infection, bleeding, missed lesions, lack of diagnosis, severe illness requiring hospitalization, as well as anesthesia and sedation related illnesses.  The patient's history has been reviewed, patient examined, no change in status, and deemed stable for procedure.  The patient and/or family is agreeable to proceed.    Aloha Finner, MD [facility] Office # [phone]

## 2023-07-08 NOTE — Progress Notes (Signed)
 Pt A/O x 3, gd SR's, pleased with anesthesia, report to RN

## 2023-07-08 NOTE — Progress Notes (Signed)
 Patient states there have been no changes to medical or surgical history since time of pre-visit.

## 2023-07-08 NOTE — Progress Notes (Signed)
 Called to room to assist during endoscopic procedure.  Patient ID and intended procedure confirmed with present staff. Received instructions for my participation in the procedure from the performing physician.

## 2023-07-08 NOTE — Patient Instructions (Signed)
 Handouts on hemorrhoids given to you today.   Await pathology results on biopsies done   Continue previous diet & medications  High fiber diet ( handout given to you today)    YOU HAD AN ENDOSCOPIC PROCEDURE TODAY AT THE [facility]:   Refer to the procedure report that was given to you for any specific questions about what was found during the examination.  If the procedure report does not answer your questions, please call your gastroenterologist to clarify.  If you requested that your care partner not be given the details of your procedure findings, then the procedure report has been included in a sealed envelope for you to review at your convenience later.  YOU SHOULD EXPECT: Some feelings of bloating in the abdomen. Passage of more gas than usual.  Walking can help get rid of the air that was put into your GI tract during the procedure and reduce the bloating. If you had a lower endoscopy (such as a colonoscopy or flexible sigmoidoscopy) you may notice spotting of blood in your stool or on the toilet paper. If you underwent a bowel prep for your procedure, you may not have a normal bowel movement for a few days.  Please Note:  You might notice some irritation and congestion in your nose or some drainage.  This is from the oxygen used during your procedure.  There is no need for concern and it should clear up in a day or so.  SYMPTOMS TO REPORT IMMEDIATELY:  Following lower endoscopy (colonoscopy or flexible sigmoidoscopy):  Excessive amounts of blood in the stool  Significant tenderness or worsening of abdominal pains  Swelling of the abdomen that is new, acute  Fever of 100F or higher  Following upper endoscopy (EGD)  Vomiting of blood or coffee ground material  New chest pain or pain under the shoulder blades  Painful or persistently difficult swallowing  New shortness of breath  Fever of 100F or higher  Black, tarry-looking stools  For urgent or emergent  issues, a gastroenterologist can be reached at any hour by calling [phone]. Do not use MyChart messaging for urgent concerns.    DIET:  We do recommend a small meal at first, but then you may proceed to your regular diet.  Drink plenty of fluids but you should avoid alcoholic beverages for 24 hours.  ACTIVITY:  You should plan to take it easy for the rest of today and you should NOT DRIVE or use heavy machinery until tomorrow (because of the sedation medicines used during the test).    FOLLOW UP: Our staff will call the number listed on your records the next business day following your procedure.  We will call around 7:15- 8:00 am to check on you and address any questions or concerns that you may have regarding the information given to you following your procedure. If we do not reach you, we will leave a message.     If any biopsies were taken you will be contacted by phone or by letter within the next 1-3 weeks.  Please call us  at [phone] if you have not heard about the biopsies in 3 weeks.    SIGNATURES/CONFIDENTIALITY: You and/or your care partner have signed paperwork which will be entered into your electronic medical record.  These signatures attest to the fact that that the information above on your After Visit Summary has been reviewed and is understood.  Full responsibility of the confidentiality of this discharge information lies  with you and/or your care-partner.

## 2023-07-08 NOTE — Op Note (Signed)
 [facility] Patient Name: Leslie Douglas Procedure Date: 07/08/2023 2:51 PM MRN: [id number] Endoscopist: Aloha Finner , MD, [id number] Age: 58 Referring MD:  Date of Birth: 1965-10-02 Gender: Female Account #: [id number] Procedure:                Colonoscopy Indications:              History of chronic colitis and subsequently                            lymphocytic colitis here for surveillance Medicines:                Monitored Anesthesia Care Procedure:                Pre-Anesthesia Assessment:                           - Prior to the procedure, a History and Physical                            was performed, and patient medications and                            allergies were reviewed. The patient's tolerance of                            previous anesthesia was also reviewed. The risks                            and benefits of the procedure and the sedation                            options and risks were discussed with the patient.                            All questions were answered, and informed consent                            was obtained. Prior Anticoagulants: The patient has                            taken no anticoagulant or antiplatelet agents. ASA                            Grade Assessment: III - A patient with severe                            systemic disease. After reviewing the risks and                            benefits, the patient was deemed in satisfactory                            condition to undergo the procedure.  After obtaining informed consent, the colonoscope                            was passed under direct vision. Throughout the                            procedure, the patient's blood pressure, pulse, and                            oxygen saturations were monitored continuously. The                            CF [id number] #[id number] was introduced through the anus                            and advanced  to the 3 cm into the ileum. The                            colonoscopy was performed without difficulty. The                            patient tolerated the procedure. The quality of the                            bowel preparation was good. The terminal ileum,                            ileocecal valve, appendiceal orifice, and rectum                            were photographed. Scope In: 3:07:05 PM Scope Out: 3:18:06 PM Scope Withdrawal Time: 0 hours 8 minutes 22 seconds  Total Procedure Duration: 0 hours 11 minutes 1 second  Findings:                 The digital rectal exam findings include                            hemorrhoids. Pertinent negatives include no                            palpable rectal lesions.                           The terminal ileum and ileocecal valve appeared                            normal.                           Normal mucosa was found in the entire colon. Due to                            her history of previous chronic colitis and also  lymphocytic colitis surveillance biopsies were                            taken. Biopsies were taken with a cold forceps for                            histology from the right colon. Biopsies were taken                            with a cold forceps for histology from the left                            colon. Biopsies were taken with a cold forceps for                            histology from the rectum.                           Non-bleeding non-thrombosed internal hemorrhoids                            were found during retroflexion, during perianal                            exam and during digital exam. The hemorrhoids were                            Grade II (internal hemorrhoids that prolapse but                            reduce spontaneously). Complications:            No immediate complications. Estimated Blood Loss:     Estimated blood loss was minimal. Impression:                - Hemorrhoids found on digital rectal exam.                           - The examined portion of the ileum was normal.                           - Normal mucosa in the entire examined colon.                            Surveillance biopsies obtained.                           - Non-bleeding non-thrombosed internal hemorrhoids. Recommendation:           - The patient will be observed post-procedure,                            until all discharge criteria are met.                           - Discharge patient  to home.                           - Patient has a contact number available for                            emergencies. The signs and symptoms of potential                            delayed complications were discussed with the                            patient. Return to normal activities tomorrow.                            Written discharge instructions were provided to the                            patient.                           - High fiber diet.                           - Continue present medications.                           - Await pathology results.                           - Repeat colonoscopy in 2 years for surveillance                            based on pathology results.                           - At some point would be ideal for patient to bring                            in fecal calprotectin to see how the biomarker goes                            based on also what is found at time of colonoscopy                            and biopsies. Recommend bringing in over the next                            few weeks if possible.                           - The findings and recommendations were discussed                            with the patient.                           -  The findings and recommendations were discussed                            with the patient's family. Aloha Finner, MD 07/08/2023 3:27:08 PM

## 2023-07-11 ENCOUNTER — Telehealth: Payer: Self-pay

## 2023-07-11 NOTE — Telephone Encounter (Signed)
  Follow up Call-     07/08/2023    2:24 PM 04/29/2021    9:12 AM  Call back number  Post procedure Call Back phone  # [phone]  Permission to leave phone message Yes Yes     Patient questions:  Do you have a fever, pain , or abdominal swelling? No. Pain Score  0 *  Have you tolerated food without any problems? Yes.    Have you been able to return to your normal activities? Yes.    Do you have any questions about your discharge instructions: Diet   No. Medications  No. Follow up visit  No.  Do you have questions or concerns about your Care? No.  Actions: * If pain score is 4 or above: No action needed, pain <4.

## 2023-07-14 LAB — SURGICAL PATHOLOGY

## 2023-07-21 ENCOUNTER — Ambulatory Visit: Payer: Self-pay | Admitting: Gastroenterology

## 2023-07-26 ENCOUNTER — Encounter: Payer: Self-pay | Admitting: Cardiovascular Disease

## 2023-07-26 ENCOUNTER — Ambulatory Visit: Attending: Cardiology | Admitting: Cardiovascular Disease

## 2023-07-26 VITALS — BP 134/88 | HR 71 | Ht 66.75 in | Wt 285.6 lb

## 2023-07-26 DIAGNOSIS — G4733 Obstructive sleep apnea (adult) (pediatric): Secondary | ICD-10-CM

## 2023-07-26 DIAGNOSIS — E782 Mixed hyperlipidemia: Secondary | ICD-10-CM

## 2023-07-26 DIAGNOSIS — I10 Essential (primary) hypertension: Secondary | ICD-10-CM

## 2023-07-26 NOTE — Progress Notes (Unsigned)
 Ms. Villeda returns today for follow-up of her outpatient diagnostic test done in the evaluation of dyspnea.  Her 2D echo was essentially normal except for grade 1 diastolic dysfunction.  Her coronary calcium score was 0.  She did contract COVID for the third time in May of this year.  She is lost 30 pounds on Wegovy  and her dyspnea has significantly improved.  Her weight goal is 150 pounds so she has a long way to go.  Her fasting lipid profile performed 11/30/2022 revealed total cholesterol 260, LDL 158 and HDL 51.  We will readjust this when she reaches her ideal body weight.  She may ultimately require being on statin therapy for LDL less than 100.  I will see her back in 12 months for follow-up.  Dorn DOROTHA Lesches, M.D., FACP, [facility], FAHA, [facility]  [address] [place], [place]  [address]  [phone] 07/26/2023 4:09 PM

## 2023-07-26 NOTE — Patient Instructions (Signed)

## 2023-07-28 ENCOUNTER — Telehealth: Payer: Self-pay | Admitting: Neurology

## 2023-07-28 NOTE — Telephone Encounter (Signed)
 Reschedule appointment due to work schedule conflict. Will be working in [employer] instead of [address]

## 2023-08-01 ENCOUNTER — Encounter: Payer: Self-pay | Admitting: Physician Assistant

## 2023-08-01 NOTE — Telephone Encounter (Signed)
 Please see message and advise

## 2023-08-02 ENCOUNTER — Ambulatory Visit: Payer: No Typology Code available for payment source | Admitting: Adult Health

## 2023-08-02 MED ORDER — WEGOVY 0.25 MG/0.5ML ~~LOC~~ SOAJ: 0.2500 mg | SUBCUTANEOUS | 0 refills | Status: DC

## 2023-08-10 ENCOUNTER — Encounter: Payer: Self-pay | Admitting: Psychiatry

## 2023-08-10 ENCOUNTER — Other Ambulatory Visit: Payer: Self-pay

## 2023-08-10 ENCOUNTER — Ambulatory Visit (INDEPENDENT_AMBULATORY_CARE_PROVIDER_SITE_OTHER): Admitting: Psychiatry

## 2023-08-10 VITALS — BP 106/75 | HR 79 | Temp 96.0°F | Ht 66.75 in | Wt 282.6 lb

## 2023-08-10 DIAGNOSIS — F3342 Major depressive disorder, recurrent, in full remission: Secondary | ICD-10-CM

## 2023-08-10 DIAGNOSIS — F603 Borderline personality disorder: Secondary | ICD-10-CM

## 2023-08-10 DIAGNOSIS — F5081 Binge eating disorder, mild: Secondary | ICD-10-CM | POA: Diagnosis not present

## 2023-08-10 DIAGNOSIS — F411 Generalized anxiety disorder: Secondary | ICD-10-CM | POA: Diagnosis not present

## 2023-08-10 DIAGNOSIS — G4733 Obstructive sleep apnea (adult) (pediatric): Secondary | ICD-10-CM

## 2023-08-10 NOTE — Progress Notes (Signed)
 [facility] MD OP Progress Note  08/10/2023 9:04 AM Leslie Douglas  MRN:  [id number]  Chief Complaint:  Chief Complaint  Patient presents with   Follow-up   Depression   Anxiety   Eating Disorder   Medication Refill   Discussed the use of AI scribe software for clinical note transcription with the patient, who gave verbal consent to proceed.  History of Present Illness Leslie Douglas is a 58 year old Caucasian female, employed, separated, lives in [place], has a history of MDD, GAD, binge eating disorder, borderline personality disorder was evaluated in office today for a follow-up appointment.  She reports that her depression remains in remission and continues to feel well since her last visit. She describes feeling good. For mood management, she currently takes Vraylar  3 mg daily and sertraline  100 mg daily. Over the past two weeks, she notes only a slight lack of motivation to do things but denies significant sadness, hopelessness, or feeling down. She denies any recent suicidal ideation, intent, or plans, and also denies any recent self-injurious thoughts or behaviors.  Regarding anxiety, she states that her generalized anxiety remains stable. Her current regimen includes sertraline  100 mg daily, propranolol  10 mg three times a day as needed, and hydroxyzine  25-50 mg at bedtime as needed. Over the past two weeks, she denies feeling anxious, on edge, or having trouble controlling worry. She also denies trouble relaxing, restlessness, irritability, or feeling afraid that something awful might happen.  Wegovy  has helped reduce food noise, and she is not constantly thinking about food. She continues cognitive behavioral therapy (CBT) for binge eating disorder. Since starting Wegovy , she notes a decrease in appetite and food intake. She reports a total weight loss of 30 pounds since March and states that this has made it easier to exercise and make better dietary choices. She exercises at least  three times a week, including weights, crunches, leg and arm work, and treadmill use.  She reports restful sleep with the use of melatonin gummies. She is not currently using her CPAP for obstructive sleep apnea due to issues with the mask seal.   She currently works from home. She has a son who recently moved to [place]  for employment.    Visit Diagnosis:    ICD-10-CM   1. Recurrent major depressive disorder, in full remission (HCC)  F33.42     2. GAD (generalized anxiety disorder)  F41.1     3. Mild binge-eating disorder  F50.810     4. Borderline personality disorder (HCC)  F60.3     5. OSA (obstructive sleep apnea)  G47.33       Past Psychiatric History: I have reviewed past psychiatric history from progress note on 05/19/2021.  Past Medical History:  Past Medical History:  Diagnosis Date   Allergy     Anxiety    Arthritis    Back pain    Binge eating disorder    Borderline personality disorder (HCC)    COPD (chronic obstructive pulmonary disease) (HCC)    chronic bronchitis   Depression    Edema, lower extremity    Essential hypertension 04/05/2023   Hip pain    History of chicken pox    Hyperlipidemia 04/05/2023   Joint pain    Memory loss    Pancolitis (HCC)    Poor concentration    Seasonal allergies    Skin cancer, basal cell    basal cell   Tinnitus    Vitamin D  deficiency     Past Surgical  History:  Procedure Laterality Date   CHOLECYSTECTOMY  2003   COLONOSCOPY     MOHS SURGERY  10/2017   SKIN SURGERY  05/2020   nose skin cancer   VAGINAL DELIVERY  1995   WISDOM TOOTH EXTRACTION  12/2017    Family Psychiatric History: I have reviewed family psychiatric history from progress note on 05/19/2021.  Family History:  Family History  Problem Relation Age of Onset   Hypertension Mother    Diabetes Father    Colon cancer Neg Hx    Esophageal cancer Neg Hx    Rectal cancer Neg Hx    Stomach cancer Neg Hx    Pancreatic cancer Neg Hx     Inflammatory bowel disease Neg Hx    Liver disease Neg Hx    Allergic rhinitis Neg Hx    Angioedema Neg Hx    Asthma Neg Hx    Eczema Neg Hx    Urticaria Neg Hx    Immunodeficiency Neg Hx    Atopy Neg Hx    Mental illness Neg Hx    Sleep apnea Neg Hx     Social History: I have reviewed social history from progress note on 05/19/2021. Social History   Socioeconomic History   Marital status: Divorced    Spouse name: Not on file   Number of children: 3   Years of education: Not on file   Highest education level: Associate degree: academic program  Occupational History   Occupation: Aeronautical engineer    Comment: [employer]  Tobacco Use   Smoking status: Never   Smokeless tobacco: Never  Vaping Use   Vaping status: Never Used  Substance and Sexual Activity   Alcohol use: Not Currently    Comment: seldom (alcohol intolerance?)   Drug use: Never   Sexual activity: Not Currently    Birth control/protection: Abstinence  Other Topics Concern   Not on file  Social History Narrative   Works for [employer], has Associate Degree   3 children   Right handed    Social Drivers of Corporate investment banker Strain: Low Risk  (06/28/2023)   Overall Financial Resource Strain (CARDIA)    Difficulty of Paying Living Expenses: Not very hard  Food Insecurity: No Food Insecurity (06/28/2023)   Hunger Vital Sign    Worried About Running Out of Food in the Last Year: Never true    Ran Out of Food in the Last Year: Never true  Transportation Needs: No Transportation Needs (06/28/2023)   PRAPARE - Administrator, Civil Service (Medical): No    Lack of Transportation (Non-Medical): No  Physical Activity: Insufficiently Active (06/28/2023)   Exercise Vital Sign    Days of Exercise per Week: 2 days    Minutes of Exercise per Session: 10 min  Stress: No Stress Concern Present (06/28/2023)   [employer] of Occupational Health - Occupational Stress Questionnaire     Feeling of Stress: Only a little  Social Connections: Moderately Isolated (06/28/2023)   Social Connection and Isolation Panel    Frequency of Communication with Friends and Family: Three times a week    Frequency of Social Gatherings with Friends and Family: Once a week    Attends Religious Services: 1 to 4 times per year    Active Member of [employer] or Organizations: No    Attends Engineer, structural: Not on file    Marital Status: Divorced    Allergies:  Allergies  Allergen Reactions  Doxycycline Rash and Swelling    Mouth and face   Erythromycin Shortness Of Breath   Sulfa Antibiotics Shortness Of Breath   Emsam [Selegiline] Dermatitis    Allergic reaction to patch   Molds & Smuts Rash   Penicillins Rash   Saxenda  [Liraglutide  -Weight Management] Nausea Only    Metabolic Disorder Labs: Lab Results  Component Value Date   HGBA1C 5.4 05/27/2021   Lab Results  Component Value Date   PROLACTIN 3.6 (L) 05/27/2021   Lab Results  Component Value Date   CHOL 260 (H) 11/30/2022   TRIG 250.0 (H) 11/30/2022   HDL 51.90 11/30/2022   CHOLHDL 5 11/30/2022   VLDL 50.0 (H) 11/30/2022   LDLCALC 158 (H) 11/30/2022   LDLCALC 140 (H) 03/31/2021   Lab Results  Component Value Date   TSH 2.000 05/27/2021   TSH 2.32 03/27/2019    Therapeutic Level Labs: No results found for: LITHIUM No results found for: VALPROATE No results found for: CBMZ  Current Medications: Current Outpatient Medications  Medication Sig Dispense Refill   albuterol  (VENTOLIN  HFA) 108 (90 Base) MCG/ACT inhaler Inhale 2 puffs into the lungs every 6 (six) hours as needed for wheezing or shortness of breath. (Patient not taking: Reported on 07/26/2023) 8 g 0   bismuth  subsalicylate (PEPTO BISMOL) 262 MG chewable tablet Chew 262 mg by mouth 2 (two) times daily.     cariprazine  (VRAYLAR ) 3 MG capsule Take 1 capsule (3 mg total) by mouth daily. 90 capsule 1   diclofenac Sodium (VOLTAREN) 1 % GEL  Apply 2 g topically as needed.     EPINEPHrine  (EPIPEN  2-PAK) 0.3 mg/0.3 mL IJ SOAJ injection Inject 0.3 mLs (0.3 mg total) into the muscle as needed for anaphylaxis. 1 each 1   fluticasone  (FLONASE ) 50 MCG/ACT nasal spray Place 2 sprays into both nostrils daily as needed for allergies or rhinitis. (Patient not taking: Reported on 07/26/2023) 16 g 5   hydrOXYzine  (VISTARIL ) 25 MG capsule Take 1-2 capsules (25-50 mg total) by mouth at bedtime as needed. For sleep (Patient not taking: Reported on 07/26/2023) 60 capsule 1   ipratropium (ATROVENT ) 0.03 % nasal spray Place 1 spray into both nostrils 4 (four) times daily. Use as directed. 30 mL 5   levocetirizine (XYZAL ) 5 MG tablet Take 1 tablet (5 mg total) by mouth every evening. 30 tablet 5   losartan -hydrochlorothiazide (HYZAAR) 50-12.5 MG tablet TAKE 1 TABLET BY MOUTH DAILY 90 tablet 1   mesalamine  (LIALDA ) 1.2 g EC tablet TAKE FOUR TABLETS BY MOUTH DAILY WITH BREAKFAST 120 tablet 2   mupirocin  ointment (BACTROBAN ) 2 % Apply 1 Application topically 2 (two) times daily. 22 g 0   propranolol  (INDERAL ) 10 MG tablet Take 1 tablet (10 mg total) by mouth 3 (three) times daily as needed. For anxiety (Patient not taking: Reported on 07/26/2023) 90 tablet 1   Semaglutide -Weight Management (WEGOVY ) 0.25 MG/0.5ML SOAJ Inject 0.25 mg into the skin once a week. 2 mL 0   sertraline  (ZOLOFT ) 100 MG tablet TAKE 1 TABLET BY MOUTH DAILY WITH BREAKFAST 90 tablet 1   No current facility-administered medications for this visit.     Musculoskeletal: Strength & Muscle Tone: within normal limits Gait & Station: normal Patient leans: N/A  Psychiatric Specialty Exam: Review of Systems  Psychiatric/Behavioral: Negative.      Blood pressure 106/75, pulse 79, temperature (!) 96 F (35.6 C), temperature source Temporal, height 5' 6.75 (1.695 m), weight 282 lb 9.6 oz (128.2 kg).Body mass  index is 44.59 kg/m.  General Appearance: Fairly Groomed  Eye Contact:  Good   Speech:  Clear and Coherent  Volume:  Normal  Mood:  Euthymic  Affect:  Congruent  Thought Process:  Goal Directed and Descriptions of Associations: Intact  Orientation:  Full (Time, Place, and Person)  Thought Content: Logical   Suicidal Thoughts:  No  Homicidal Thoughts:  No  Memory:  Immediate;   Fair Recent;   Fair Remote;   Fair  Judgement:  Fair  Insight:  Fair  Psychomotor Activity:  Normal  Concentration:  Concentration: Fair and Attention Span: Fair  Recall:  [employer] of Knowledge: Fair  Language: Fair  Akathisia:  No  Handed:  Right  AIMS (if indicated): done  Assets:  Communication Skills Desire for Improvement Housing Social Support Transportation  ADL's:  Intact  Cognition: WNL  Sleep:  Fair   Screenings: Midwife Visit from 08/10/2023 in [facility] Office Visit from 09/02/2022 in [facility] Office Visit from 06/01/2022 in [facility] Office Visit from 03/04/2022 in [facility] Office Visit from 11/06/2021 in [facility]  AIMS Total Score 0 0 0 0 0   GAD-7    Flowsheet Row Office Visit from 08/10/2023 in [facility] Office Visit from 06/28/2023 in [facility] [employer] from 02/02/2023 in [facility] [employer] from 11/30/2022 in [facility] [employer] from 09/02/2022 in [facility]  Total GAD-7 Score 0 5 5 5 4    PHQ2-9    Flowsheet Row Office Visit from 08/10/2023 in [facility] Office Visit from 06/28/2023 in [facility] [place] Office Visit from 02/02/2023 in [facility] [employer] Visit from 11/30/2022 in [facility] [employer] Visit from 09/02/2022 in [facility]  PHQ-2 Total Score 1 2 4 4 2   PHQ-9 Total Score -- 7 13 11 7    Flowsheet Row Office Visit from 08/10/2023 in [facility] Video Visit from 05/13/2023 in [facility] Video Visit from 02/17/2023 in [facility]  C-SSRS RISK CATEGORY Moderate Risk No Risk No Risk     Assessment and Plan: Leslie Douglas is a 58 year old Caucasian female who has a history of MDD, binge eating disorder, obstructive sleep apnea, borderline personality disorder was evaluated in office today.  Discussed assessment and plan as noted below.  Depression in remission Depression currently well managed on the current combination of medication. Continue Vraylar  3 mg daily.  Plan to reduce the dosage in the future if depression remains stable. Continue Sertraline  100 mg daily Continue CBT with therapist at [facility] counseling.  Generalized anxiety disorder-stable Anxiety currently well managed. Continue Sertraline  as prescribed Continue Propranolol  10 mg 3 times a day as needed Continue Hydroxyzine  25 to 50 mg at bedtime as needed  Borderline personality disorder-improving Borderline personality disorder well managed with therapy sessions. Continue CBT/DBT  Binge eating disorder-stable Currently on Wegovy  with a 30 pound weight loss since March 2025.  Also managing diet and exercise. Continue CBT and monitor for any eating  disorder symptoms.  Sleep apnea-improving Does have trouble with CPAP mask and hence has been noncompliant with it.  Using melatonin and L-theanine gummies.  Reports sleep is good. Encouraged to discuss CPAP problem with provider. Continue sleep hygiene  techniques.  Follow-up Follow-up in clinic in 3 months or sooner if needed.   Collaboration of Care: Collaboration of Care: Referral or follow-up with counselor/therapist AEB encouraged to follow up with therapist  Patient/Guardian was advised Release of Information must be obtained prior to any record release in order to collaborate their care with an outside provider. Patient/Guardian was advised if they have not already done so to contact the registration department to sign all necessary forms in order for us  to release information regarding their care.   Consent: Patient/Guardian gives verbal consent for treatment and assignment of benefits for services provided during this visit. Patient/Guardian expressed understanding and agreed to proceed.   This note was generated in part or whole with voice recognition software. Voice recognition is usually quite accurate but there are transcription errors that can and very often do occur. I apologize for any typographical errors that were not detected and corrected.    Sherilee Smotherman, MD 08/10/2023, 9:04 AM

## 2023-08-25 ENCOUNTER — Other Ambulatory Visit (HOSPITAL_COMMUNITY): Payer: Self-pay

## 2023-08-26 ENCOUNTER — Other Ambulatory Visit (HOSPITAL_COMMUNITY): Payer: Self-pay

## 2023-08-26 ENCOUNTER — Telehealth: Payer: Self-pay

## 2023-08-26 ENCOUNTER — Other Ambulatory Visit: Payer: Self-pay | Admitting: Physician Assistant

## 2023-08-26 NOTE — Telephone Encounter (Signed)
 Pharmacy Patient Advocate Encounter   Received notification from Onbase that prior authorization for Wegovy  0.25 is required/requested.   Insurance verification completed.   The patient is insured through [employer]).   Per test claim: Refill too soon. PA is not needed at this time. Medication was filled 08/26/23. Next eligible fill date is 09/17/23.   Current PA expires 09/23/23.  Patient must enroll in [facility] weight program [phone]  PA renewal submitted 08/25/23

## 2023-08-30 NOTE — Telephone Encounter (Signed)
 Spoke to pt told her PA Team tried to run PA but was told,  Per test claim: Refill too soon. PA is not needed at this time. Medication was filled 08/26/23. Next eligible fill date is 09/17/23.    Current PA expires 09/23/23.  Patient must enroll in [facility] weight program [phone].  Pt verbalized understanding and said she is already enrolled. Told her okay we will do PA when it expires on 09/23/2023. Pt verbalized understanding.

## 2023-09-23 ENCOUNTER — Telehealth: Payer: Self-pay

## 2023-09-23 ENCOUNTER — Telehealth: Payer: Self-pay | Admitting: *Deleted

## 2023-09-23 ENCOUNTER — Other Ambulatory Visit (HOSPITAL_COMMUNITY): Payer: Self-pay

## 2023-09-23 NOTE — Telephone Encounter (Signed)
 Please do PA for Wegovy  expired today 09/23/2023.

## 2023-09-23 NOTE — Telephone Encounter (Signed)
 Pharmacy Patient Advocate Encounter  Received notification from [facility] that Prior Authorization for Wegovy  0.25MG /0.5ML auto-injectors  has been APPROVED from 09/23/23 to 09/22/24   PA #/Case ID/Reference #: [id number]

## 2023-09-23 NOTE — Telephone Encounter (Signed)
 Pharmacy Patient Advocate Encounter   Received notification from Pt Calls Messages that prior authorization for Wegovy  0.25MG /0.5ML auto-injectors is required/requested.   Insurance verification completed.   The patient is insured through [facility] .   Per test claim: PA required; PA submitted to above mentioned insurance via Latent Key/confirmation #/EOC [id number] Status is pending

## 2023-10-06 ENCOUNTER — Other Ambulatory Visit: Payer: Self-pay | Admitting: Psychiatry

## 2023-10-06 ENCOUNTER — Other Ambulatory Visit: Payer: Self-pay | Admitting: Physician Assistant

## 2023-10-06 DIAGNOSIS — F411 Generalized anxiety disorder: Secondary | ICD-10-CM

## 2023-10-06 DIAGNOSIS — F3342 Major depressive disorder, recurrent, in full remission: Secondary | ICD-10-CM

## 2023-10-07 MED ORDER — WEGOVY 0.5 MG/0.5ML ~~LOC~~ SOAJ: 0.5000 mg | SUBCUTANEOUS | 0 refills | Status: DC

## 2023-10-17 ENCOUNTER — Encounter: Payer: No Typology Code available for payment source | Admitting: Physician Assistant

## 2023-11-08 ENCOUNTER — Other Ambulatory Visit: Payer: Self-pay | Admitting: Physician Assistant

## 2023-11-09 ENCOUNTER — Encounter: Payer: Self-pay | Admitting: Psychiatry

## 2023-11-09 ENCOUNTER — Ambulatory Visit (INDEPENDENT_AMBULATORY_CARE_PROVIDER_SITE_OTHER): Admitting: Psychiatry

## 2023-11-09 ENCOUNTER — Other Ambulatory Visit: Payer: Self-pay

## 2023-11-09 VITALS — BP 129/82 | HR 70 | Temp 97.1°F | Ht 66.75 in | Wt 281.8 lb

## 2023-11-09 DIAGNOSIS — F5081 Binge eating disorder, mild: Secondary | ICD-10-CM

## 2023-11-09 DIAGNOSIS — F411 Generalized anxiety disorder: Secondary | ICD-10-CM

## 2023-11-09 DIAGNOSIS — F3342 Major depressive disorder, recurrent, in full remission: Secondary | ICD-10-CM | POA: Diagnosis not present

## 2023-11-09 DIAGNOSIS — G4733 Obstructive sleep apnea (adult) (pediatric): Secondary | ICD-10-CM

## 2023-11-09 DIAGNOSIS — F603 Borderline personality disorder: Secondary | ICD-10-CM

## 2023-11-09 MED ORDER — CARIPRAZINE HCL 3 MG PO CAPS: 3.0000 mg | ORAL_CAPSULE | Freq: Every day | ORAL | 1 refills | Status: DC

## 2023-11-09 NOTE — Progress Notes (Signed)
 BH MD OP Progress Note  11/09/2023 8:57 AM Leslie Douglas  MRN:  [id number]  Chief Complaint:  Chief Complaint  Patient presents with   Follow-up   Anxiety   Depression   Medication Refill   Discussed the use of AI scribe software for clinical note transcription with the patient, who gave verbal consent to proceed.  History of Present Illness Leslie Douglas is a 58 year old Caucasian female, employed, separated, lives in [place], has a history of MDD, GAD, binge eating disorder, borderline personality disorder was evaluated in office today for a follow-up appointment.  Overall, she reports doing well and feels she is in a good place. She denies experiencing significant sadness or anxiety symptoms and states that everything seems to be working as it should. 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  Her current medications include Vraylar  3 mg and sertraline  100 mg daily, as well as propranolol  and hydroxyzine  as needed. She notes that she uses propranolol  only occasionally, typically in social situations when she feels anxiety, but most of the time she does not need it.  Regarding sleep, she describes sleeping well, waking up feeling rested, and waking once to adjust her position. She reports not using her CPAP machine because it keeps her awake due to issues with the mask seal, but she has an upcoming neurology appointment to address this. She states that she is working on weight loss and has lost 35 pounds, though she is currently at a plateau.  She currently takes Wegovy .  She denies any suicidality, homicidality or perceptual disturbances.   Visit Diagnosis:    ICD-10-CM   1. Recurrent major depressive disorder, in full remission  F33.42 cariprazine  (VRAYLAR ) 3 MG capsule    2. GAD (generalized anxiety disorder)  F41.1     3. Mild binge-eating disorder  F50.810     4. Borderline personality  disorder (HCC)  F60.3     5. OSA (obstructive sleep apnea)  G47.33       Past Psychiatric History: I have reviewed past psychiatric history from progress note on 05/19/2021.  Past Medical History:  Past Medical History:  Diagnosis Date   Allergy     Anxiety    Arthritis    Back pain    Binge eating disorder    Borderline personality disorder (HCC)    COPD (chronic obstructive pulmonary disease) (HCC)    chronic bronchitis   Depression    Edema, lower extremity    Essential hypertension 04/05/2023   Hip pain    History of chicken pox    Hyperlipidemia 04/05/2023   Joint pain    Memory loss    Pancolitis    Poor concentration    Seasonal allergies    Skin cancer, basal cell    basal cell   Tinnitus    Vitamin D  deficiency     Past Surgical History:  Procedure Laterality Date   CHOLECYSTECTOMY  2003   COLONOSCOPY     MOHS SURGERY  10/2017   SKIN SURGERY  05/2020   nose skin cancer   VAGINAL DELIVERY  1995   WISDOM TOOTH EXTRACTION  12/2017    Family Psychiatric History: Reviewed family psychiatric history from progress note on 05/19/2021.  Family History:  Family History  Problem Relation Age of Onset   Hypertension Mother    Diabetes Father    Colon cancer Neg Hx    Esophageal cancer Neg  Hx    Rectal cancer Neg Hx    Stomach cancer Neg Hx    Pancreatic cancer Neg Hx    Inflammatory bowel disease Neg Hx    Liver disease Neg Hx    Allergic rhinitis Neg Hx    Angioedema Neg Hx    Asthma Neg Hx    Eczema Neg Hx    Urticaria Neg Hx    Immunodeficiency Neg Hx    Atopy Neg Hx    Mental illness Neg Hx    Sleep apnea Neg Hx     Social History: Reviewed social history from progress note on 05/19/2021. Social History   Socioeconomic History   Marital status: Divorced    Spouse name: Not on file   Number of children: 3   Years of education: Not on file   Highest education level: Associate degree: academic program  Occupational History   Occupation: Best Boy    Comment: [employer]  Tobacco Use   Smoking status: Never   Smokeless tobacco: Never  Vaping Use   Vaping status: Never Used  Substance and Sexual Activity   Alcohol use: Not Currently    Comment: seldom (alcohol intolerance?)   Drug use: Never   Sexual activity: Not Currently    Birth control/protection: Abstinence  Other Topics Concern   Not on file  Social History Narrative   Works for [employer], has Associate Degree   3 children   Right handed    Social Drivers of Corporate Investment Banker Strain: Low Risk  (06/28/2023)   Overall Financial Resource Strain (CARDIA)    Difficulty of Paying Living Expenses: Not very hard  Food Insecurity: No Food Insecurity (06/28/2023)   Hunger Vital Sign    Worried About Running Out of Food in the Last Year: Never true    Ran Out of Food in the Last Year: Never true  Transportation Needs: No Transportation Needs (06/28/2023)   PRAPARE - Administrator, Civil Service (Medical): No    Lack of Transportation (Non-Medical): No  Physical Activity: Insufficiently Active (06/28/2023)   Exercise Vital Sign    Days of Exercise per Week: 2 days    Minutes of Exercise per Session: 10 min  Stress: No Stress Concern Present (06/28/2023)   [employer] of Occupational Health - Occupational Stress Questionnaire    Feeling of Stress: Only a little  Social Connections: Moderately Isolated (06/28/2023)   Social Connection and Isolation Panel    Frequency of Communication with Friends and Family: Three times a week    Frequency of Social Gatherings with Friends and Family: Once a week    Attends Religious Services: 1 to 4 times per year    Active Member of [employer] or Organizations: No    Attends Engineer, Structural: Not on file    Marital Status: Divorced    Allergies:  Allergies  Allergen Reactions   Doxycycline Rash and Swelling    Mouth and face   Erythromycin Shortness Of Breath   Sulfa Antibiotics  Shortness Of Breath   Emsam [Selegiline] Dermatitis    Allergic reaction to patch   Molds & Smuts Rash   Penicillins Rash   Saxenda  [Liraglutide  -Weight Management] Nausea Only    Metabolic Disorder Labs: Lab Results  Component Value Date   HGBA1C 5.4 05/27/2021   Lab Results  Component Value Date   PROLACTIN 3.6 (L) 05/27/2021   Lab Results  Component Value Date   CHOL 260 (H)  11/30/2022   TRIG 250.0 (H) 11/30/2022   HDL 51.90 11/30/2022   CHOLHDL 5 11/30/2022   VLDL 50.0 (H) 11/30/2022   LDLCALC 158 (H) 11/30/2022   LDLCALC 140 (H) 03/31/2021   Lab Results  Component Value Date   TSH 2.000 05/27/2021   TSH 2.32 03/27/2019    Therapeutic Level Labs: No results found for: LITHIUM No results found for: VALPROATE No results found for: CBMZ  Current Medications: Current Outpatient Medications  Medication Sig Dispense Refill   albuterol  (VENTOLIN  HFA) 108 (90 Base) MCG/ACT inhaler Inhale 2 puffs into the lungs every 6 (six) hours as needed for wheezing or shortness of breath. (Patient not taking: Reported on 07/26/2023) 8 g 0   bismuth  subsalicylate (PEPTO BISMOL) 262 MG chewable tablet Chew 262 mg by mouth 2 (two) times daily.     cariprazine  (VRAYLAR ) 3 MG capsule Take 1 capsule (3 mg total) by mouth daily. 90 capsule 1   diclofenac Sodium (VOLTAREN) 1 % GEL Apply 2 g topically as needed.     EPINEPHrine  (EPIPEN  2-PAK) 0.3 mg/0.3 mL IJ SOAJ injection Inject 0.3 mLs (0.3 mg total) into the muscle as needed for anaphylaxis. 1 each 1   fluticasone  (FLONASE ) 50 MCG/ACT nasal spray Place 2 sprays into both nostrils daily as needed for allergies or rhinitis. (Patient not taking: Reported on 07/26/2023) 16 g 5   hydrOXYzine  (VISTARIL ) 25 MG capsule Take 1-2 capsules (25-50 mg total) by mouth at bedtime as needed. For sleep (Patient not taking: Reported on 07/26/2023) 60 capsule 1   ipratropium (ATROVENT ) 0.03 % nasal spray Place 1 spray into both nostrils 4 (four) times  daily. Use as directed. 30 mL 5   levocetirizine (XYZAL ) 5 MG tablet Take 1 tablet (5 mg total) by mouth every evening. 30 tablet 5   losartan -hydrochlorothiazide (HYZAAR) 50-12.5 MG tablet TAKE 1 TABLET BY MOUTH DAILY 90 tablet 1   mesalamine  (LIALDA ) 1.2 g EC tablet TAKE FOUR TABLETS BY MOUTH DAILY WITH BREAKFAST 120 tablet 2   mupirocin  ointment (BACTROBAN ) 2 % Apply 1 Application topically 2 (two) times daily. 22 g 0   propranolol  (INDERAL ) 10 MG tablet Take 1 tablet (10 mg total) by mouth 3 (three) times daily as needed. For anxiety (Patient not taking: Reported on 07/26/2023) 90 tablet 1   semaglutide -weight management (WEGOVY ) 0.5 MG/0.5ML SOAJ SQ injection Inject 0.5 mg into the skin once a week. 2 mL 0   sertraline  (ZOLOFT ) 100 MG tablet TAKE 1 TABLET BY MOUTH DAILY WITH BREAKFAST 90 tablet 1   No current facility-administered medications for this visit.     Musculoskeletal: Strength & Muscle Tone: within normal limits Gait & Station: normal Patient leans: N/A  Psychiatric Specialty Exam: Review of Systems  Psychiatric/Behavioral: Negative.      Blood pressure 129/82, pulse 70, temperature (!) 97.1 F (36.2 C), temperature source Temporal, height 5' 6.75 (1.695 m), weight 281 lb 12.8 oz (127.8 kg).Body mass index is 44.47 kg/m.  General Appearance: Casual  Eye Contact:  Fair  Speech:  Clear and Coherent  Volume:  Normal  Mood:  Euthymic  Affect:  Congruent  Thought Process:  Goal Directed and Descriptions of Associations: Intact  Orientation:  Full (Time, Place, and Person)  Thought Content: Logical   Suicidal Thoughts:  No  Homicidal Thoughts:  No  Memory:  Immediate;   Fair Recent;   Fair Remote;   Fair  Judgement:  Fair  Insight:  Fair  Psychomotor Activity:  Normal  Concentration:  Concentration: Fair and Attention Span: Fair  Recall:  [employer] of Knowledge: Fair  Language: Fair  Akathisia:  No  Handed:  Right  AIMS (if indicated): done  Assets:   Communication Skills Desire for Improvement Housing Social Support Transportation  ADL's:  Intact  Cognition: WNL  Sleep:  Fair   Screenings: Midwife Visit from 11/09/2023 in [facility] Office Visit from 08/10/2023 in [facility] Office Visit from 09/02/2022 in [facility] Office Visit from 06/01/2022 in [facility] Office Visit from 03/04/2022 in [facility]  AIMS Total Score 0 0 0 0 0   GAD-7    Flowsheet Row Office Visit from 11/09/2023 in [facility] Office Visit from 08/10/2023 in [facility] Office Visit from 06/28/2023 in [facility] [place] Office Visit from 02/02/2023 in [facility] [facility] Visit from 11/30/2022 in [facility] [place] [place]  Total GAD-7 Score 5 0 5 5 5    PHQ2-9    Flowsheet Row Office Visit from 11/09/2023 in [facility] Office Visit from 08/10/2023 in [facility] Office Visit from 06/28/2023 in [facility] [place] Office Visit from 02/02/2023 in [facility] [employer] Visit from 11/30/2022 in [facility] [place]  PHQ-2 Total Score 2 1 2 4 4   PHQ-9 Total Score 5 -- 7 13 11    Flowsheet Row Office Visit from 11/09/2023 in [facility] Office Visit from 08/10/2023 in [facility] Video Visit from 05/13/2023 in [facility]  C-SSRS RISK CATEGORY Moderate Risk Moderate Risk No Risk      Assessment and Plan: Leslie Douglas is a 58 year old Caucasian female who has a history of depression, binge eating disorder, obstructive sleep apnea, borderline personality disorder was evaluated in office today for a follow-up appointment.  Discussed assessment and plan as noted below.  1. Recurrent major depressive disorder, in full remission Currently well-managed on current medication regimen. Continue Vraylar  3 mg daily.  Plans to taper it off in the future. Continue Sertraline  100 mg daily Continue CBT with therapist at [facility] counseling.  2. GAD (generalized anxiety disorder)-stable Denies any anxiety symptoms at this time. Continue Sertraline  as prescribed Continue Propranolol  10 mg 3 times a day as needed Continue Hydroxyzine  25 mg - 50 mg as needed  3. Mild binge-eating disorder-stable Currently on Wegovy , working on weight loss. Continue CBT.  4. Borderline personality disorder (HCC)-stable Currently well-managed. Continue CBT/DBT  5. OSA (obstructive sleep apnea)-improving Currently reports sleep is good although continues to have CPAP mask trouble. Has upcoming appointment with neurology and plans to discuss this.  Will benefit from repeating labs including TSH, vitamin B12 level, vitamin D , prolactin, hemoglobin A1c, lipid panel, LFT, sodium, platelet count.  Has upcoming appointment with primary care provider.  If unable to get these labs repeated will consider ordering it at next visit.  Follow-up Follow-up in clinic in 3 months or sooner if needed.    Collaboration of Care: Collaboration of Care: Referral or follow-up with counselor/therapist AEB encouraged to continue psychotherapy sessions.  Patient/Guardian was advised Release of  Information must be obtained prior to any record release in order to collaborate their care with an outside provider. Patient/Guardian was advised if they have not already done so to contact the registration department to sign  all necessary forms in order for us  to release information regarding their care.   Consent: Patient/Guardian gives verbal consent for treatment and assignment of benefits for services provided during this visit. Patient/Guardian expressed understanding and agreed to proceed.   This note was generated in part or whole with voice recognition software. Voice recognition is usually quite accurate but there are transcription errors that can and very often do occur. I apologize for any typographical errors that were not detected and corrected.    Isel Skufca, MD 11/09/2023, 8:57 AM

## 2023-11-28 ENCOUNTER — Ambulatory Visit: Admitting: Adult Health

## 2023-12-12 ENCOUNTER — Telehealth: Payer: Self-pay

## 2023-12-12 ENCOUNTER — Ambulatory Visit: Admitting: Physician Assistant

## 2023-12-12 ENCOUNTER — Other Ambulatory Visit (HOSPITAL_COMMUNITY): Payer: Self-pay

## 2023-12-12 ENCOUNTER — Encounter: Payer: Self-pay | Admitting: Physician Assistant

## 2023-12-12 VITALS — BP 124/78 | HR 82 | Temp 97.2°F | Ht 66.75 in | Wt 278.5 lb

## 2023-12-12 DIAGNOSIS — G4733 Obstructive sleep apnea (adult) (pediatric): Secondary | ICD-10-CM

## 2023-12-12 DIAGNOSIS — Z0000 Encounter for general adult medical examination without abnormal findings: Secondary | ICD-10-CM

## 2023-12-12 DIAGNOSIS — E559 Vitamin D deficiency, unspecified: Secondary | ICD-10-CM

## 2023-12-12 DIAGNOSIS — K5100 Ulcerative (chronic) pancolitis without complications: Secondary | ICD-10-CM

## 2023-12-12 DIAGNOSIS — E669 Obesity, unspecified: Secondary | ICD-10-CM

## 2023-12-12 DIAGNOSIS — Z23 Encounter for immunization: Secondary | ICD-10-CM

## 2023-12-12 DIAGNOSIS — F3342 Major depressive disorder, recurrent, in full remission: Secondary | ICD-10-CM

## 2023-12-12 LAB — COMPREHENSIVE METABOLIC PANEL WITH GFR
ALT: 29 U/L (ref 0–35)
AST: 27 U/L (ref 0–37)
Albumin: 4.3 g/dL (ref 3.5–5.2)
Alkaline Phosphatase: 57 U/L (ref 39–117)
BUN: 14 mg/dL (ref 6–23)
CO2: 31 meq/L (ref 19–32)
Calcium: 9.7 mg/dL (ref 8.4–10.5)
Chloride: 102 meq/L (ref 96–112)
Creatinine, Ser: 0.94 mg/dL (ref 0.40–1.20)
GFR: 65.94 mL/min (ref >60.00)
Glucose, Bld: 88 mg/dL (ref 70–99)
Potassium: 4 meq/L (ref 3.5–5.1)
Sodium: 140 meq/L (ref 135–145)
Total Bilirubin: 0.6 mg/dL (ref 0.2–1.2)
Total Protein: 7.2 g/dL (ref 6.0–8.3)

## 2023-12-12 LAB — CBC WITH DIFFERENTIAL/PLATELET
Basophils Absolute: 0 K/uL (ref 0.0–0.1)
Basophils Relative: 0.4 % (ref 0.0–3.0)
Eosinophils Absolute: 0.1 K/uL (ref 0.0–0.7)
Eosinophils Relative: 1.6 % (ref 0.0–5.0)
HCT: 39.5 % (ref 36.0–46.0)
Hemoglobin: 13.4 g/dL (ref 12.0–15.0)
Lymphocytes Relative: 39.7 % (ref 12.0–46.0)
Lymphs Abs: 2.4 K/uL (ref 0.7–4.0)
MCHC: 34 g/dL (ref 30.0–36.0)
MCV: 90.2 fl (ref 78.0–100.0)
Monocytes Absolute: 0.3 K/uL (ref 0.1–1.0)
Monocytes Relative: 4.4 % (ref 3.0–12.0)
Neutro Abs: 3.3 K/uL (ref 1.4–7.7)
Neutrophils Relative %: 53.9 % (ref 43.0–77.0)
Platelets: 321 K/uL (ref 150.0–400.0)
RBC: 4.38 Mil/uL (ref 3.87–5.11)
RDW: 13.6 % (ref 11.5–15.5)
WBC: 6.1 K/uL (ref 4.0–10.5)

## 2023-12-12 LAB — LIPID PANEL
Cholesterol: 242 mg/dL — ABNORMAL HIGH (ref 0–200)
HDL: 48.6 mg/dL (ref >39.00)
LDL Cholesterol: 153 mg/dL — ABNORMAL HIGH (ref 0–99)
NonHDL: 193.07
Total CHOL/HDL Ratio: 5
Triglycerides: 198 mg/dL — ABNORMAL HIGH (ref 0.0–149.0)
VLDL: 39.6 mg/dL (ref 0.0–40.0)

## 2023-12-12 LAB — VITAMIN D 25 HYDROXY (VIT D DEFICIENCY, FRACTURES): VITD: 16.96 ng/mL — ABNORMAL LOW (ref 30.00–100.00)

## 2023-12-12 LAB — VITAMIN B12: Vitamin B-12: 276 pg/mL (ref 211–911)

## 2023-12-12 MED ORDER — ZEPBOUND 2.5 MG/0.5ML ~~LOC~~ SOAJ: 2.5000 mg | SUBCUTANEOUS | 2 refills | Status: AC

## 2023-12-12 MED ORDER — CARIPRAZINE HCL 3 MG PO CAPS: 3.0000 mg | ORAL_CAPSULE | Freq: Every day | ORAL | Status: AC

## 2023-12-12 NOTE — Progress Notes (Signed)
 Subjective:    Leslie Douglas is a 58 y.o. female and is here for a comprehensive physical exam.  HPI  Health Maintenance Due  Topic Date Due   Zoster Vaccines- Shingrix (1 of 2) Never done   Pneumococcal Vaccine: 50+ Years (3 of 3 - PCV20 or PCV21) 08/08/2023   Influenza Vaccine  08/12/2023    Discussed the use of AI scribe software for clinical note transcription with the patient, who gave verbal consent to proceed.  History of Present Illness     Health Maintenance: Immunizations -- received flu and pneumonia today; will wait on shingles Colonoscopy -- UpToDate  Mammogram -- overdue -- counseled PAP -- UpToDate  Bone Density -- N/A  Diet -- working towards low calorie diet Exercise -- limited due to colder weather  Sleep habits -- no major concerns Mood -- stable  UTD with dentist? - yes UTD with eye doctor? - yes  Weight history: Wt Readings from Last 10 Encounters:  12/12/23 278 lb 8 oz (126.3 kg)  07/26/23 285 lb 9.6 oz (129.5 kg)  07/08/23 281 lb 12.8 oz (127.8 kg)  06/28/23 289 lb 4 oz (131.2 kg)  06/13/23 287 lb 8 oz (130.4 kg)  05/10/23 300 lb (136.1 kg)  04/05/23 (!) 314 lb 6.4 oz (142.6 kg)  02/02/23 (!) 314 lb (142.4 kg)  11/30/22 (!) 314 lb 8 oz (142.7 kg)  08/03/22 (!) 307 lb 9.6 oz (139.5 kg)   Body mass index is 43.95 kg/m. No LMP recorded. Patient is postmenopausal.  Alcohol use:  reports that she does not currently use alcohol.  Tobacco use:  Tobacco Use: Low Risk  (12/12/2023)   Patient History    Smoking Tobacco Use: Never    Smokeless Tobacco Use: Never    Passive Exposure: Not on file   Eligible for lung cancer screening? no     11/09/2023    8:54 AM  Depression screen PHQ 2/9  Decreased Interest   Down, Depressed, Hopeless   PHQ - 2 Score   Altered sleeping   Tired, decreased energy   Change in appetite   Feeling bad or failure about yourself    Trouble concentrating   Moving slowly or fidgety/restless   Suicidal  thoughts   PHQ-9 Score      Information is confidential and restricted. Go to Review Flowsheets to unlock data.     Other providers/specialists: Patient Care Team: Job Lukes, [place] as PCP - General (Physician Assistant) Court Dorn PARAS, MD as PCP - Cardiology (Cardiology) Iva Marty Saltness, MD as Consulting Physician (Allergy  and Immunology) Mansouraty, Aloha Raddle., MD as Consulting Physician (Gastroenterology) Georjean Darice HERO, MD as Consulting Physician (Neurology) Court Dorn PARAS, MD as Consulting Physician (Cardiology)    PMHx, SurgHx, SocialHx, Medications, and Allergies were reviewed in the Visit Navigator and updated as appropriate.   Past Medical History:  Diagnosis Date   Allergy     Anxiety    Arthritis    Back pain    Binge eating disorder    Borderline personality disorder (HCC)    COPD (chronic obstructive pulmonary disease) (HCC)    chronic bronchitis   Depression    Edema, lower extremity    Essential hypertension 04/05/2023   Hip pain    History of chicken pox    Hyperlipidemia 04/05/2023   Joint pain    Memory loss    Pancolitis    Poor concentration    Seasonal allergies    Skin cancer, basal cell  basal cell   Tinnitus    Vitamin D  deficiency      Past Surgical History:  Procedure Laterality Date   CHOLECYSTECTOMY  2003   COLONOSCOPY     MOHS SURGERY  10/2017   SKIN SURGERY  05/2020   nose skin cancer   VAGINAL DELIVERY  1995   WISDOM TOOTH EXTRACTION  12/2017     Family History  Problem Relation Age of Onset   Hypertension Mother    Diabetes Father    Colon cancer Neg Hx    Esophageal cancer Neg Hx    Rectal cancer Neg Hx    Stomach cancer Neg Hx    Pancreatic cancer Neg Hx    Inflammatory bowel disease Neg Hx    Liver disease Neg Hx    Allergic rhinitis Neg Hx    Angioedema Neg Hx    Asthma Neg Hx    Eczema Neg Hx    Urticaria Neg Hx    Immunodeficiency Neg Hx    Atopy Neg Hx    Mental illness Neg Hx     Sleep apnea Neg Hx     Social History   Tobacco Use   Smoking status: Never   Smokeless tobacco: Never  Vaping Use   Vaping status: Never Used  Substance Use Topics   Alcohol use: Not Currently    Comment: seldom (alcohol intolerance?)   Drug use: Never    Review of Systems:   Review of Systems  Constitutional:  Negative for chills, fever, malaise/fatigue and weight loss.  HENT:  Negative for hearing loss, sinus pain and sore throat.   Respiratory:  Negative for cough and hemoptysis.   Cardiovascular:  Negative for chest pain, palpitations, leg swelling and PND.  Gastrointestinal:  Negative for abdominal pain, constipation, diarrhea, heartburn, nausea and vomiting.  Genitourinary:  Negative for dysuria, frequency and urgency.  Musculoskeletal:  Negative for back pain, myalgias and neck pain.  Skin:  Negative for itching and rash.  Neurological:  Negative for dizziness, tingling, seizures and headaches.  Endo/Heme/Allergies:  Negative for polydipsia.  Psychiatric/Behavioral:  Negative for depression. The patient is not nervous/anxious.     Objective:   BP 124/78 (BP Location: Left Arm, Patient Position: Sitting, Cuff Size: Large)   Pulse 82   Temp (!) 97.2 F (36.2 C) (Temporal)   Ht 5' 6.75 (1.695 m)   Wt 278 lb 8 oz (126.3 kg)   SpO2 98%   BMI 43.95 kg/m  Body mass index is 43.95 kg/m.   General Appearance:    Alert, cooperative, no distress, appears stated age  Head:    Normocephalic, without obvious abnormality, atraumatic  Eyes:    PERRL, conjunctiva/corneas clear, EOM's intact, fundi    benign, both eyes  Ears:    Normal TM's and external ear canals, both ears  Nose:   Nares normal, septum midline, mucosa normal, no drainage    or sinus tenderness  Throat:   Lips, mucosa, and tongue normal; teeth and gums normal  Neck:   Supple, symmetrical, trachea midline, no adenopathy;    thyroid :  no enlargement/tenderness/nodules; no carotid   bruit or JVD  Back:      Symmetric, no curvature, ROM normal, no CVA tenderness  Lungs:     Clear to auscultation bilaterally, respirations unlabored  Chest Wall:    No tenderness or deformity   Heart:    Regular rate and rhythm, S1 and S2 normal, no murmur, rub or gallop  Breast Exam:  Deferred  Abdomen:     Soft, non-tender, bowel sounds active all four quadrants,    no masses, no organomegaly  Genitalia:    Deferred   Extremities:   Extremities normal, atraumatic, no cyanosis or edema  Pulses:   2+ and symmetric all extremities  Skin:   Skin color, texture, turgor normal, no rashes or lesions  Lymph nodes:   Cervical, supraclavicular, and axillary nodes normal  Neurologic:   CNII-XII intact, normal strength, sensation and reflexes    throughout    Assessment/Plan:   Assessment and Plan    Adult Wellness Visit Routine wellness visit. Mammogram due. Vaccinations for flu and pneumonia due. Shingles vaccine administered. - Administered flu and pneumonia vaccines today. - Schedule mammogram. - Encouraged regular exercise.  Obesity Previous Wegovy  use discontinued. Discussed Zepbound as alternative with better tolerance and weight loss data. Zepbound may aid in insurance approval due to FDA indication for severe obstructive sleep apnea. - Submitted prescription for Zepbound and checked insurance coverage.  Obstructive Sleep Apnea, Severe Severe obstructive sleep apnea. Zepbound may aid in insurance approval due to FDA indication. - Submitted prescription for Zepbound under diagnosis of obstructive sleep apnea.  Depression in Remission Depression in remission with Vraylar  and Zoloft . Good mental health status reported. Hydroxyzine  available as needed. Management per psychiatry. - Continue Vraylar  and Zoloft . - Use propranolol  as needed.  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        Lucie Buttner, PA-C [facility]

## 2023-12-12 NOTE — Addendum Note (Signed)
 Addended by: JACKQUELYN CLOTILDA HERO on: 12/12/2023 09:33 AM   Modules accepted: Orders

## 2023-12-12 NOTE — Telephone Encounter (Signed)
 Pharmacy Patient Advocate Encounter   Received notification from Onbase that prior authorization for Zepbound 2.5MG /0.5ML pen-injectors is required/requested.   Insurance verification completed.   The patient is insured through [employer] [facility] [employer]   Per test claim: PA required; PA submitted to above mentioned insurance via Latent Key/confirmation #/EOC [id number] Status is pending

## 2023-12-13 ENCOUNTER — Ambulatory Visit: Payer: Self-pay | Admitting: Physician Assistant

## 2023-12-13 MED ORDER — VITAMIN D (ERGOCALCIFEROL) 1.25 MG (50000 UNIT) PO CAPS: 50000.0000 [IU] | ORAL_CAPSULE | ORAL | 0 refills | Status: AC

## 2023-12-13 NOTE — Telephone Encounter (Signed)
 Please see message from PA Team and advise.

## 2023-12-13 NOTE — Telephone Encounter (Signed)
 Prior Authorization form/request asks a question that requires your assistance. Please see the question below and advise accordingly. The PA will not be submitted until the necessary information is received.  PLEASE BE ADVISED INSURANCE PREFERS WEGOVY . NEED DOCUMENTATION IN CHART OF INADEQUATE TREATMENT RESPONSE IF YOU WOULD LIKE TO PROCEED PLEASE ASSIST WITH THIS. QUESTION BELOW

## 2023-12-23 NOTE — Telephone Encounter (Signed)
 Pharmacy Patient Advocate Encounter  Received notification from [facility] that Prior Authorization for Zepbound  2.5MG /0.5ML pen-injectors  has been DENIED.  See denial reason below. No denial letter attached in CMM. Will attach denial letter to Media tab once received.   PA #/Case ID/Reference #: [id number]    Your plan only covers this drug when you meet one of these options: A) You have tried one other drug your plan covers (preferred drug), and it did not work well for you, or B) Your doctor gives us  a medical reason you cannot take the preferred drug. For your plan, you need to try one preferred drug. We have denied your request for this drug because your doctor told us  that you will be taking a preferred drug instead. Your doctor can send us  any new or missing information for us  to review. The preferred drug for your plan is: Wegovy . Your doctor may need to get approval from your plan for preferred drugs. For this drug, you may have to meet other criteria. You can request the drug policy for more details. You can also request other plan documents for your review

## 2023-12-26 NOTE — Telephone Encounter (Signed)
 Bernice, please see message from [place] and resubmit PA for Zepbound .

## 2023-12-27 ENCOUNTER — Telehealth: Payer: Self-pay

## 2023-12-27 ENCOUNTER — Other Ambulatory Visit (HOSPITAL_COMMUNITY): Payer: Self-pay

## 2023-12-27 NOTE — Telephone Encounter (Signed)
 Pharmacy Patient Advocate Encounter   Received notification from Pt Calls Messages that prior authorization for Zepbound  2.5MG /0.5ML pen-injectors is required/requested.   Insurance verification completed.   The patient is insured through [facility].   Per test claim: PA required; PA submitted to above mentioned insurance via Latent Key/confirmation #/EOC [id number] Status is pending

## 2024-01-19 ENCOUNTER — Other Ambulatory Visit (HOSPITAL_COMMUNITY): Payer: Self-pay

## 2024-02-07 ENCOUNTER — Encounter: Payer: Self-pay | Admitting: Psychiatry

## 2024-02-07 ENCOUNTER — Telehealth: Admitting: Psychiatry

## 2024-02-07 DIAGNOSIS — F5081 Binge eating disorder, mild: Secondary | ICD-10-CM

## 2024-02-07 DIAGNOSIS — F603 Borderline personality disorder: Secondary | ICD-10-CM | POA: Diagnosis not present

## 2024-02-07 DIAGNOSIS — F3342 Major depressive disorder, recurrent, in full remission: Secondary | ICD-10-CM

## 2024-02-07 DIAGNOSIS — F411 Generalized anxiety disorder: Secondary | ICD-10-CM

## 2024-02-07 DIAGNOSIS — G4733 Obstructive sleep apnea (adult) (pediatric): Secondary | ICD-10-CM | POA: Diagnosis not present

## 2024-02-07 NOTE — Progress Notes (Signed)
 Virtual Visit via Video Note  I connected with Leslie Douglas on 02/07/24 at 11:20 AM EST by a video enabled telemedicine application and verified that I am speaking with the correct person using two identifiers.  Location Provider Location : [facility] Patient Location : Home  Participants: Patient , Provider   I discussed the limitations of evaluation and management by telemedicine and the availability of in person appointments. The patient expressed understanding and agreed to proceed.   I discussed the assessment and treatment plan with the patient. The patient was provided an opportunity to ask questions and all were answered. The patient agreed with the plan and demonstrated an understanding of the instructions.   The patient was advised to call back or seek an in-person evaluation if the symptoms worsen or if the condition fails to improve as anticipated.    [facility] MD OP Progress Note  02/07/2024 1:13 PM Leslie Douglas  MRN:  [id number]  Chief Complaint:  Chief Complaint  Patient presents with   Medication Refill   Follow-up   Depression   Anxiety   HPI: Leslie Douglas is a 59 year old Caucasian female, employed, separated, lives in [place], has a history of MDD, GAD, binge eating disorder, borderline personality disorder was evaluated by telemedicine today.  She reports doing well overall on her current medication regimen, which includes Vraylar  3 mg daily and sertraline  100 mg daily. She reports a mild dip in mood recently, which she associates with seasonal changes, noting a similar pattern last year. She finds daily functioning, including self-care, home responsibilities, and work, manageable without significant difficulty, even with the mood dip. To help manage her mood, she identifies coping strategies such as staying hydrated, using a treadmill for exercise, and getting out of the house.  She reports discussing recent mood changes with her therapist.  She reports psychotherapy  sessions are still beneficial.  She denies any thoughts of hurting herself or others, denies any wish to be dead, and denies recent self-injurious behaviors.  She denies any perceptual disturbances.  She describes sleeping well and uses melatonin 5 mg at night, which she finds effective. She continues to experience issues with her CPAP mask related to sleep apnea, as the mask seal breaks frequently due to her sleep position and drooling, which disrupts her sleep. She has not used the CPAP and is awaiting a rescheduled appointment to address the mask issue. Currently, she reports sleeping fine.  She was able to get her labs completed and was diagnosed with vitamin D  and vitamin B12 deficiency.  She is currently on vitamin D  and B12 replacement.  She also has hyperlipidemia and is currently managing her diet to address that.   Visit Diagnosis:    ICD-10-CM   1. Recurrent major depressive disorder, in full remission  F33.42     2. GAD (generalized anxiety disorder)  F41.1     3. Mild binge-eating disorder  F50.810     4. Borderline personality disorder (HCC)  F60.3     5. OSA (obstructive sleep apnea)  G47.33       Past Psychiatric History: I have reviewed past psychiatric history from progress note on 05/19/2021.  Past Medical History:  Past Medical History:  Diagnosis Date   Allergy     Anxiety    Arthritis    Back pain    Binge eating disorder    Borderline personality disorder (HCC)    COPD (chronic obstructive pulmonary disease) (HCC)    chronic bronchitis   Depression  Edema, lower extremity    Essential hypertension 04/05/2023   Hip pain    History of chicken pox    Hyperlipidemia 04/05/2023   Joint pain    Memory loss    Pancolitis    Poor concentration    Seasonal allergies    Skin cancer, basal cell    basal cell   Tinnitus    Vitamin D  deficiency     Past Surgical History:  Procedure Laterality Date   CHOLECYSTECTOMY     COLONOSCOPY     MOHS SURGERY   10/2017   SKIN SURGERY  05/2020   nose skin cancer   VAGINAL DELIVERY  1995   WISDOM TOOTH EXTRACTION  12/2017    Family Psychiatric History: I have reviewed family psychiatric history from progress note on 05/19/2021.  Family History:  Family History  Problem Relation Age of Onset   Hypertension Mother    Diabetes Father    Colon cancer Neg Hx    Esophageal cancer Neg Hx    Rectal cancer Neg Hx    Stomach cancer Neg Hx    Pancreatic cancer Neg Hx    Inflammatory bowel disease Neg Hx    Liver disease Neg Hx    Allergic rhinitis Neg Hx    Angioedema Neg Hx    Asthma Neg Hx    Eczema Neg Hx    Urticaria Neg Hx    Immunodeficiency Neg Hx    Atopy Neg Hx    Mental illness Neg Hx    Sleep apnea Neg Hx     Social History: I have reviewed social history from progress note on 05/19/2021. Social History   Socioeconomic History   Marital status: Divorced    Spouse name: Not on file   Number of children: 3   Years of education: Not on file   Highest education level: Associate degree: academic program  Occupational History   Occupation: Aeronautical Engineer    Comment: [employer]  Tobacco Use   Smoking status: Never   Smokeless tobacco: Never  Vaping Use   Vaping status: Never Used  Substance and Sexual Activity   Alcohol use: Not Currently    Comment: seldom (alcohol intolerance?)   Drug use: Never   Sexual activity: Not Currently    Birth control/protection: Abstinence  Other Topics Concern   Not on file  Social History Narrative   Works for [employer], has Associate Degree   3 children   Right handed    Social Drivers of Health   Tobacco Use: Low Risk (02/07/2024)   Patient History    Smoking Tobacco Use: Never    Smokeless Tobacco Use: Never    Passive Exposure: Not on file  Financial Resource Strain: Low Risk (06/28/2023)   Overall Financial Resource Strain (CARDIA)    Difficulty of Paying Living Expenses: Not very hard  Food Insecurity: No Food  Insecurity (06/28/2023)   Epic    Worried About Programme Researcher, Broadcasting/film/video in the Last Year: Never true    Ran Out of Food in the Last Year: Never true  Transportation Needs: No Transportation Needs (06/28/2023)   Epic    Lack of Transportation (Medical): No    Lack of Transportation (Non-Medical): No  Physical Activity: Insufficiently Active (06/28/2023)   Exercise Vital Sign    Days of Exercise per Week: 2 days    Minutes of Exercise per Session: 10 min  Stress: No Stress Concern Present (06/28/2023)   [employer] of Occupational Health -  Occupational Stress Questionnaire    Feeling of Stress: Only a little  Social Connections: Moderately Isolated (06/28/2023)   Social Connection and Isolation Panel    Frequency of Communication with Friends and Family: Three times a week    Frequency of Social Gatherings with Friends and Family: Once a week    Attends Religious Services: 1 to 4 times per year    Active Member of Clubs or Organizations: No    Attends Engineer, Structural: Not on file    Marital Status: Divorced  Depression (PHQ2-9): Medium Risk (11/09/2023)   Depression (PHQ2-9)    PHQ-2 Score: 5  Alcohol Screen: Low Risk (06/28/2023)   Alcohol Screen    Last Alcohol Screening Score (AUDIT): 1  Housing: Low Risk (06/28/2023)   Epic    Unable to Pay for Housing in the Last Year: No    Number of Times Moved in the Last Year: 0    Homeless in the Last Year: No  Utilities: Not on file  Health Literacy: Not on file    Allergies: Allergies[1]  Metabolic Disorder Labs: Lab Results  Component Value Date   HGBA1C 5.4 05/27/2021   Lab Results  Component Value Date   PROLACTIN 3.6 (L) 05/27/2021   Lab Results  Component Value Date   CHOL 242 (H) 12/12/2023   TRIG 198.0 (H) 12/12/2023   HDL 48.60 12/12/2023   CHOLHDL 5 12/12/2023   VLDL 39.6 12/12/2023   LDLCALC 153 (H) 12/12/2023   LDLCALC 158 (H) 11/30/2022   Lab Results  Component Value Date   TSH 2.000  05/27/2021   TSH 2.32 03/27/2019    Therapeutic Level Labs: No results found for: LITHIUM No results found for: VALPROATE No results found for: CBMZ  Current Medications: Current Outpatient Medications  Medication Sig Dispense Refill   cariprazine  (VRAYLAR ) 3 MG capsule Take 1 capsule (3 mg total) by mouth daily.     EPINEPHrine  (EPIPEN  2-PAK) 0.3 mg/0.3 mL IJ SOAJ injection Inject 0.3 mLs (0.3 mg total) into the muscle as needed for anaphylaxis. 1 each 1   hydrOXYzine  (VISTARIL ) 25 MG capsule Take 1-2 capsules (25-50 mg total) by mouth at bedtime as needed. For sleep 60 capsule 1   mesalamine  (LIALDA ) 1.2 g EC tablet TAKE FOUR TABLETS BY MOUTH DAILY WITH BREAKFAST 120 tablet 2   propranolol  (INDERAL ) 10 MG tablet Take 1 tablet (10 mg total) by mouth 3 (three) times daily as needed. For anxiety 90 tablet 1   sertraline  (ZOLOFT ) 100 MG tablet TAKE 1 TABLET BY MOUTH DAILY WITH BREAKFAST 90 tablet 1   tirzepatide  (ZEPBOUND ) 2.5 MG/0.5ML Pen Inject 2.5 mg into the skin once a week. 2 mL 2   Vitamin D , Ergocalciferol , (DRISDOL ) 1.25 MG (50000 UNIT) CAPS capsule Take 1 capsule (50,000 Units total) by mouth every 7 (seven) days. 12 capsule 0   No current facility-administered medications for this visit.     Musculoskeletal: Strength & Muscle Tone: UTA Gait & Station: Seated Patient leans: N/A  Psychiatric Specialty Exam: Review of Systems  Psychiatric/Behavioral:         Seasonal sadness, improving    There were no vitals taken for this visit.There is no height or weight on file to calculate BMI.  General Appearance: Fairly Groomed  Eye Contact:  Fair  Speech:  Clear and Coherent  Volume:  Normal  Mood:  some seasonal sadness but managing well  Affect:  Congruent  Thought Process:  Goal Directed and Descriptions of Associations:  Intact  Orientation:  Full (Time, Place, and Person)  Thought Content: WDL   Suicidal Thoughts:  No  Homicidal Thoughts:  No  Memory:   Immediate;   Fair Recent;   Fair Remote;   Fair  Judgement:  Good  Insight:  Good  Psychomotor Activity:  Normal  Concentration:  Concentration: Fair and Attention Span: Fair  Recall:  [employer] of Knowledge: Fair  Language: Fair  Akathisia:  No  Handed:  Right  AIMS (if indicated): not done  Assets:  Manufacturing Systems Engineer Desire for Improvement Housing Social Support Transportation  ADL's:  Intact  Cognition: WNL  Sleep:  improving   Screenings: Geneticist, Molecular Office Visit from 11/09/2023 in [facility] Office Visit from 08/10/2023 in [facility] Office Visit from 09/02/2022 in [facility] Office Visit from 06/01/2022 in [facility] Office Visit from 03/04/2022 in [facility]  AIMS Total Score 0 0 0 0 0   GAD-7    Flowsheet Row Office Visit from 11/09/2023 in [facility] Office Visit from 08/10/2023 in [facility] Office Visit from 06/28/2023 in [facility] [employer] from 02/02/2023 in [facility] [employer] from 11/30/2022 in [facility] [address] [place]  Total GAD-7 Score 5 0 5 5 5    [employer]    Flowsheet Row Office Visit from 11/09/2023 in [facility] Office Visit from 08/10/2023 in [facility] Office Visit from 06/28/2023 in [facility] [place] Office Visit from 02/02/2023 in [facility] [employer] Visit from 11/30/2022 in [facility] [place]  PHQ-2 Total Score 2 1 2 4 4   PHQ-9 Total Score 5 -- 7 13 11    Flowsheet Row Video  Visit from 02/07/2024 in [facility] Office Visit from 11/09/2023 in [facility] Office Visit from 08/10/2023 in [facility]  C-SSRS RISK CATEGORY Moderate Risk Moderate Risk Moderate Risk     Assessment and Plan: Leslie Douglas is a 59 year old Caucasian female who presented for a follow-up appointment, discussed assessment and plan as noted below.  1. Recurrent major depressive disorder, in full remission Currently denies any significant depression symptoms although does report seasonal patterns of low mood on and off.  She however has been managing well. Discussed light therapy with 10000 lux. Continue Vraylar  3 mg daily Continue Sertraline  100 mg daily Continue CBT with therapist at [facility] counseling.  2. GAD (generalized anxiety disorder) -stable Currently denies any significant anxiety symptoms Continue Sertraline  as prescribed 5 Continue CBT Continue Propranolol  10 mg 3 times a day as needed Continue Hydroxyzine  25 mg - 50 mg as needed  3. Mild binge-eating disorder-stable Currently not on Wegovy  and is currently waiting for Zepbound  to be approved Continue CBT  4. Borderline personality disorder (HCC)-stable Denies any concerns Continue CBT/DBT  5. OSA (obstructive sleep apnea)-improving Awaiting an appointment with pulmonologist-has CPAP mask problems.  Follow-up Follow-up in clinic in 3 months or sooner in person.    Collaboration of Care: Collaboration of Care: Referral or follow-up with counselor/therapist AEB encouraged to continue psychotherapy sessions.  Patient/Guardian was  advised Release of Information must be obtained prior to any record release in order to collaborate their care with an outside provider. Patient/Guardian was advised if they have not already done so to contact the registration department to sign all necessary forms in  order for us  to release information regarding their care.   Consent: Patient/Guardian gives verbal consent for treatment and assignment of benefits for services provided during this visit. Patient/Guardian expressed understanding and agreed to proceed.   Discussed the use of a AI scribe software for clinical note transcription with the patient, who gave verbal consent to proceed.  This note was generated in part or whole with voice recognition software. Voice recognition is usually quite accurate but there are transcription errors that can and very often do occur. I apologize for any typographical errors that were not detected and corrected.    Tionne Carelli, MD 02/07/2024, 1:13 PM     [1]  Allergies Allergen Reactions   Doxycycline Rash and Swelling    Mouth and face   Erythromycin Shortness Of Breath   Sulfa Antibiotics Shortness Of Breath   Emsam [Selegiline] Dermatitis    Allergic reaction to patch   Molds & Smuts Rash   Penicillins Rash   Saxenda  [Liraglutide  -Weight Management] Nausea Only

## 2024-03-15 ENCOUNTER — Ambulatory Visit: Admitting: Adult Health

## 2024-04-05 ENCOUNTER — Ambulatory Visit: Admitting: Psychiatry

## 2024-11-14 ENCOUNTER — Encounter: Admitting: Physician Assistant
